# Patient Record
Sex: Female | Born: 1955 | Race: White | Hispanic: No | Smoking: Former smoker
Health system: Southern US, Community
[De-identification: ages and names within clinical notes are randomized; demographics above are authoritative.]

## PROBLEM LIST (undated history)

## (undated) DIAGNOSIS — H269 Unspecified cataract: Secondary | ICD-10-CM

## (undated) DIAGNOSIS — F419 Anxiety disorder, unspecified: Secondary | ICD-10-CM

## (undated) DIAGNOSIS — G5603 Carpal tunnel syndrome, bilateral upper limbs: Secondary | ICD-10-CM

## (undated) DIAGNOSIS — K219 Gastro-esophageal reflux disease without esophagitis: Secondary | ICD-10-CM

## (undated) DIAGNOSIS — Z8711 Personal history of peptic ulcer disease: Secondary | ICD-10-CM

## (undated) DIAGNOSIS — C50919 Malignant neoplasm of unspecified site of unspecified female breast: Secondary | ICD-10-CM

## (undated) DIAGNOSIS — M545 Low back pain, unspecified: Secondary | ICD-10-CM

## (undated) DIAGNOSIS — M1712 Unilateral primary osteoarthritis, left knee: Secondary | ICD-10-CM

## (undated) DIAGNOSIS — L723 Sebaceous cyst: Secondary | ICD-10-CM

## (undated) DIAGNOSIS — Z803 Family history of malignant neoplasm of breast: Secondary | ICD-10-CM

## (undated) DIAGNOSIS — N649 Disorder of breast, unspecified: Secondary | ICD-10-CM

## (undated) DIAGNOSIS — Z9889 Other specified postprocedural states: Secondary | ICD-10-CM

## (undated) DIAGNOSIS — J189 Pneumonia, unspecified organism: Secondary | ICD-10-CM

## (undated) DIAGNOSIS — G629 Polyneuropathy, unspecified: Secondary | ICD-10-CM

## (undated) DIAGNOSIS — I10 Essential (primary) hypertension: Secondary | ICD-10-CM

## (undated) DIAGNOSIS — H2589 Other age-related cataract: Secondary | ICD-10-CM

## (undated) DIAGNOSIS — G8929 Other chronic pain: Secondary | ICD-10-CM

## (undated) DIAGNOSIS — R112 Nausea with vomiting, unspecified: Secondary | ICD-10-CM

## (undated) DIAGNOSIS — M25511 Pain in right shoulder: Secondary | ICD-10-CM

## (undated) DIAGNOSIS — M1990 Unspecified osteoarthritis, unspecified site: Secondary | ICD-10-CM

## (undated) DIAGNOSIS — Z8719 Personal history of other diseases of the digestive system: Secondary | ICD-10-CM

## (undated) DIAGNOSIS — M8580 Other specified disorders of bone density and structure, unspecified site: Secondary | ICD-10-CM

## (undated) DIAGNOSIS — Z800 Family history of malignant neoplasm of digestive organs: Secondary | ICD-10-CM

## (undated) DIAGNOSIS — M810 Age-related osteoporosis without current pathological fracture: Secondary | ICD-10-CM

## (undated) HISTORY — PX: FOOT SURGERY: SHX648

## (undated) HISTORY — DX: Disorder of breast, unspecified: N64.9

## (undated) HISTORY — DX: Age-related osteoporosis without current pathological fracture: M81.0

## (undated) HISTORY — DX: Family history of malignant neoplasm of digestive organs: Z80.0

## (undated) HISTORY — DX: Malignant neoplasm of unspecified site of unspecified female breast: C50.919

## (undated) HISTORY — PX: KNEE ARTHROSCOPY: SHX127

## (undated) HISTORY — DX: Essential (primary) hypertension: I10

## (undated) HISTORY — PX: TUBAL LIGATION: SHX77

## (undated) HISTORY — DX: Other age-related cataract: H25.89

## (undated) HISTORY — DX: Pain in right shoulder: M25.511

## (undated) HISTORY — DX: Unspecified cataract: H26.9

## (undated) HISTORY — DX: Unilateral primary osteoarthritis, left knee: M17.12

## (undated) HISTORY — DX: Carpal tunnel syndrome, bilateral upper limbs: G56.03

## (undated) HISTORY — DX: Family history of malignant neoplasm of breast: Z80.3

## (undated) HISTORY — DX: Sebaceous cyst: L72.3

---

## 1976-02-17 DIAGNOSIS — J189 Pneumonia, unspecified organism: Secondary | ICD-10-CM

## 1976-02-17 HISTORY — DX: Pneumonia, unspecified organism: J18.9

## 2000-10-07 ENCOUNTER — Ambulatory Visit (HOSPITAL_COMMUNITY): Admission: RE | Admit: 2000-10-07 | Discharge: 2000-10-07 | Payer: Self-pay | Admitting: Family Medicine

## 2000-10-07 ENCOUNTER — Encounter: Payer: Self-pay | Admitting: Family Medicine

## 2000-10-11 ENCOUNTER — Ambulatory Visit (HOSPITAL_COMMUNITY): Admission: RE | Admit: 2000-10-11 | Discharge: 2000-10-11 | Payer: Self-pay | Admitting: Family Medicine

## 2000-10-11 ENCOUNTER — Encounter: Payer: Self-pay | Admitting: Family Medicine

## 2001-03-30 ENCOUNTER — Other Ambulatory Visit: Admission: RE | Admit: 2001-03-30 | Discharge: 2001-03-30 | Payer: Self-pay | Admitting: Obstetrics and Gynecology

## 2001-03-31 ENCOUNTER — Encounter: Payer: Self-pay | Admitting: Obstetrics and Gynecology

## 2001-03-31 ENCOUNTER — Ambulatory Visit (HOSPITAL_COMMUNITY): Admission: RE | Admit: 2001-03-31 | Discharge: 2001-03-31 | Payer: Self-pay | Admitting: Obstetrics and Gynecology

## 2001-07-14 ENCOUNTER — Encounter: Payer: Self-pay | Admitting: Internal Medicine

## 2001-07-14 ENCOUNTER — Ambulatory Visit (HOSPITAL_COMMUNITY): Admission: RE | Admit: 2001-07-14 | Discharge: 2001-07-14 | Payer: Self-pay | Admitting: Internal Medicine

## 2001-10-28 ENCOUNTER — Ambulatory Visit (HOSPITAL_COMMUNITY): Admission: RE | Admit: 2001-10-28 | Discharge: 2001-10-28 | Payer: Self-pay | Admitting: Family Medicine

## 2001-10-28 ENCOUNTER — Encounter: Payer: Self-pay | Admitting: Family Medicine

## 2003-03-09 ENCOUNTER — Ambulatory Visit (HOSPITAL_COMMUNITY): Admission: RE | Admit: 2003-03-09 | Discharge: 2003-03-09 | Payer: Self-pay | Admitting: Obstetrics and Gynecology

## 2004-02-17 HISTORY — PX: BREAST BIOPSY: SHX20

## 2004-04-01 ENCOUNTER — Ambulatory Visit (HOSPITAL_COMMUNITY): Admission: RE | Admit: 2004-04-01 | Discharge: 2004-04-01 | Payer: Self-pay | Admitting: Obstetrics and Gynecology

## 2005-02-16 HISTORY — PX: PERIPHERALLY INSERTED CENTRAL CATHETER INSERTION: SHX2221

## 2005-02-16 HISTORY — PX: THROMBECTOMY / EMBOLECTOMY SUBCLAVIAN ARTERY: SUR1355

## 2005-02-16 HISTORY — PX: MASTECTOMY: SHX3

## 2005-02-25 ENCOUNTER — Encounter: Payer: Self-pay | Admitting: Obstetrics and Gynecology

## 2005-02-25 ENCOUNTER — Ambulatory Visit (HOSPITAL_COMMUNITY): Admission: RE | Admit: 2005-02-25 | Discharge: 2005-02-25 | Payer: Self-pay | Admitting: Obstetrics and Gynecology

## 2005-02-25 ENCOUNTER — Encounter (INDEPENDENT_AMBULATORY_CARE_PROVIDER_SITE_OTHER): Payer: Self-pay | Admitting: Diagnostic Radiology

## 2005-03-04 ENCOUNTER — Inpatient Hospital Stay (HOSPITAL_COMMUNITY): Admission: RE | Admit: 2005-03-04 | Discharge: 2005-03-06 | Payer: Self-pay | Admitting: General Surgery

## 2005-03-04 ENCOUNTER — Encounter (INDEPENDENT_AMBULATORY_CARE_PROVIDER_SITE_OTHER): Payer: Self-pay | Admitting: General Surgery

## 2005-03-13 ENCOUNTER — Encounter (HOSPITAL_COMMUNITY): Admission: RE | Admit: 2005-03-13 | Discharge: 2005-04-12 | Payer: Self-pay | Admitting: General Surgery

## 2005-03-13 ENCOUNTER — Ambulatory Visit (HOSPITAL_COMMUNITY): Payer: Self-pay | Admitting: Oncology

## 2005-03-16 ENCOUNTER — Encounter: Admission: RE | Admit: 2005-03-16 | Discharge: 2005-03-16 | Payer: Self-pay | Admitting: Oncology

## 2005-03-16 ENCOUNTER — Encounter (HOSPITAL_COMMUNITY): Admission: RE | Admit: 2005-03-16 | Discharge: 2005-04-15 | Payer: Self-pay | Admitting: Oncology

## 2005-03-25 ENCOUNTER — Ambulatory Visit (HOSPITAL_COMMUNITY): Admission: RE | Admit: 2005-03-25 | Discharge: 2005-03-25 | Payer: Self-pay | Admitting: Oncology

## 2005-04-17 ENCOUNTER — Encounter (HOSPITAL_COMMUNITY): Admission: RE | Admit: 2005-04-17 | Discharge: 2005-05-17 | Payer: Self-pay | Admitting: Oncology

## 2005-04-17 ENCOUNTER — Encounter: Admission: RE | Admit: 2005-04-17 | Discharge: 2005-04-17 | Payer: Self-pay | Admitting: Oncology

## 2005-05-01 ENCOUNTER — Ambulatory Visit (HOSPITAL_COMMUNITY): Payer: Self-pay | Admitting: Oncology

## 2005-05-21 ENCOUNTER — Encounter: Admission: RE | Admit: 2005-05-21 | Discharge: 2005-05-21 | Payer: Self-pay | Admitting: Oncology

## 2005-05-21 ENCOUNTER — Encounter (HOSPITAL_COMMUNITY): Admission: RE | Admit: 2005-05-21 | Discharge: 2005-06-20 | Payer: Self-pay | Admitting: Oncology

## 2005-06-16 ENCOUNTER — Ambulatory Visit (HOSPITAL_COMMUNITY): Payer: Self-pay | Admitting: Oncology

## 2005-06-17 ENCOUNTER — Inpatient Hospital Stay (HOSPITAL_COMMUNITY): Admission: AD | Admit: 2005-06-17 | Discharge: 2005-06-21 | Payer: Self-pay | Admitting: Oncology

## 2005-06-20 ENCOUNTER — Ambulatory Visit: Payer: Self-pay | Admitting: Hematology and Oncology

## 2005-06-22 ENCOUNTER — Encounter: Admission: RE | Admit: 2005-06-22 | Discharge: 2005-06-22 | Payer: Self-pay | Admitting: Oncology

## 2005-06-22 ENCOUNTER — Encounter (HOSPITAL_COMMUNITY): Admission: RE | Admit: 2005-06-22 | Discharge: 2005-07-22 | Payer: Self-pay | Admitting: Oncology

## 2005-07-20 ENCOUNTER — Ambulatory Visit: Payer: Self-pay | Admitting: Cardiology

## 2005-07-27 ENCOUNTER — Ambulatory Visit: Payer: Self-pay | Admitting: *Deleted

## 2005-08-03 ENCOUNTER — Ambulatory Visit: Payer: Self-pay | Admitting: *Deleted

## 2005-08-05 ENCOUNTER — Ambulatory Visit: Payer: Self-pay | Admitting: *Deleted

## 2005-08-12 ENCOUNTER — Encounter (HOSPITAL_COMMUNITY): Admission: RE | Admit: 2005-08-12 | Discharge: 2005-09-11 | Payer: Self-pay | Admitting: Oncology

## 2005-08-12 ENCOUNTER — Encounter: Admission: RE | Admit: 2005-08-12 | Discharge: 2005-08-12 | Payer: Self-pay | Admitting: Oncology

## 2005-08-12 ENCOUNTER — Ambulatory Visit: Payer: Self-pay | Admitting: *Deleted

## 2005-08-12 ENCOUNTER — Ambulatory Visit (HOSPITAL_COMMUNITY): Payer: Self-pay | Admitting: Oncology

## 2005-08-13 ENCOUNTER — Encounter (HOSPITAL_COMMUNITY): Admission: RE | Admit: 2005-08-13 | Discharge: 2005-09-12 | Payer: Self-pay | Admitting: Oncology

## 2005-09-16 ENCOUNTER — Encounter (HOSPITAL_COMMUNITY): Admission: RE | Admit: 2005-09-16 | Discharge: 2005-10-16 | Payer: Self-pay | Admitting: Oncology

## 2005-10-06 ENCOUNTER — Ambulatory Visit (HOSPITAL_COMMUNITY): Payer: Self-pay | Admitting: Oncology

## 2005-10-06 ENCOUNTER — Encounter: Admission: RE | Admit: 2005-10-06 | Discharge: 2005-10-06 | Payer: Self-pay | Admitting: Oncology

## 2005-10-28 ENCOUNTER — Ambulatory Visit (HOSPITAL_COMMUNITY): Admission: RE | Admit: 2005-10-28 | Discharge: 2005-10-28 | Payer: Self-pay | Admitting: Obstetrics and Gynecology

## 2005-11-16 ENCOUNTER — Ambulatory Visit (HOSPITAL_COMMUNITY): Admission: RE | Admit: 2005-11-16 | Discharge: 2005-11-16 | Payer: Self-pay | Admitting: Internal Medicine

## 2005-11-30 ENCOUNTER — Encounter (HOSPITAL_COMMUNITY): Admission: RE | Admit: 2005-11-30 | Discharge: 2005-12-30 | Payer: Self-pay | Admitting: Oncology

## 2005-11-30 ENCOUNTER — Ambulatory Visit (HOSPITAL_COMMUNITY): Payer: Self-pay | Admitting: Oncology

## 2005-11-30 ENCOUNTER — Encounter: Admission: RE | Admit: 2005-11-30 | Discharge: 2005-11-30 | Payer: Self-pay | Admitting: Oncology

## 2006-01-04 ENCOUNTER — Encounter (HOSPITAL_COMMUNITY): Admission: RE | Admit: 2006-01-04 | Discharge: 2006-02-03 | Payer: Self-pay | Admitting: Oncology

## 2006-02-16 HISTORY — PX: COLONOSCOPY: SHX174

## 2006-05-31 ENCOUNTER — Ambulatory Visit (HOSPITAL_COMMUNITY): Payer: Self-pay | Admitting: Oncology

## 2006-05-31 ENCOUNTER — Encounter (HOSPITAL_COMMUNITY): Admission: RE | Admit: 2006-05-31 | Discharge: 2006-06-30 | Payer: Self-pay | Admitting: Oncology

## 2006-11-10 ENCOUNTER — Ambulatory Visit (HOSPITAL_COMMUNITY): Admission: RE | Admit: 2006-11-10 | Discharge: 2006-11-10 | Payer: Self-pay | Admitting: Obstetrics and Gynecology

## 2006-11-10 ENCOUNTER — Other Ambulatory Visit: Admission: RE | Admit: 2006-11-10 | Discharge: 2006-11-10 | Payer: Self-pay | Admitting: Obstetrics and Gynecology

## 2006-11-29 ENCOUNTER — Encounter (HOSPITAL_COMMUNITY): Admission: RE | Admit: 2006-11-29 | Discharge: 2006-12-29 | Payer: Self-pay | Admitting: Oncology

## 2006-11-29 ENCOUNTER — Ambulatory Visit (HOSPITAL_COMMUNITY): Payer: Self-pay | Admitting: Oncology

## 2006-11-30 ENCOUNTER — Encounter (HOSPITAL_COMMUNITY): Admission: RE | Admit: 2006-11-30 | Discharge: 2006-12-30 | Payer: Self-pay | Admitting: Oncology

## 2006-12-31 ENCOUNTER — Encounter (HOSPITAL_COMMUNITY): Admission: RE | Admit: 2006-12-31 | Discharge: 2007-01-30 | Payer: Self-pay | Admitting: Oncology

## 2007-01-24 ENCOUNTER — Ambulatory Visit (HOSPITAL_COMMUNITY): Payer: Self-pay | Admitting: Oncology

## 2007-01-24 ENCOUNTER — Encounter (HOSPITAL_COMMUNITY): Admission: RE | Admit: 2007-01-24 | Discharge: 2007-02-16 | Payer: Self-pay | Admitting: Oncology

## 2007-02-14 ENCOUNTER — Ambulatory Visit: Payer: Self-pay | Admitting: Internal Medicine

## 2007-02-14 ENCOUNTER — Ambulatory Visit (HOSPITAL_COMMUNITY): Admission: RE | Admit: 2007-02-14 | Discharge: 2007-02-14 | Payer: Self-pay | Admitting: Internal Medicine

## 2007-02-16 ENCOUNTER — Ambulatory Visit (HOSPITAL_COMMUNITY): Admission: RE | Admit: 2007-02-16 | Discharge: 2007-02-16 | Payer: Self-pay | Admitting: Obstetrics & Gynecology

## 2007-03-21 ENCOUNTER — Ambulatory Visit (HOSPITAL_COMMUNITY): Admission: RE | Admit: 2007-03-21 | Discharge: 2007-03-21 | Payer: Self-pay | Admitting: Obstetrics and Gynecology

## 2007-05-11 ENCOUNTER — Ambulatory Visit: Payer: Self-pay

## 2007-05-30 ENCOUNTER — Encounter (HOSPITAL_COMMUNITY): Admission: RE | Admit: 2007-05-30 | Discharge: 2007-06-29 | Payer: Self-pay | Admitting: Oncology

## 2007-05-30 ENCOUNTER — Ambulatory Visit (HOSPITAL_COMMUNITY): Payer: Self-pay | Admitting: Oncology

## 2007-05-31 ENCOUNTER — Ambulatory Visit (HOSPITAL_COMMUNITY): Admission: RE | Admit: 2007-05-31 | Discharge: 2007-05-31 | Payer: Self-pay | Admitting: Anesthesiology

## 2007-07-20 ENCOUNTER — Ambulatory Visit (HOSPITAL_COMMUNITY): Payer: Self-pay | Admitting: Oncology

## 2007-07-20 ENCOUNTER — Encounter (HOSPITAL_COMMUNITY): Admission: RE | Admit: 2007-07-20 | Discharge: 2007-08-19 | Payer: Self-pay | Admitting: Oncology

## 2008-01-24 ENCOUNTER — Ambulatory Visit (HOSPITAL_COMMUNITY): Payer: Self-pay | Admitting: Oncology

## 2008-01-24 ENCOUNTER — Other Ambulatory Visit: Admission: RE | Admit: 2008-01-24 | Discharge: 2008-01-24 | Payer: Self-pay | Admitting: Obstetrics and Gynecology

## 2008-01-24 ENCOUNTER — Encounter (HOSPITAL_COMMUNITY): Admission: RE | Admit: 2008-01-24 | Discharge: 2008-02-14 | Payer: Self-pay | Admitting: Oncology

## 2008-03-14 ENCOUNTER — Encounter (HOSPITAL_COMMUNITY): Admission: RE | Admit: 2008-03-14 | Discharge: 2008-04-13 | Payer: Self-pay | Admitting: Oncology

## 2008-06-25 ENCOUNTER — Ambulatory Visit (HOSPITAL_COMMUNITY): Admission: RE | Admit: 2008-06-25 | Discharge: 2008-06-25 | Payer: Self-pay | Admitting: Anesthesiology

## 2008-07-24 ENCOUNTER — Ambulatory Visit (HOSPITAL_COMMUNITY): Payer: Self-pay | Admitting: Oncology

## 2008-10-24 ENCOUNTER — Encounter: Admission: RE | Admit: 2008-10-24 | Discharge: 2008-10-24 | Payer: Self-pay | Admitting: Anesthesiology

## 2009-01-21 ENCOUNTER — Encounter (HOSPITAL_COMMUNITY): Admission: RE | Admit: 2009-01-21 | Discharge: 2009-02-13 | Payer: Self-pay | Admitting: Oncology

## 2009-01-21 ENCOUNTER — Ambulatory Visit (HOSPITAL_COMMUNITY): Payer: Self-pay | Admitting: Oncology

## 2009-03-15 ENCOUNTER — Ambulatory Visit (HOSPITAL_COMMUNITY): Admission: RE | Admit: 2009-03-15 | Discharge: 2009-03-15 | Payer: Self-pay | Admitting: Anesthesiology

## 2009-03-18 ENCOUNTER — Encounter (HOSPITAL_COMMUNITY): Admission: RE | Admit: 2009-03-18 | Discharge: 2009-04-17 | Payer: Self-pay | Admitting: Oncology

## 2009-03-21 ENCOUNTER — Ambulatory Visit (HOSPITAL_COMMUNITY): Payer: Self-pay | Admitting: Oncology

## 2009-03-21 ENCOUNTER — Other Ambulatory Visit: Admission: RE | Admit: 2009-03-21 | Discharge: 2009-03-21 | Payer: Self-pay | Admitting: Obstetrics and Gynecology

## 2009-04-01 ENCOUNTER — Ambulatory Visit (HOSPITAL_COMMUNITY): Admission: RE | Admit: 2009-04-01 | Discharge: 2009-04-01 | Payer: Self-pay | Admitting: Obstetrics and Gynecology

## 2009-07-22 ENCOUNTER — Ambulatory Visit (HOSPITAL_COMMUNITY): Payer: Self-pay | Admitting: Oncology

## 2009-10-26 ENCOUNTER — Encounter: Admission: RE | Admit: 2009-10-26 | Discharge: 2009-10-26 | Payer: Self-pay | Admitting: Neurosurgery

## 2009-11-07 ENCOUNTER — Encounter (HOSPITAL_COMMUNITY): Admission: RE | Admit: 2009-11-07 | Discharge: 2010-01-17 | Payer: Self-pay | Admitting: Neurosurgery

## 2009-12-29 ENCOUNTER — Encounter: Admission: RE | Admit: 2009-12-29 | Discharge: 2009-12-29 | Payer: Self-pay | Admitting: Anesthesiology

## 2010-01-31 ENCOUNTER — Ambulatory Visit (HOSPITAL_COMMUNITY): Payer: Self-pay | Admitting: Oncology

## 2010-01-31 ENCOUNTER — Encounter (HOSPITAL_COMMUNITY)
Admission: RE | Admit: 2010-01-31 | Discharge: 2010-03-02 | Payer: Self-pay | Source: Home / Self Care | Attending: Oncology | Admitting: Oncology

## 2010-02-21 ENCOUNTER — Ambulatory Visit (HOSPITAL_COMMUNITY)
Admission: RE | Admit: 2010-02-21 | Discharge: 2010-02-21 | Payer: Self-pay | Source: Home / Self Care | Attending: Family Medicine | Admitting: Family Medicine

## 2010-03-09 ENCOUNTER — Encounter: Payer: Self-pay | Admitting: Internal Medicine

## 2010-03-14 ENCOUNTER — Other Ambulatory Visit: Payer: Self-pay | Admitting: Obstetrics and Gynecology

## 2010-03-14 DIAGNOSIS — Z139 Encounter for screening, unspecified: Secondary | ICD-10-CM

## 2010-04-03 ENCOUNTER — Ambulatory Visit (HOSPITAL_COMMUNITY): Payer: Managed Care, Other (non HMO)

## 2010-04-03 ENCOUNTER — Ambulatory Visit (HOSPITAL_COMMUNITY)
Admission: RE | Admit: 2010-04-03 | Discharge: 2010-04-03 | Disposition: A | Discharge status: Home / Self Care | Payer: Managed Care, Other (non HMO) | Source: Ambulatory Visit | Attending: Obstetrics and Gynecology | Admitting: Obstetrics and Gynecology

## 2010-04-03 DIAGNOSIS — Z1231 Encounter for screening mammogram for malignant neoplasm of breast: Secondary | ICD-10-CM | POA: Insufficient documentation

## 2010-04-03 DIAGNOSIS — Z139 Encounter for screening, unspecified: Secondary | ICD-10-CM

## 2010-04-28 LAB — DIFFERENTIAL
Basophils Absolute: 0 10*3/uL (ref 0.0–0.1)
Basophils Relative: 0 % (ref 0–1)
Eosinophils Absolute: 0.3 10*3/uL (ref 0.0–0.7)
Monocytes Relative: 6 % (ref 3–12)
Neutro Abs: 7.2 10*3/uL (ref 1.7–7.7)
Neutrophils Relative %: 63 % (ref 43–77)

## 2010-04-28 LAB — COMPREHENSIVE METABOLIC PANEL
AST: 27 U/L (ref 0–37)
Calcium: 9.5 mg/dL (ref 8.4–10.5)
Sodium: 140 mEq/L (ref 135–145)

## 2010-04-28 LAB — CBC
HCT: 38 % (ref 36.0–46.0)
MCH: 29.3 pg (ref 26.0–34.0)
MCHC: 33.2 g/dL (ref 30.0–36.0)
MCV: 88.4 fL (ref 78.0–100.0)
RDW: 12.8 % (ref 11.5–15.5)
WBC: 11.4 10*3/uL — ABNORMAL HIGH (ref 4.0–10.5)

## 2010-04-28 LAB — SEDIMENTATION RATE: Sed Rate: 25 mm/hr — ABNORMAL HIGH (ref 0–22)

## 2010-05-07 LAB — LIPID PANEL
LDL Cholesterol: 114 mg/dL — ABNORMAL HIGH (ref 0–99)
Triglycerides: 100 mg/dL (ref <150)
VLDL: 20 mg/dL (ref 0–40)

## 2010-05-07 LAB — COMPREHENSIVE METABOLIC PANEL
AST: 26 U/L (ref 0–37)
Albumin: 4.2 g/dL (ref 3.5–5.2)
Alkaline Phosphatase: 76 U/L (ref 39–117)
Chloride: 100 mEq/L (ref 96–112)
GFR calc Af Amer: >60 mL/min (ref >60)
Potassium: 3.3 mEq/L — ABNORMAL LOW (ref 3.5–5.1)
Sodium: 137 mEq/L (ref 135–145)
Total Bilirubin: 0.4 mg/dL (ref 0.3–1.2)
Total Protein: 7.4 g/dL (ref 6.0–8.3)

## 2010-05-07 LAB — ANA: Anti Nuclear Antibody(ANA): NEGATIVE

## 2010-05-07 LAB — CBC
Platelets: 252 10*3/uL (ref 150–400)
RDW: 13.1 % (ref 11.5–15.5)
WBC: 10.4 10*3/uL (ref 4.0–10.5)

## 2010-05-07 LAB — SEDIMENTATION RATE: Sed Rate: 28 mm/hr — ABNORMAL HIGH (ref 0–22)

## 2010-05-07 LAB — VITAMIN D 1,25 DIHYDROXY
Vitamin D 1, 25 (OH)2 Total: 61 pg/mL (ref 18–72)
Vitamin D2 1, 25 (OH)2: <8 pg/mL
Vitamin D3 1, 25 (OH)2: 61 pg/mL

## 2010-05-20 LAB — CBC
HCT: 39.6 % (ref 36.0–46.0)
Hemoglobin: 13.3 g/dL (ref 12.0–15.0)
MCV: 89.5 fL (ref 78.0–100.0)
Platelets: 288 10*3/uL (ref 150–400)
WBC: 9.7 10*3/uL (ref 4.0–10.5)

## 2010-05-20 LAB — COMPREHENSIVE METABOLIC PANEL
Alkaline Phosphatase: 83 U/L (ref 39–117)
BUN: 14 mg/dL (ref 6–23)
Chloride: 101 mEq/L (ref 96–112)
Creatinine, Ser: 0.76 mg/dL (ref 0.4–1.2)
GFR calc non Af Amer: >60 mL/min (ref >60)
Glucose, Bld: 120 mg/dL — ABNORMAL HIGH (ref 70–99)
Potassium: 3.2 mEq/L — ABNORMAL LOW (ref 3.5–5.1)
Total Bilirubin: 0.4 mg/dL (ref 0.3–1.2)

## 2010-05-20 LAB — DIFFERENTIAL
Basophils Absolute: 0.1 10*3/uL (ref 0.0–0.1)
Basophils Relative: 1 % (ref 0–1)
Lymphocytes Relative: 25 % (ref 12–46)
Neutro Abs: 6.5 10*3/uL (ref 1.7–7.7)
Neutrophils Relative %: 67 % (ref 43–77)

## 2010-05-26 ENCOUNTER — Other Ambulatory Visit: Payer: Self-pay | Admitting: Adult Health

## 2010-05-26 ENCOUNTER — Other Ambulatory Visit (HOSPITAL_COMMUNITY)
Admission: RE | Admit: 2010-05-26 | Discharge: 2010-05-26 | Disposition: A | Discharge status: Home / Self Care | Payer: Managed Care, Other (non HMO) | Source: Ambulatory Visit | Attending: Obstetrics and Gynecology | Admitting: Obstetrics and Gynecology

## 2010-05-26 DIAGNOSIS — Z01419 Encounter for gynecological examination (general) (routine) without abnormal findings: Secondary | ICD-10-CM | POA: Insufficient documentation

## 2010-07-01 NOTE — Op Note (Signed)
NAME:  Claudia Murphy, Claudia Murphy                 ACCOUNT NO.:  [id number]   MEDICAL RECORD NO.:  [id number]          PATIENT TYPE:  AMB   LOCATION:  DAY                           FACILITY:  [facility]   PHYSICIAN:  R. Roetta Sessions, M.D. DATE OF BIRTH:  1955-04-16   DATE OF PROCEDURE:  02/14/2007  DATE OF DISCHARGE:                               OPERATIVE REPORT   INDICATIONS FOR PROCEDURE:  A 55 year old lady referred out of the  courtesy of Cyril Mourning, FNP, for colorectal cancer screening.  Her  last colonoscopy was about nine years ago.  I have performed it.  She  had diverticulosis and hemorrhoids at that time and was done for  Hemoccult positive stool.  Although she recalls she had polyps, there is  no documentation she has ever had polyps previously.  No family history  colon cancer, although there is a personal history of breast cancer.  She currently does not have any lower GI tract symptoms.  Colonoscopy is  done primarily for screening reasons.  This approach has been discussed  with the patient at length.  Potential risks, benefits and alternative  have been reviewed, questions answered, she is agreeable.  Please see  documentation on the medical record.   PROCEDURE NOTE:  O2 saturation, blood pressure, pulse and respirations  were monitor throughout the entire procedure.   CONSCIOUS SEDATION:  Versed 4 mg IV, Demerol 100 mg IV in divided doses.   INSTRUMENT:  Pentax video chip system.   FINDINGS:  Digital rectal exam revealed no abnormalities.   ENDOSCOPIC FINDINGS:  The prep was adequate.   Colon:  Colonic mucosa was surveyed from the rectosigmoid junction  through the left transverse and right colon, the area of the appendiceal  orifice, ileocecal valve and cecum.  These structures were well seen and  photographed for the record.  Terminal ileum was intubated to 10 cm.  From this level, the scope was slowly and cautiously withdrawn.  All  previously mentioned mucosal surfaces  were again seen.  Using a  combination of tip flexion for fold flattening and careful observation,  revealed a sigmoid diverticulum.  The remainder of colonic mucosa and  terminal ileal mucosa appeared normal.  Scope was pulled down in the  rectum where thorough examination of the rectal mucosa including  retroflexed view of the anal verge and __________ view of the anal canal  demonstrated __________  hemorrhoids and single anal papilla only.  The  patient tolerated the procedure well as reactive to endoscopy.   IMPRESSION:  Anal papilla and hemorrhoids, otherwise normal rectum,  sigmoid diverticula.  Remainder of colonic mucosa and terminal ileal  mucosa appeared normal.   RECOMMENDATIONS:  1. Diverticulosis literature provided to [name].  2. Consider repeat screening colonoscopy in 5-10 years.      Jonathon Bellows, M.D.  Electronically Signed     RMR/MEDQ  D:  02/14/2007  T:  02/14/2007  Job:  [id number]   cc:   Cyril Mourning, FNP  [facility]

## 2010-07-04 NOTE — Procedures (Signed)
NAME:  Claudia Murphy, Claudia Murphy                 ACCOUNT NO.:  [id number]   MEDICAL RECORD NO.:  [id number]           PATIENT TYPE:   LOCATION:                                 FACILITY:   PHYSICIAN:  Dani Gobble, MD       DATE OF BIRTH:  [birth year]   DATE OF PROCEDURE:  03/16/2005  DATE OF DISCHARGE:                                  ECHOCARDIOGRAM   INDICATIONS:  A 55 year old female with past medical history of breast  cancer who is referred for echocardiogram to evaluate LV function prior to  chemotherapy.   The technical quality of this study is quite limited secondary to patient  body habitus and poor acoustic windows.   The aorta is not well seen but appears grossly normal in size.   The left atrium also appears grossly normal in size. The patient appeared to  be in sinus rhythm/sinus tachycardia during the procedure.   The interventricular septum and posterior wall are not well visualized but  subjectively appeared to be normal in thickness.   The aortic valve is not well visualized. Overall leaflet excursion is  probably reasonable. Peak velocity across the aortic valve is 1.6 meters per  second corresponding to a mean gradient of 8 mmHg.   The mitral valve appeared grossly structurally normal.   Pulmonic valve was not visualized.   Tricuspid valve was poorly visualized as well.   This study is not adequate for assessment of regurgitation valvular lesions.   The left ventricle appears grossly normal in size. Overall left ventricular  systolic function is probably preserved. However, the endocardium is not  well visualized. No comment can be made regarding regional wall motion.   IMPRESSION:  1.  Technically limited study secondary to patient body habitus and poor      acoustic windows.  2.  Left ventricle appears grossly normal in size, and systolic function is      probably preserved, but this is not well appreciated. No comment can be      made regarding regional wall  motion.  3.  The aortic valve is not well visualized, but there appears to be a very      minimal increased velocity and mean gradient across this valve. I do not      appreciate a significant stenosis, either by Doppler or visually.  4.  No comment can be made regarding the possibility of regurgitant valvular      lesions on this study.  5.  Consider transesophageal echocardiogram or MUGA for LV function if      clinically indicated prior to chemotherapy.           ______________________________  Dani Gobble, MD     AB/MEDQ  D:  03/16/2005  T:  03/17/2005  Job:  [id number]   cc:   [name]. Mariel Sleet, MD  Fax: [phone]

## 2010-07-04 NOTE — H&P (Signed)
NAME:  Claudia Murphy, Claudia Murphy                 ACCOUNT NO.:  [id number]   MEDICAL RECORD NO.:  [id number]          PATIENT TYPE:  AMB   LOCATION:  DAY                           FACILITY:  [facility]   PHYSICIAN:  Dalia Heading, M.D.  DATE OF BIRTH:  20-Apr-1955   DATE OF ADMISSION:  02/02/2005  DATE OF DISCHARGE:  12/18/2006LH                                HISTORY & PHYSICAL   CHIEF COMPLAINT:  Left breast carcinoma.   HISTORY OF PRESENT ILLNESS:  The patient is a 55 year old white female who  is referred for evaluation and treatment of a left breast mass.  She  underwent ultrasound guided biopsy of the left breast.  Mass was found to  have a malignancy.  She did have an MRI of both breasts which revealed only  the known mass to be present in the left breast.  No other lesions were  noted.  No nipple discharge or family history of breast carcinoma is noted.   PAST MEDICAL HISTORY:  Unremarkable.   PAST SURGICAL HISTORY:  Unremarkable.   CURRENT MEDICATIONS:  Hormone supplements.   ALLERGIES:  PENICILLIN.   REVIEW OF SYSTEMS:  Noncontributory.   PHYSICAL EXAMINATION:  GENERAL APPEARANCE:  The patient is a well-developed,  well-nourished white female in no acute distress.  LUNGS:  Clear to auscultation with good breath sounds bilaterally.  HEART:  Regular rate and rhythm without S3, S4 or murmurs.  BREASTS:  Left breast examination reveals a solid, hard, dominant mass noted  at the 6 o'clock position.  No nipple discharge or dimpling is noted.  The  axilla is negative for palpable nodes.  Right breast examination reveals no  dominant mass, nipple discharge or dimpling.  The axilla is negative for  palpable nodes.   IMPRESSION:  Left breast neoplasm.  Malignant.   PLAN:  The patient is scheduled for left modified radical mastectomy on  March 04, 2005.  The risks and benefits of the procedure including  bleeding, infection, pain, possibility of left arm swelling were fully  explained to the  patient.  Gave informed consent.      Dalia Heading, M.D.  Electronically Signed     MAJ/MEDQ  D:  02/27/2005  T:  02/27/2005  Job:  [id number]   cc:   Tilda Burrow, M.D.  Fax: [phone]   Madelin Rear. Sherwood Gambler, MD  Fax: [phone]

## 2010-07-04 NOTE — Discharge Summary (Signed)
NAME:  Claudia, Murphy                 ACCOUNT NO.:  [id number]   MEDICAL RECORD NO.:  [id number]          PATIENT TYPE:  INP   LOCATION:  2918                         FACILITY:  [facility]   PHYSICIAN:  D. Oley Balm, M.D.DATE OF BIRTH:  Dec 02, 1955   DATE OF ADMISSION:  06/17/2005  DATE OF DISCHARGE:  06/21/2005                                 DISCHARGE SUMMARY   Claudia Murphy is a 55 year old white female referred to the interventional  radiology service for further evaluation of an acute right  subclavian/axillary DVT.  The patient was seen initially on Jun 16, 2005, by  Dr. Mariel Sleet secondary to right upper extremity swelling and edema.  She  was subsequently found to have a central occlusive right upper extremity DVT  with an acute appearance and extension into the cephalic-subclavian venous  junction on venous Doppler.   PAST MEDICAL HISTORY:  Significant for a left breast carcinoma diagnosed in  January 2007 with subsequent left radical mastectomy.  A right upper  extremity PICC line was also placed on March 25, 2005, for chemotherapy,  which the patient completed about two weeks ago.  The PICC was subsequently  removed after chemotherapy was completed.   On Jun 17, 2005, the patient underwent right upper extremity/superior vena  cava venography demonstrating an acute thrombotic subclavian venous  occlusion (with symptomatic right upper extremity swelling and edema).  Catheter directed  thrombolysis was then initiated with tenecteplase (TNK)  at a rate of 40 mL/hr.  In addition to and secondary to poor IV access, a  left double-lumen IJ Hohn catheter was inserted for IV heparin and lab draws  while receiving thrombolytic therapy.  Pharmacy was consulted for further  management of anticoagulation therapy.  The patient tolerated the  thrombolytic initiation phase without difficulty.  Follow-up right upper  extremity venography on Jun 18, 2005, revealed minimal improvement in the  right  subclavian axillary vein thrombus.  The patient was also noted to have  a minimal right upper extremity hematoma and treated with compression  dressings to the site.  Morphine PCA was utilized for pain control and  TNK/IV heparin were ordered to resume.  Unfortunately, nursing failed to  resume the TNK infusion secondary to miscommunication.  Following the  clarification of orders, TNK was resumed at a rate of 0.4 mg/hr. along with  IV heparin.  The patient did experience episodes of oozing blood from both  the left IJ and right upper extremity venous catheter insertion sites and  was treated with Gelfoam and pressure dressings as well as temporary  discontinuance of TNK.  Follow-up venography on Jun 20, 2005, demonstrated  clearance of clot from the central aspect of the right subclavian vein with  persistent occlusion of the right axillary vein extending to the lateral  aspect of the right subclavian vein.  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  TNK was subsequently  discontinued and IV heparin resumed and the patient was returned to the  floor  in stable condition with instructions to keep her right arm elevated  above the right atrium to expedite good venous return.   The admitting lab values on Jun 17, 2005, revealed a PT of 13.7, INR of 1.0,  PTT 25.  Sodium 144, potassium 3.7, creatinine 0.7.  Hemoglobin 8.9.  Platelets 281,000.  Fibrinogen 341.  White blood cells 7.3.  Post  thrombolytic therapy hemoglobin was 7.5 and platelets 243,000.  Lauretta I.  Odogwu, M.D., was consulted for assistance with anticoagulation management  and medical follow-up for anemia.  The patient was subsequently transitioned  to Lovenox-Coumadin therapy per pharmacy protocol and per Dr. Lonell Face  recommendations as well as transfused two units of packed red blood cells on  Jun 20, 2005.   Lovenox was initiated at a dose of 80 mg subcutaneous q.12h.  and Coumadin 5 mg p.o. daily was initiated.  Lovenox teaching was performed.  Follow-up hemoglobin on Jun 21, 2005, was 10.1, platelet count 225,000.  PT  17.1, INR 1.4.   On the day of discharge the patient is doing well with slightly decreased  right upper extremity swelling and no significant right upper chest  discomfort.  Vital signs were stable and the patient was afebrile.  The  patient was given prescriptions for Lovenox 80 mg subcu b.i.d. (seven doses)  and Coumadin 4 mg p.o. daily (#5).  She will continue her current home  medications of:   1.  Prevacid 30 mg p.o. daily.  2.  Ativan 1 mg p.o. as needed q.4-6h.   The patient was instructed to follow up with Dr. Mariel Sleet on May 7 or 8,  2007, for further management of anticoagulation and medical management.  She  was also instructed to avoid heavy lifting or driving and to keep the right  arm elevated at home as much as possible to expedite venous drainage and  minimize swelling.  She was given contact numbers for interventional  radiology to inform of [place] any questions or concerns.      D. Peri Maris, P.A.    ______________________________  D. Oley Balm, M.D.    DKA/MEDQ  D:  06/21/2005  T:  06/22/2005  Job:  [id number]   cc:   [name]. Mariel Sleet, MD  Fax: [phone]   Lauretta I. Odogwu, M.D.  Fax: [phone]

## 2010-07-04 NOTE — Consult Note (Signed)
NAME:  Claudia Murphy, Claudia Murphy                 ACCOUNT NO.:  [id number]   MEDICAL RECORD NO.:  [id number]            PATIENT TYPE:   LOCATION:                                 FACILITY:   PHYSICIAN:  Lauretta I. Odogwu, M.D.DATE OF BIRTH:  Jun 08, 1955   DATE OF CONSULTATION:  06/20/2005  DATE OF DISCHARGE:                                   CONSULTATION   CONSULTING PHYSICIAN:  Dr. Judie Petit. Ruel Favors.   REASON FOR CONSULTATION:  Management of anticoagulation.   FINDINGS:  The patient is a 55 year old woman with history of left breast  cancer who had undergone a left mastectomy and recently completed adjuvant  chemotherapy.  She presented to [facility] on [facility], 2005 with swelling of  the right arm and chest wall engorgement.  An ultrasound demonstrated right  axillary and subclavian DVT.  She was admitted to interventional radiology  service and began thrombolytic therapy with TNKase with complete resolution  of the subclavian and axillary vein blood clots.  The patient had oozing  from IJ site early this morning which has decreased in intensity.  Besides  baseline fatigue, she has no other complaints.  She denies shortness of  breath, cough, and hemoptysis.   PAST MEDICAL HISTORY:  1.  Status post left mastectomy followed by adjuvant chemotherapy.  2.  Recently diagnosed with right axillary and subclavian vein thrombosis.   MEDICATIONS IN-HOUSE:  1.  Morphine PCA.  2.  gtt Heparin.  3.  Vicodin 1-2 q.4h. p.r.n. for pain.  4.  Ambien 10 mg q.h.s.  5.  Zofran p.r.n. for nausea.  6.  Benadryl p.r.n.   ALLERGIES:  The patient is allergic to PENICILLIN.   SOCIAL HISTORY:  The patient is married.  She works with [employer].  She was  a previous smoker, gave up in January of 2006 and had smoked one pack for 30  years.  She denies a history of alcohol use.   FAMILY HISTORY:  A maternal aunt had breast cancer with bone metastasis.   REVIEW OF SYSTEMS:  Twelve-point systems review essentially  negative.   PHYSICAL EXAMINATION:  GENERAL:  Patient is a well-appearing and well-  nourished woman, in no distress.  VITAL SIGNS:  Pulse 88, blood pressure 135/60, temperature 98.6,  respirations 15, sats 96%.  HEENT:  Head is atraumatic, normocephalic.  Sclerae is anicteric.  Pupils  equal, round and reactive to light.  Mouth moist without ulceration, thrush,  lesions.  NECK:  Supple without adenopathy.  CHEST:  Reveals good air entry bilaterally and is clear to both percussion  and auscultation.  CARDIOVASCULAR:  Exam reveals first and second heart sounds to be present,  no other sounds or murmurs.  BREASTS:  Left mastectomy scar appears well healed.  ABDOMEN:  Soft, nontender with no hepatosplenomegaly and bowel sounds  present.  EXTREMITIES:  Reveal no edema, pulses present and symmetrical.  NEUROLOGIC:  No focal deficits.   LABORATORIES:  CBC: White cell count 8.6, hemoglobin 7.5, hematocrit 22.2,  platelets 243.   IMPRESSION AND PLAN:  1.  A 55 year old woman  with an acute right axillary and subclavian deep      vein thrombosis, status post completion of clot thrombolysis with      TNKase.  No bleeding from right IJ site.  Patient has had a past history      of breast cancer and will require long-term anticoagulation.  Would      recommend transitioning from heparin to Lovenox in readiness for      discharge.  Can also begin oral Coumadin this evening.  We will ask      pharmacy to help with dose adjustments.  2.  Anemia.  The patient will benefit from 2 units of packed red blood      cells, so we will type and cross and transfuse to 2 units.  3.  History of left breast cancer, status post left mastectomy.  Completed 6      months of adjuvant chemotherapy.   Thank you for consult, we will follow with you.      Lauretta I. Odogwu, M.D.  Electronically Signed     LIO/MEDQ  D:  06/20/2005  T:  06/20/2005  Job:  [id number]   cc:   [name]. Mariel Sleet, MD  Fax: [phone]

## 2010-07-30 ENCOUNTER — Encounter (HOSPITAL_COMMUNITY): Payer: Self-pay | Admitting: Oncology

## 2010-07-30 ENCOUNTER — Encounter (HOSPITAL_COMMUNITY): Payer: Self-pay | Admitting: *Deleted

## 2010-08-01 ENCOUNTER — Ambulatory Visit (HOSPITAL_COMMUNITY): Payer: Self-pay | Admitting: Oncology

## 2010-08-13 ENCOUNTER — Other Ambulatory Visit (HOSPITAL_COMMUNITY): Payer: Self-pay | Admitting: Oncology

## 2010-08-13 ENCOUNTER — Encounter (HOSPITAL_COMMUNITY): Payer: Self-pay | Admitting: Oncology

## 2010-08-13 DIAGNOSIS — C50919 Malignant neoplasm of unspecified site of unspecified female breast: Secondary | ICD-10-CM

## 2010-08-13 HISTORY — DX: Malignant neoplasm of unspecified site of unspecified female breast: C50.919

## 2010-08-27 ENCOUNTER — Encounter (HOSPITAL_COMMUNITY): Payer: Managed Care, Other (non HMO) | Attending: Oncology | Admitting: Oncology

## 2010-08-27 VITALS — BP 142/85 | HR 90 | Temp 98.2°F | Wt 180.2 lb

## 2010-08-27 DIAGNOSIS — C50919 Malignant neoplasm of unspecified site of unspecified female breast: Secondary | ICD-10-CM

## 2010-08-27 DIAGNOSIS — G8928 Other chronic postprocedural pain: Secondary | ICD-10-CM

## 2010-08-27 NOTE — Patient Instructions (Signed)
[facility]  Discharge Instructions  RECOMMENDATIONS MADE BY THE CONSULTANT AND ANY TEST RESULTS WILL BE SENT TO YOUR REFERRING DOCTOR.   EXAM FINDINGS BY MD TODAY AND SIGNS AND SYMPTOMS TO REPORT TO CLINIC OR PRIMARY MD: Exam good   MEDICATIONS PRESCRIBED: none   INSTRUCTIONS GIVEN AND DISCUSSED:  SPECIAL INSTRUCTIONS/FOLLOW-UP: Other (Referral/Appointments) one year return   I acknowledge that I have been informed and understand all the instructions given to me and received a copy. I do not have any more questions at this time, but understand that I may call the Specialty Clinic at [facility] at [phone] during business hours should I have any further questions or need assistance in obtaining follow-up care.    __________________________________________  _____________  __________ Signature of Patient or Authorized Representative            Date                   Time    __________________________________________ Nurse's Signature

## 2010-08-27 NOTE — Progress Notes (Signed)
CC:   Claudia Murphy. Claudia Gambler, MD Claudia Murphy. Claudia Murphy, M.D. Claudia Murphy, M.D.  DIAGNOSES: 1. 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  But with the 6th cycle, methotrexate was     substituted for the epirubicin due to skin toxicity.  Her     mastectomy was on 03/04/2005 and she finished all chemotherapy as     of 06/03/2005.  Thus far, she has no evidence of recurrence. 2. 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 Hadassah Murphy that she needs a left knee replacement at     some point in the near future. 3. Left plantar fasciitis.  She is seeing Dr. Charlsie Murphy  and is now in an     Radio broadcast assistant. 4. 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  She was seen in consultation by Dr. Jerilee Murphy in the past, but no further therapy was recommended. 5. 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 6. Osteoporosis. 7. History of colonoscopy more than 5 years ago and we have     recommended her to have repeat if indeed the gastroenterologist     agrees; so we will contact the gastroenterologist's office to find     out when her last colonoscopy was since we do not have access to     that at this time.  Claudia Murphy had recent mammography this year which was negative for recurrence in the right breast.  She had fibroglandular changes, but nothing else seen.  She still has chronic pain in the right breast, especially the upper outer quadrant, that is not  new or different.  She occasionally has pain in the left axilla which comes in goes and is infrequent.  It does not keep her from working, and she is working full- time.  She has no nausea, no vomiting.  No GI or GU symptomatology.  She is not short of breath.  She is not a smoker.  PHYSICAL EXAMINATION:  Vital signs:  She remains, of course, overweight for her height, she is 180 pounds and her blood pressure is 142/85.  She is afebrile today.  Respiratory rate 16-18 and unlabored, pulse right around 80 and regular.  General:  She is in no acute distress.  The left axilla is perfectly clear.  The left chest wall is perfectly clear. There is no adenopathy in cervical, supraclavicular, infraclavicular, axillary or inguinal areas.  The right breast is tender in the upper outer quadrant, but without masses, but there is thickened tissue there. Lungs:  Clear to auscultation and percussion.  Heart:  Shows a regular rhythm and rate without murmur or gallop.  Abdomen:  Soft and nontender without organomegaly or masses.  She has no peripheral edema  of the arms or legs.  LABS:  Pending from today.  Will do a CBC diff and CMET and since she is out more than 5 years, we will see her once a year going forward.   ______________________________ Claudia Murphy. Mariel Sleet, MD ESN/MEDQ  D:  08/27/2010  T:  08/27/2010  Job:  [id number]

## 2010-08-27 NOTE — Progress Notes (Signed)
This office note has been dictated.

## 2010-11-11 LAB — COMPREHENSIVE METABOLIC PANEL
ALT: 23
AST: 28
Albumin: 4
Alkaline Phosphatase: 77
Chloride: 99
GFR calc Af Amer: >60
Potassium: 3.4 — ABNORMAL LOW
Total Bilirubin: 0.9

## 2010-11-11 LAB — CANCER ANTIGEN 27.29: CA 27.29: 20

## 2010-11-11 LAB — DIFFERENTIAL
Basophils Relative: 1
Eosinophils Absolute: 0.2
Monocytes Absolute: 0.5
Monocytes Relative: 7

## 2010-11-11 LAB — CBC
HCT: 38.2
Platelets: 276
WBC: 7.2

## 2010-11-13 LAB — COMPREHENSIVE METABOLIC PANEL
AST: 31
Albumin: 3.9
BUN: 12
CO2: 31
Calcium: 9.3
Creatinine, Ser: 0.61
GFR calc Af Amer: >60
GFR calc non Af Amer: >60

## 2010-11-13 LAB — CBC
MCHC: 35.8
MCV: 88
Platelets: 287

## 2010-11-13 LAB — DIFFERENTIAL
Eosinophils Relative: 3
Lymphocytes Relative: 25
Lymphs Abs: 1.6
Neutro Abs: 4

## 2010-11-21 LAB — DIFFERENTIAL
Basophils Absolute: 0 10*3/uL (ref 0.0–0.1)
Basophils Relative: 1 % (ref 0–1)
Lymphocytes Relative: 27 % (ref 12–46)
Neutro Abs: 5.1 10*3/uL (ref 1.7–7.7)
Neutrophils Relative %: 62 % (ref 43–77)

## 2010-11-21 LAB — CBC
HCT: 38.1 % (ref 36.0–46.0)
Hemoglobin: 12.7 g/dL (ref 12.0–15.0)
MCV: 91 fL (ref 78.0–100.0)
Platelets: 275 10*3/uL (ref 150–400)
WBC: 8.3 10*3/uL (ref 4.0–10.5)

## 2010-11-21 LAB — COMPREHENSIVE METABOLIC PANEL
Albumin: 3.7 g/dL (ref 3.5–5.2)
Alkaline Phosphatase: 70 U/L (ref 39–117)
BUN: 16 mg/dL (ref 6–23)
CO2: 29 mEq/L (ref 19–32)
Chloride: 105 mEq/L (ref 96–112)
Creatinine, Ser: 0.78 mg/dL (ref 0.4–1.2)
GFR calc non Af Amer: >60 mL/min (ref >60)
Glucose, Bld: 104 mg/dL — ABNORMAL HIGH (ref 70–99)
Potassium: 4.3 mEq/L (ref 3.5–5.1)
Total Bilirubin: 0.4 mg/dL (ref 0.3–1.2)

## 2010-11-24 LAB — POTASSIUM: Potassium: 3.7

## 2010-11-25 HISTORY — PX: PLANTAR FASCIA SURGERY: SHX746

## 2010-11-27 LAB — CBC
HCT: 38.8
Hemoglobin: 13.3
MCHC: 34.1
RBC: 4.3
RDW: 13.4

## 2010-11-27 LAB — DIFFERENTIAL
Basophils Relative: 1
Eosinophils Absolute: 0.1
Monocytes Relative: 6
Neutro Abs: 5.1
Neutrophils Relative %: 65

## 2010-11-27 LAB — FACTOR 5 LEIDEN

## 2010-11-27 LAB — COMPREHENSIVE METABOLIC PANEL
ALT: 19
Alkaline Phosphatase: 83
BUN: 14
CO2: 28
Calcium: 8.9
GFR calc non Af Amer: >60
Glucose, Bld: 96
Potassium: 3.2 — ABNORMAL LOW
Sodium: 138
Total Protein: 7.2

## 2010-11-27 LAB — CARDIOLIPIN ANTIBODIES, IGG, IGM, IGA
Anticardiolipin IgA: <7 — ABNORMAL LOW (ref <13)
Anticardiolipin IgM: <7 — ABNORMAL LOW (ref <10)

## 2010-11-27 LAB — PROTEIN C, TOTAL: Protein C, Total: 104 % (ref 70–140)

## 2010-11-27 LAB — PROTEIN S, TOTAL: Protein S Ag, Total: 167 % — ABNORMAL HIGH (ref 70–140)

## 2010-11-27 LAB — LUPUS ANTICOAGULANT PANEL: Lupus Anticoagulant: NOT DETECTED

## 2010-11-27 LAB — HOMOCYSTEINE: Homocysteine: 7.8

## 2010-11-27 LAB — SEDIMENTATION RATE: Sed Rate: 24 — ABNORMAL HIGH

## 2010-11-27 LAB — PROTEIN S ACTIVITY: Protein S Activity: 128 % (ref 69–129)

## 2011-02-17 HISTORY — PX: INJECTION KNEE: SHX2446

## 2011-04-15 ENCOUNTER — Other Ambulatory Visit: Payer: Self-pay | Admitting: Obstetrics and Gynecology

## 2011-04-15 DIAGNOSIS — Z139 Encounter for screening, unspecified: Secondary | ICD-10-CM

## 2011-04-24 ENCOUNTER — Ambulatory Visit (HOSPITAL_COMMUNITY)
Admission: RE | Admit: 2011-04-24 | Discharge: 2011-04-24 | Disposition: A | Discharge status: Home / Self Care | Payer: Managed Care, Other (non HMO) | Source: Ambulatory Visit | Attending: Obstetrics and Gynecology | Admitting: Obstetrics and Gynecology

## 2011-04-24 DIAGNOSIS — Z1231 Encounter for screening mammogram for malignant neoplasm of breast: Secondary | ICD-10-CM | POA: Insufficient documentation

## 2011-04-24 DIAGNOSIS — Z139 Encounter for screening, unspecified: Secondary | ICD-10-CM

## 2011-04-24 DIAGNOSIS — Z853 Personal history of malignant neoplasm of breast: Secondary | ICD-10-CM | POA: Insufficient documentation

## 2011-08-13 ENCOUNTER — Other Ambulatory Visit: Payer: Self-pay | Admitting: Adult Health

## 2011-08-13 ENCOUNTER — Other Ambulatory Visit (HOSPITAL_COMMUNITY)
Admission: RE | Admit: 2011-08-13 | Discharge: 2011-08-13 | Disposition: A | Discharge status: Home / Self Care | Payer: Managed Care, Other (non HMO) | Source: Ambulatory Visit | Attending: Obstetrics and Gynecology | Admitting: Obstetrics and Gynecology

## 2011-08-13 DIAGNOSIS — Z01419 Encounter for gynecological examination (general) (routine) without abnormal findings: Secondary | ICD-10-CM | POA: Insufficient documentation

## 2011-08-13 DIAGNOSIS — Z1159 Encounter for screening for other viral diseases: Secondary | ICD-10-CM | POA: Insufficient documentation

## 2011-08-13 DIAGNOSIS — M810 Age-related osteoporosis without current pathological fracture: Secondary | ICD-10-CM

## 2011-08-13 DIAGNOSIS — M8580 Other specified disorders of bone density and structure, unspecified site: Secondary | ICD-10-CM

## 2011-08-25 ENCOUNTER — Other Ambulatory Visit (HOSPITAL_COMMUNITY): Payer: Managed Care, Other (non HMO)

## 2011-08-26 ENCOUNTER — Other Ambulatory Visit (HOSPITAL_COMMUNITY): Payer: Managed Care, Other (non HMO)

## 2011-08-26 ENCOUNTER — Encounter (HOSPITAL_COMMUNITY): Payer: Managed Care, Other (non HMO) | Attending: Oncology | Admitting: Oncology

## 2011-08-26 ENCOUNTER — Encounter (HOSPITAL_COMMUNITY): Payer: Self-pay | Admitting: Oncology

## 2011-08-26 VITALS — BP 134/85 | HR 80 | Temp 98.1°F | Ht 63.0 in | Wt 170.0 lb

## 2011-08-26 DIAGNOSIS — M1710 Unilateral primary osteoarthritis, unspecified knee: Secondary | ICD-10-CM | POA: Insufficient documentation

## 2011-08-26 DIAGNOSIS — Z853 Personal history of malignant neoplasm of breast: Secondary | ICD-10-CM | POA: Insufficient documentation

## 2011-08-26 DIAGNOSIS — Z09 Encounter for follow-up examination after completed treatment for conditions other than malignant neoplasm: Secondary | ICD-10-CM | POA: Insufficient documentation

## 2011-08-26 DIAGNOSIS — I82A19 Acute embolism and thrombosis of unspecified axillary vein: Secondary | ICD-10-CM

## 2011-08-26 DIAGNOSIS — C50919 Malignant neoplasm of unspecified site of unspecified female breast: Secondary | ICD-10-CM

## 2011-08-26 DIAGNOSIS — M51379 Other intervertebral disc degeneration, lumbosacral region without mention of lumbar back pain or lower extremity pain: Secondary | ICD-10-CM | POA: Insufficient documentation

## 2011-08-26 DIAGNOSIS — C50519 Malignant neoplasm of lower-outer quadrant of unspecified female breast: Secondary | ICD-10-CM

## 2011-08-26 DIAGNOSIS — M5137 Other intervertebral disc degeneration, lumbosacral region: Secondary | ICD-10-CM | POA: Insufficient documentation

## 2011-08-26 LAB — COMPREHENSIVE METABOLIC PANEL
ALT: 27 U/L (ref 0–35)
BUN: 19 mg/dL (ref 6–23)
CO2: 30 mEq/L (ref 19–32)
Calcium: 9.9 mg/dL (ref 8.4–10.5)
GFR calc Af Amer: >90 mL/min (ref >90)
GFR calc non Af Amer: >90 mL/min (ref >90)
Glucose, Bld: 96 mg/dL (ref 70–99)
Sodium: 139 mEq/L (ref 135–145)

## 2011-08-26 LAB — CBC
HCT: 38.6 % (ref 36.0–46.0)
Hemoglobin: 12.6 g/dL (ref 12.0–15.0)
MCH: 29.2 pg (ref 26.0–34.0)
MCV: 89.6 fL (ref 78.0–100.0)
Platelets: 297 10*3/uL (ref 150–400)
RBC: 4.31 MIL/uL (ref 3.87–5.11)
WBC: 7.9 10*3/uL (ref 4.0–10.5)

## 2011-08-26 LAB — DIFFERENTIAL
Eosinophils Relative: 4 % (ref 0–5)
Lymphocytes Relative: 34 % (ref 12–46)
Lymphs Abs: 2.7 10*3/uL (ref 0.7–4.0)
Monocytes Absolute: 0.4 10*3/uL (ref 0.1–1.0)
Monocytes Relative: 5 % (ref 3–12)

## 2011-08-26 NOTE — Patient Instructions (Addendum)
Claudia Murphy  [id number] [birth year] Dr. Glenford Peers   [facility]  Discharge Instructions  RECOMMENDATIONS MADE BY THE CONSULTANT AND ANY TEST RESULTS WILL BE SENT TO YOUR REFERRING DOCTOR.   EXAM FINDINGS BY MD TODAY AND SIGNS AND SYMPTOMS TO REPORT TO CLINIC OR PRIMARY MD: You are doing well.  Report any new lumps, bone pain or shortness of breath.  MEDICATIONS PRESCRIBED: none      SPECIAL INSTRUCTIONS/FOLLOW-UP: Lab work Needed today and Return to Clinic in 1 year.   I acknowledge that I have been informed and understand all the instructions given to me and received a copy. I do not have any more questions at this time, but understand that I may call the Specialty Clinic at [facility] at [phone] during business hours should I have any further questions or need assistance in obtaining follow-up care.    __________________________________________  _____________  __________ Signature of Patient or Authorized Representative            Date                   Time    __________________________________________ Nurse's Signature

## 2011-08-26 NOTE — Progress Notes (Signed)
Problem #1 stage I (T1 C. N0 M0) grade 2 infiltrating ductal carcinoma the left breast triple -14 nodes -6 a marker at 12% status post FEC x6 cycles in a dose dense fashion but with methotrexate substitute for epirubicin in the sixth cycle due to toxicity of the skin. She had a mastectomy and 03/04/2005 and finished all chemotherapy as of 06/03/2005 thus far without recurrent disease.  Problem #2 degenerative disc and joint disease of the lumbar spine and knees. She may need a left knee replacement she states.  Problem #3 DVT of the right axillary vein do a PICC line with chronic changes of the right axillary vein with rare swelling of the right arm.  She also findings colonoscopy at some point but is reluctant to have this procedure done. She is working full-time. What bothers her now is her left knee. Her oncologic review of systems otherwise is unremarkable. Her vital signs are stable and in the chart. She has no lymphadenopathy. The left chest wall is clear. The right breast is tender but negative for any masses. She has no adenopathy. Lungs are clear. Heart shows a regular rhythm and rate without murmur rub or gallop. Her abdomen is mildly obese without masses without organomegaly. She has no peripheral edema of the arms or legs presently other than puffiness of the right lower arm and hand which is slight.  She has lost some weight which is great we'll see her back in 12 months

## 2011-08-28 ENCOUNTER — Other Ambulatory Visit (HOSPITAL_COMMUNITY): Payer: Managed Care, Other (non HMO)

## 2011-09-04 ENCOUNTER — Other Ambulatory Visit (HOSPITAL_COMMUNITY): Payer: Managed Care, Other (non HMO)

## 2011-09-10 ENCOUNTER — Other Ambulatory Visit (HOSPITAL_COMMUNITY): Payer: Managed Care, Other (non HMO)

## 2011-09-15 ENCOUNTER — Ambulatory Visit (HOSPITAL_COMMUNITY)
Admission: RE | Admit: 2011-09-15 | Discharge: 2011-09-15 | Disposition: A | Discharge status: Home / Self Care | Payer: Managed Care, Other (non HMO) | Source: Ambulatory Visit | Attending: Adult Health | Admitting: Adult Health

## 2011-09-15 DIAGNOSIS — M810 Age-related osteoporosis without current pathological fracture: Secondary | ICD-10-CM

## 2011-09-15 DIAGNOSIS — M949 Disorder of cartilage, unspecified: Secondary | ICD-10-CM | POA: Insufficient documentation

## 2011-09-15 DIAGNOSIS — M8580 Other specified disorders of bone density and structure, unspecified site: Secondary | ICD-10-CM

## 2011-09-15 DIAGNOSIS — M899 Disorder of bone, unspecified: Secondary | ICD-10-CM | POA: Insufficient documentation

## 2011-09-15 DIAGNOSIS — M818 Other osteoporosis without current pathological fracture: Secondary | ICD-10-CM | POA: Insufficient documentation

## 2011-12-15 ENCOUNTER — Encounter (HOSPITAL_COMMUNITY)
Admission: RE | Admit: 2011-12-15 | Discharge: 2011-12-15 | Payer: Managed Care, Other (non HMO) | Source: Ambulatory Visit | Attending: Orthopedic Surgery | Admitting: Orthopedic Surgery

## 2011-12-15 NOTE — Pre-Procedure Instructions (Signed)
20 NINNIE FEIN  12/15/2011   Your procedure is scheduled on:  01/04/12  Report to [facility] at 530 AM.  Call this number if you have problems the morning of surgery: [phone]   Remember:   Do not eat food:After Midnight.    Take these medicines the morning of surgery with A SIP OF WATER: xanax,celebrex,gabapentin,hydrocodone   Do not wear jewelry, make-up or nail polish.  Do not wear lotions, powders, or perfumes. You may wear deodorant.  Do not shave 48 hours prior to surgery. Men may shave face and neck.  Do not bring valuables to the hospital.  Contacts, dentures or bridgework may not be worn into surgery.  Leave suitcase in the car. After surgery it may be brought to your room.  For patients admitted to the hospital, checkout time is 11:00 AM the day of discharge.   Patients discharged the day of surgery will not be allowed to drive home.  Name and phone number of your driver: family  Special Instructions: Shower using CHG 2 nights before surgery and the night before surgery.  If you shower the day of surgery use CHG.  Use special wash - you have one bottle of CHG for all showers.  You should use approximately 1/3 of the bottle for each shower.   Please read over the following fact sheets that you were given: Pain Booklet, Coughing and Deep Breathing, MRSA Information and Surgical Site Infection Prevention

## 2011-12-23 ENCOUNTER — Encounter (HOSPITAL_COMMUNITY): Payer: Self-pay | Admitting: Pharmacy Technician

## 2011-12-23 ENCOUNTER — Other Ambulatory Visit: Payer: Self-pay | Admitting: Physician Assistant

## 2011-12-23 ENCOUNTER — Encounter: Payer: Self-pay | Admitting: Physician Assistant

## 2011-12-23 DIAGNOSIS — H2589 Other age-related cataract: Secondary | ICD-10-CM

## 2011-12-23 DIAGNOSIS — G5603 Carpal tunnel syndrome, bilateral upper limbs: Secondary | ICD-10-CM

## 2011-12-23 DIAGNOSIS — C50919 Malignant neoplasm of unspecified site of unspecified female breast: Secondary | ICD-10-CM

## 2011-12-23 NOTE — H&P (Signed)
TOTAL KNEE ADMISSION H&P  Patient is being admitted for left total knee arthroplasty.  Subjective:  Chief Complaint:left knee pain.  HPI: Claudia Murphy, 56 y.o. female, has a history of pain and functional disability in the left knee due to arthritis and has failed non-surgical conservative treatments for greater than 12 weeks to includeNSAID's and/or analgesics, corticosteriod injections, viscosupplementation injections, flexibility and strengthening excercises, supervised PT with diminished ADL's post treatment, weight reduction as appropriate and activity modification.  Onset of symptoms was gradual, starting 6 years ago with gradually worsening course since that time. The patient noted prior procedures on the knee to include  arthroscopy on the left knee(s).  Patient currently rates pain in the left knee(s) at 10 out of 10 with activity. Patient has night pain, worsening of pain with activity and weight bearing, pain that interferes with activities of daily living, pain with passive range of motion, crepitus and joint swelling.  Patient has evidence of subchondral sclerosis, periarticular osteophytes and joint space narrowing by imaging studies.  There is no active infection.  Patient Active Problem List   Diagnosis Date Noted  . Breast cancer   . Carpal tunnel syndrome on both sides   . Cataract mature, total senile   . Infiltrating ductal carcinoma of left breast, stage 1 08/13/2010   Past Medical History  Diagnosis Date  . Breast cancer   . Osteoporosis   . Carpal tunnel syndrome on both sides   . 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 Cataract mature, total senile     Past Surgical History  Procedure Date  . Tubal ligation   . Knee arthroscopy   . Mastectomy 01/07    left  . Foot surgery Nov 25 2010    plantar fascitis  . Injection knee 2013    lt     (Not in a hospital admission) Allergies  Allergen Reactions  . Methocarbamol Shortness Of  Breath  . Epirubicin     Skin toxicity  . Penicillins Rash    Current Outpatient Prescriptions on File Prior to Visit  Medication Sig Dispense Refill  . ALPRAZolam (XANAX) 0.5 MG tablet Take 0.5 mg by mouth 3 (three) times daily as needed. For anxiety      . aspirin 81 MG tablet Take 81 mg by mouth daily.        . calcium-vitamin D (OSCAL WITH D) 500-200 MG-UNIT per tablet Take 1 tablet by mouth 2 (two) times daily.       Marland Kitchen dexlansoprazole (DEXILANT) 60 MG capsule Take 60 mg by mouth daily as needed. For acid reflux      . gabapentin (NEURONTIN) 300 MG capsule Take 300 mg by mouth 3 (three) times daily.      Marland Kitchen HYDROcodone-acetaminophen (NORCO) 7.5-325 MG per tablet Take 1 tablet by mouth every 6 (six) hours as needed. For pain      . ibandronate (BONIVA) 150 MG tablet Take 150 mg by mouth every 30 (thirty) days. On the 29th of each month; Take in the morning with a full glass of water, on an empty stomach, and do not take anything else by mouth or lie down for the next 30 min.      . meloxicam (MOBIC) 15 MG tablet Take 15 mg by mouth daily.          History  Substance Use Topics  . Smoking status: Former Smoker    Quit date: 11/16/2004  . Smokeless tobacco: Not on  file  . Alcohol Use: No    Family History  Problem Relation Age of Onset  . Diabetes Mother   . Heart disease Father   . Diabetes Son   . Hypertension       Review of Systems  Constitutional: Negative.   HENT: Negative.   Eyes: Negative.   Respiratory: Negative.   Cardiovascular: Negative.   Gastrointestinal: Negative.   Genitourinary: Negative.   Musculoskeletal: Positive for back pain and joint pain.       Left knee  Skin: Negative.   Neurological: Negative.   Endo/Heme/Allergies: Negative.   Psychiatric/Behavioral: Negative.     Objective:  Physical Exam  Constitutional: She is oriented to person, place, and time. She appears well-developed and well-nourished.  HENT:  Head: Normocephalic and  atraumatic.  Mouth/Throat: Oropharynx is clear and moist.  Eyes: Conjunctivae normal and EOM are normal. Pupils are equal, round, and reactive to light.  Neck: Normal range of motion. Neck supple.  Cardiovascular: Normal rate.   Genitourinary:       Not pertinent to current symptomatology therefore not examined.  Musculoskeletal:        Examination of her left knee reveals significant pain medially.  1+ synovitis.  1+ crepitation.  Range of motion from -5 to 125 degrees.  Knee is stable with normal patella tracking.  Examination of the right knee reveals full range of motion without pain, swelling, weakness or instability.  Vascular exam: Pulses are 2+ and symmetric.    Neurological: She is alert and oriented to person, place, and time.  Skin: Skin is warm and dry.  Psychiatric: She has a normal mood and affect. Her behavior is normal. Judgment and thought content normal.    Vital signs in last 24 hours:  Last recorded: 11/06 1500   BP: 152/90 Pulse: 110  Temp: 98.3 F (36.8 C)    Height: 5\' 2"  (1.575 m) SpO2: 100  Weight: 77.111 kg (170 lb)    Labs:   Estimated Body mass index is 31.09 kg/(m^2) as calculated from the following:   Height as of this encounter: 5\' 2" (1.575 m).   Weight as of this encounter: 170 lb(77.111 kg).   Imaging Review Plain radiographs demonstrate severe degenerative joint disease of the left knee(s). The overall alignment ismild varus. The bone quality appears to be good for age and reported activity level.  Assessment/Plan:  End stage arthritis, left knee   The patient history, physical examination, clinical judgment of the provider and imaging studies are consistent with end stage degenerative joint disease of the left knee(s) and total knee arthroplasty is deemed medically necessary. The treatment options including medical management, injection therapy arthroscopy and arthroplasty were discussed at length. The risks and benefits of total knee  arthroplasty were presented and reviewed. The risks due to aseptic loosening, infection, stiffness, patella tracking problems, thromboembolic complications and other imponderables were discussed. The patient acknowledged the explanation, agreed to proceed with the plan and consent was signed. Patient is being admitted for inpatient treatment for surgery, pain control, PT, OT, prophylactic antibiotics, VTE prophylaxis, progressive ambulation and ADL's and discharge planning. The patient is planning to be discharged home with home health services

## 2011-12-28 ENCOUNTER — Encounter (HOSPITAL_COMMUNITY)
Admission: RE | Admit: 2011-12-28 | Discharge: 2011-12-28 | Disposition: A | Discharge status: Home / Self Care | Payer: Managed Care, Other (non HMO) | Source: Ambulatory Visit | Attending: Physician Assistant | Admitting: Physician Assistant

## 2011-12-28 ENCOUNTER — Encounter (HOSPITAL_COMMUNITY)
Admission: RE | Admit: 2011-12-28 | Discharge: 2011-12-28 | Disposition: A | Discharge status: Home / Self Care | Payer: Managed Care, Other (non HMO) | Source: Ambulatory Visit | Attending: Orthopedic Surgery | Admitting: Orthopedic Surgery

## 2011-12-28 ENCOUNTER — Encounter (HOSPITAL_COMMUNITY): Payer: Self-pay

## 2011-12-28 ENCOUNTER — Inpatient Hospital Stay (HOSPITAL_COMMUNITY): Admission: RE | Admit: 2011-12-28 | Payer: Managed Care, Other (non HMO) | Source: Ambulatory Visit

## 2011-12-28 DIAGNOSIS — Z01818 Encounter for other preprocedural examination: Secondary | ICD-10-CM | POA: Insufficient documentation

## 2011-12-28 DIAGNOSIS — Z01812 Encounter for preprocedural laboratory examination: Secondary | ICD-10-CM | POA: Insufficient documentation

## 2011-12-28 DIAGNOSIS — Z01810 Encounter for preprocedural cardiovascular examination: Secondary | ICD-10-CM | POA: Insufficient documentation

## 2011-12-28 HISTORY — DX: Gastro-esophageal reflux disease without esophagitis: K21.9

## 2011-12-28 HISTORY — DX: Other specified postprocedural states: Z98.890

## 2011-12-28 HISTORY — DX: Nausea with vomiting, unspecified: R11.2

## 2011-12-28 HISTORY — DX: Anxiety disorder, unspecified: F41.9

## 2011-12-28 LAB — DIFFERENTIAL
Basophils Absolute: 0 10*3/uL (ref 0.0–0.1)
Basophils Relative: 0 % (ref 0–1)
Lymphocytes Relative: 29 % (ref 12–46)
Monocytes Absolute: 0.9 10*3/uL (ref 0.1–1.0)
Neutro Abs: 8.5 10*3/uL — ABNORMAL HIGH (ref 1.7–7.7)
Neutrophils Relative %: 64 % (ref 43–77)

## 2011-12-28 NOTE — Pre-Procedure Instructions (Signed)
20 Claudia Murphy  12/28/2011   Your procedure is scheduled on:  Friday November 18  Report to [facility] at 5:30 AM.  Call this number if you have problems the morning of surgery: [phone]   Remember:   Do not eat or drink:After Midnight.    Take these medicines the morning of surgery with A SIP OF WATER: Gabapentin (Neurontin). May take Hydrocodone, Dexilant, Xanax if needed.    Do not wear jewelry, make-up or nail polish.  Do not wear lotions, powders, or perfumes. You may wear deodorant.  Do not shave 48 hours prior to surgery. Men may shave face and neck.  Do not bring valuables to the hospital.  Contacts, dentures or bridgework may not be worn into surgery.  Leave suitcase in the car. After surgery it may be brought to your room.  For patients admitted to the hospital, checkout time is 11:00 AM the day of discharge.   Patients discharged the day of surgery will not be allowed to drive home.  Name and phone number of your driver: NA  Special Instructions: Shower using CHG 2 nights before surgery and the night before surgery.  If you shower the day of surgery use CHG.  Use special wash - you have one bottle of CHG for all showers.  You should use approximately 1/3 of the bottle for each shower.   Please read over the following fact sheets that you were given:  Incentive Spirometry, Pain Booklet, Coughing and Deep Breathing, Blood Transfusion Information, Total Joint Packet and Surgical Site Infection Prevention

## 2012-01-04 ENCOUNTER — Encounter (HOSPITAL_COMMUNITY): Admission: RE | Payer: Self-pay | Source: Ambulatory Visit

## 2012-01-04 ENCOUNTER — Ambulatory Visit (HOSPITAL_COMMUNITY)
Admission: RE | Admit: 2012-01-04 | Payer: Managed Care, Other (non HMO) | Source: Ambulatory Visit | Admitting: Orthopedic Surgery

## 2012-01-04 SURGERY — ARTHROPLASTY, KNEE, UNICOMPARTMENTAL
Anesthesia: General | Laterality: Left

## 2012-01-08 ENCOUNTER — Encounter: Payer: Self-pay | Admitting: *Deleted

## 2012-01-12 MED ORDER — BUPIVACAINE HCL (PF) 0.25 % IJ SOLN
INTRAMUSCULAR | Status: AC
  Filled 2012-01-12: qty 30

## 2012-01-12 MED ORDER — THROMBIN 20000 UNITS EX SOLR
CUTANEOUS | Status: AC
  Filled 2012-01-12: qty 20000

## 2012-01-12 MED ORDER — GENTAMICIN SULFATE 40 MG/ML IJ SOLN
INTRAMUSCULAR | Status: AC
  Filled 2012-01-12: qty 6

## 2012-01-12 MED ORDER — LIDOCAINE-EPINEPHRINE (PF) 1 %-1:200000 IJ SOLN
INTRAMUSCULAR | Status: AC
  Filled 2012-01-12: qty 10

## 2012-01-12 MED ORDER — VANCOMYCIN HCL IN DEXTROSE 1-5 GM/200ML-% IV SOLN
INTRAVENOUS | Status: AC
  Filled 2012-01-12: qty 200

## 2012-01-12 MED ORDER — VANCOMYCIN HCL 1000 MG IV SOLR
INTRAVENOUS | Status: AC
  Filled 2012-01-12: qty 1000

## 2012-01-12 MED ORDER — BACITRACIN ZINC 500 UNIT/GM EX OINT
TOPICAL_OINTMENT | CUTANEOUS | Status: AC
  Filled 2012-01-12: qty 15

## 2012-01-13 ENCOUNTER — Other Ambulatory Visit: Payer: Self-pay | Admitting: Physician Assistant

## 2012-01-13 ENCOUNTER — Encounter: Payer: Self-pay | Admitting: Physician Assistant

## 2012-01-13 DIAGNOSIS — M1712 Unilateral primary osteoarthritis, left knee: Secondary | ICD-10-CM | POA: Insufficient documentation

## 2012-01-13 NOTE — H&P (Signed)
Claudia Murphy is an 56 y.o. female.   Chief Complaint: left knee pain HPI: Claudia Murphy comes in today to discuss left knee pain.  Claudia Murphy is a well-developed, well-nourished 56 year-old female who has failed anti-inflammatories and intraarticular Cortisone injections.  Claudia Murphy currently has medial compartment end stage DJD.  He ACL is intact.  Claudia Murphy is here to discuss a unicompartmental total knee replacement. Past medical history: Significant for breast cancer, osteoporosis, carpal tunnel syndrome, infiltrating ductal carcinoma of the breast and cataract.    Past Medical History  Diagnosis Date  . Breast cancer   . Osteoporosis   . Carpal tunnel syndrome on both sides   . 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 Cataract mature, total senile   . PONV (postoperative nausea and vomiting)   . Anxiety   . GERD (gastroesophageal reflux disease)   . Left knee DJD     Past Surgical History  Procedure Date  . Tubal ligation   . Knee arthroscopy   . Mastectomy 01/07    left  . Foot surgery Nov 25 2010    plantar fascitis  . Injection knee 2013    lt    Family History  Problem Relation Age of Onset  . Diabetes Mother   . Heart disease Father   . Diabetes Son   . Hypertension Father   . Hypertension Mother    Social History:  reports that Claudia Murphy quit smoking about 7 years ago. Claudia Murphy does not have any smokeless tobacco history on file. Claudia Murphy reports that Claudia Murphy does not drink alcohol or use illicit drugs.  Allergies:  Allergies  Allergen Reactions  . Methocarbamol Shortness Of Breath  . Epirubicin     Skin toxicity  . Penicillins Rash   Current Outpatient Prescriptions on File Prior to Visit  Medication Sig Dispense Refill  . ALPRAZolam (XANAX) 0.5 MG tablet Take 0.5 mg by mouth 3 (three) times daily as needed. For anxiety      . aspirin 81 MG tablet Take 81 mg by mouth daily.        . calcium-vitamin D (OSCAL WITH D) 500-200 MG-UNIT per tablet Take 1 tablet by mouth 2 (two)  times daily.       . gabapentin (NEURONTIN) 300 MG capsule Take 300 mg by mouth 3 (three) times daily.      . HYDROcodone-acetaminophen (NORCO) 7.5-325 MG per tablet Take 1 tablet by mouth every 6 (six) hours as needed. For pain      . ibandronate (BONIVA) 150 MG tablet Take 150 mg by mouth every 30 (thirty) days. On the 29th of each month; Take in the morning with a full glass of water, on an empty stomach, and do not take anything else by mouth or lie down for the next 30 min.      . meloxicam (MOBIC) 15 MG tablet Take 15 mg by mouth daily.        . dexlansoprazole (DEXILANT) 60 MG capsule Take 60 mg by mouth daily as needed. For acid reflux         (Not in a hospital admission)  No results found for this or any previous visit (from the past 48 hour(s)). No results found.  Review of Systems  Constitutional: Negative.   HENT: Negative.   Eyes: Negative.   Respiratory: Negative.   Cardiovascular: Negative.   Gastrointestinal: Negative.   Genitourinary: Negative.   Musculoskeletal: Positive for joint pain.       Left   knee djd  Skin: Negative.   Neurological: Negative.   Endo/Heme/Allergies: Negative.     Blood pressure 156/93, pulse 79, temperature 98.1 F (36.7 C), height 5' 2" (1.575 m), weight 78.472 kg (173 lb), SpO2 100.00%. Physical Exam  Constitutional: Claudia Murphy is oriented to person, place, and time. Claudia Murphy appears well-developed and well-nourished.  HENT:  Head: Normocephalic and atraumatic.  Eyes: Conjunctivae normal and EOM are normal. Pupils are equal, round, and reactive to light.  Neck: Normal range of motion.  Cardiovascular: Normal rate and regular rhythm.   Respiratory: Effort normal and breath sounds normal.  GI: Soft. Bowel sounds are normal.  Genitourinary:       Not pertinent to current symptomatology therefore not examined.  Musculoskeletal:       Left knee has medial joint line tenderness.  1+ synovitis.  1+ crepitation.  Range of motion -5 to 120 degrees with  a varus deformity that corrects easy with stressing.  Knee is stable with normal patella tracking.  Right knee has full range of motion without pain, swelling, weakness or deformity.  Claudia Murphy has 2+ dorsalis pedis pulses.  Skin warm and dry without rashes or abrasions.  Claudia Murphy has normal mood and affect.   Neurological: Claudia Murphy is alert and oriented to person, place, and time.  Skin: Skin is warm and dry.  Psychiatric: Claudia Murphy has a normal mood and affect. Claudia Murphy behavior is normal. Judgment and thought content normal.    X-RAYS: X-rays show overall mild varus deformity that corrects with stressing.  Bone on bone osteoarthritis with periarticular spurring and subchondral sclerosis.    Assessment Patient Active Problem List  Diagnosis  . Infiltrating ductal carcinoma of left breast, stage 1  . Breast cancer  . Carpal tunnel syndrome on both sides  . Cataract mature, total senile  . Left knee DJD   Plan At this point in time risks, benefits and possible complications have been discussed in detail with the patient.  Claudia Murphy is without question.  Claudia Murphy is planning on staying overnight in the hospital for one day and being discharged to home, but Claudia Murphy will be admitted as an inpatient, as this is an inpatient only procedure.    Claudia Murphy J 01/13/2012, 5:13 PM    

## 2012-01-15 ENCOUNTER — Encounter (HOSPITAL_COMMUNITY): Payer: Self-pay | Admitting: Respiratory Therapy

## 2012-01-19 MED ORDER — CHLORHEXIDINE GLUCONATE 4 % EX LIQD: 60.0000 mL | Freq: Once | CUTANEOUS | Status: DC

## 2012-01-19 MED ORDER — POVIDONE-IODINE 7.5 % EX SOLN: Freq: Once | CUTANEOUS | Status: DC

## 2012-01-19 MED ORDER — VANCOMYCIN HCL IN DEXTROSE 1-5 GM/200ML-% IV SOLN: 1000.0000 mg | INTRAVENOUS | Status: DC

## 2012-01-19 MED ORDER — LACTATED RINGERS IV SOLN: INTRAVENOUS | Status: DC

## 2012-01-19 NOTE — Progress Notes (Signed)
1510....MAY ADD  ' KNEE REPLACEMENT'   TO PERMIT.Marland KitchenMarland KitchenPER  Sherrie  At office....da

## 2012-01-20 ENCOUNTER — Encounter (HOSPITAL_COMMUNITY): Payer: Self-pay

## 2012-01-20 ENCOUNTER — Encounter (HOSPITAL_COMMUNITY)
Admission: RE | Admit: 2012-01-20 | Discharge: 2012-01-20 | Disposition: A | Discharge status: Home / Self Care | Payer: Managed Care, Other (non HMO) | Source: Ambulatory Visit | Attending: Orthopedic Surgery | Admitting: Orthopedic Surgery

## 2012-01-20 LAB — CBC WITH DIFFERENTIAL/PLATELET
Basophils Absolute: 0.1 10*3/uL (ref 0.0–0.1)
Basophils Relative: 1 % (ref 0–1)
Eosinophils Absolute: 0.1 10*3/uL (ref 0.0–0.7)
Eosinophils Relative: 1 % (ref 0–5)
HCT: 41.4 % (ref 36.0–46.0)
MCH: 29.6 pg (ref 26.0–34.0)
MCHC: 32.9 g/dL (ref 30.0–36.0)
MCV: 90 fL (ref 78.0–100.0)
Monocytes Absolute: 0.7 10*3/uL (ref 0.1–1.0)
Neutro Abs: 8.8 10*3/uL — ABNORMAL HIGH (ref 1.7–7.7)
RDW: 12.8 % (ref 11.5–15.5)

## 2012-01-20 LAB — COMPREHENSIVE METABOLIC PANEL
AST: 22 U/L (ref 0–37)
Albumin: 4.3 g/dL (ref 3.5–5.2)
BUN: 8 mg/dL (ref 6–23)
Calcium: 10.1 mg/dL (ref 8.4–10.5)
Creatinine, Ser: 0.56 mg/dL (ref 0.50–1.10)
Total Protein: 8.3 g/dL (ref 6.0–8.3)

## 2012-01-20 LAB — URINALYSIS, ROUTINE W REFLEX MICROSCOPIC
Bilirubin Urine: NEGATIVE
Glucose, UA: NEGATIVE mg/dL
Ketones, ur: NEGATIVE mg/dL
Leukocytes, UA: NEGATIVE
Nitrite: NEGATIVE
Protein, ur: NEGATIVE mg/dL

## 2012-01-20 LAB — APTT: aPTT: 28 seconds (ref 24–37)

## 2012-01-20 LAB — PROTIME-INR
INR: 0.94 (ref 0.00–1.49)
Prothrombin Time: 12.5 seconds (ref 11.6–15.2)

## 2012-01-20 LAB — TYPE AND SCREEN: ABO/RH(D): O POS

## 2012-01-20 NOTE — Pre-Procedure Instructions (Signed)
20 GIAVONNA PFLUM  01/20/2012   Your procedure is scheduled on:  Monday, December 9th  Report to [facility] at 0715 AM.  Call this number if you have problems the morning of surgery: [phone]   Remember:   Do not eat food or drink: After Midnight.   Take these medicines the morning of surgery with A SIP OF WATER: xanax if needed, neurontin, vicodin if needed  Do not wear jewelry, make-up or nail polish.  Do not wear lotions, powders, or perfumes.  Do not shave 48 hours prior to surgery.   Do not bring valuables to the hospital.  Contacts, dentures or bridgework may not be worn into surgery.  Leave suitcase in the car. After surgery it may be brought to your room.  For patients admitted to the hospital, checkout time is 11:00 AM the day of discharge.   Patients discharged the day of surgery will not be allowed to drive home.    Special Instructions: Shower using CHG 2 nights before surgery and the night before surgery.  If you shower the day of surgery use CHG.  Use special wash - you have one bottle of CHG for all showers.  You should use approximately 1/3 of the bottle for each shower.   Please read over the following fact sheets that you were given: Pain Booklet, Coughing and Deep Breathing, Blood Transfusion Information, MRSA Information and Surgical Site Infection Prevention

## 2012-01-20 NOTE — Progress Notes (Signed)
Primary Physician - Dr. Renette Butters [facility] in [place] Does not have a cardiologist  No previous cardiac workup

## 2012-01-24 MED ORDER — VANCOMYCIN HCL IN DEXTROSE 1-5 GM/200ML-% IV SOLN
1000.0000 mg | INTRAVENOUS | Status: AC
  Administered 2012-01-25: 1000 mg via INTRAVENOUS
  Filled 2012-01-24: qty 200

## 2012-01-25 ENCOUNTER — Encounter (HOSPITAL_COMMUNITY): Payer: Self-pay | Admitting: Anesthesiology

## 2012-01-25 ENCOUNTER — Encounter (HOSPITAL_COMMUNITY)
Admission: RE | Disposition: A | Discharge status: Home Health | Payer: Self-pay | Source: Ambulatory Visit | Attending: Orthopedic Surgery

## 2012-01-25 ENCOUNTER — Inpatient Hospital Stay (HOSPITAL_COMMUNITY)
Admission: RE | Admit: 2012-01-25 | Discharge: 2012-01-26 | DRG: 470 | Disposition: A | Discharge status: Home Health | Payer: Managed Care, Other (non HMO) | Source: Ambulatory Visit | Attending: Orthopedic Surgery | Admitting: Orthopedic Surgery

## 2012-01-25 ENCOUNTER — Encounter (HOSPITAL_COMMUNITY): Payer: Self-pay | Admitting: General Practice

## 2012-01-25 ENCOUNTER — Ambulatory Visit (HOSPITAL_COMMUNITY): Payer: Managed Care, Other (non HMO) | Admitting: Anesthesiology

## 2012-01-25 DIAGNOSIS — Z880 Allergy status to penicillin: Secondary | ICD-10-CM

## 2012-01-25 DIAGNOSIS — M810 Age-related osteoporosis without current pathological fracture: Secondary | ICD-10-CM | POA: Diagnosis present

## 2012-01-25 DIAGNOSIS — Z7982 Long term (current) use of aspirin: Secondary | ICD-10-CM

## 2012-01-25 DIAGNOSIS — Z9010 Acquired absence of unspecified breast and nipple: Secondary | ICD-10-CM

## 2012-01-25 DIAGNOSIS — IMO0002 Reserved for concepts with insufficient information to code with codable children: Secondary | ICD-10-CM | POA: Diagnosis present

## 2012-01-25 DIAGNOSIS — E669 Obesity, unspecified: Secondary | ICD-10-CM | POA: Diagnosis present

## 2012-01-25 DIAGNOSIS — Z79899 Other long term (current) drug therapy: Secondary | ICD-10-CM

## 2012-01-25 DIAGNOSIS — G5600 Carpal tunnel syndrome, unspecified upper limb: Secondary | ICD-10-CM | POA: Diagnosis present

## 2012-01-25 DIAGNOSIS — Z888 Allergy status to other drugs, medicaments and biological substances status: Secondary | ICD-10-CM

## 2012-01-25 DIAGNOSIS — Z853 Personal history of malignant neoplasm of breast: Secondary | ICD-10-CM

## 2012-01-25 DIAGNOSIS — F411 Generalized anxiety disorder: Secondary | ICD-10-CM | POA: Diagnosis present

## 2012-01-25 DIAGNOSIS — Z6830 Body mass index (BMI) 30.0-30.9, adult: Secondary | ICD-10-CM

## 2012-01-25 DIAGNOSIS — M1710 Unilateral primary osteoarthritis, unspecified knee: Principal | ICD-10-CM | POA: Diagnosis present

## 2012-01-25 DIAGNOSIS — Z87891 Personal history of nicotine dependence: Secondary | ICD-10-CM

## 2012-01-25 DIAGNOSIS — K219 Gastro-esophageal reflux disease without esophagitis: Secondary | ICD-10-CM | POA: Diagnosis present

## 2012-01-25 DIAGNOSIS — M1712 Unilateral primary osteoarthritis, left knee: Secondary | ICD-10-CM | POA: Diagnosis present

## 2012-01-25 HISTORY — DX: Unspecified osteoarthritis, unspecified site: M19.90

## 2012-01-25 HISTORY — PX: PARTIAL KNEE ARTHROPLASTY: SHX2174

## 2012-01-25 HISTORY — DX: Low back pain, unspecified: M54.50

## 2012-01-25 HISTORY — DX: Polyneuropathy, unspecified: G62.9

## 2012-01-25 HISTORY — DX: Other chronic pain: G89.29

## 2012-01-25 HISTORY — DX: Other specified disorders of bone density and structure, unspecified site: M85.80

## 2012-01-25 HISTORY — DX: Pneumonia, unspecified organism: J18.9

## 2012-01-25 HISTORY — DX: Low back pain: M54.5

## 2012-01-25 HISTORY — DX: Personal history of other diseases of the digestive system: Z87.19

## 2012-01-25 HISTORY — DX: Personal history of peptic ulcer disease: Z87.11

## 2012-01-25 HISTORY — PX: REPLACEMENT UNICONDYLAR JOINT KNEE: SUR1227

## 2012-01-25 SURGERY — ARTHROPLASTY, KNEE, UNICOMPARTMENTAL
Anesthesia: General | Site: Knee | Laterality: Left | Wound class: Clean

## 2012-01-25 MED ORDER — BUPIVACAINE-EPINEPHRINE PF 0.25-1:200000 % IJ SOLN
INTRAMUSCULAR | Status: DC | PRN
  Administered 2012-01-25: 30 mL

## 2012-01-25 MED ORDER — 0.9 % SODIUM CHLORIDE (POUR BTL) OPTIME
TOPICAL | Status: DC | PRN
  Administered 2012-01-25: 1000 mL

## 2012-01-25 MED ORDER — HYDROMORPHONE HCL PF 1 MG/ML IJ SOLN
INTRAMUSCULAR | Status: AC
  Filled 2012-01-25: qty 1

## 2012-01-25 MED ORDER — MIDAZOLAM HCL 5 MG/5ML IJ SOLN
INTRAMUSCULAR | Status: DC | PRN
  Administered 2012-01-25: 2 mg via INTRAVENOUS

## 2012-01-25 MED ORDER — DIAZEPAM 5 MG/ML IJ SOLN
5.0000 mg | Freq: Once | INTRAMUSCULAR | Status: AC
  Administered 2012-01-25: 5 mg via INTRAVENOUS

## 2012-01-25 MED ORDER — SCOPOLAMINE 1 MG/3DAYS TD PT72: 1.0000 | MEDICATED_PATCH | Freq: Once | TRANSDERMAL | Status: DC

## 2012-01-25 MED ORDER — LACTATED RINGERS IV SOLN: INTRAVENOUS | Status: DC

## 2012-01-25 MED ORDER — FENTANYL CITRATE 0.05 MG/ML IJ SOLN: 50.0000 ug | Freq: Once | INTRAMUSCULAR | Status: DC

## 2012-01-25 MED ORDER — FENTANYL CITRATE 0.05 MG/ML IJ SOLN
INTRAMUSCULAR | Status: DC | PRN
  Administered 2012-01-25: 50 ug via INTRAVENOUS
  Administered 2012-01-25: 25 ug via INTRAVENOUS
  Administered 2012-01-25: 100 ug via INTRAVENOUS
  Administered 2012-01-25: 50 ug via INTRAVENOUS
  Administered 2012-01-25: 25 ug via INTRAVENOUS

## 2012-01-25 MED ORDER — ESMOLOL HCL 10 MG/ML IV SOLN
INTRAVENOUS | Status: DC | PRN
  Administered 2012-01-25: 10 mg via INTRAVENOUS

## 2012-01-25 MED ORDER — CHLORHEXIDINE GLUCONATE 4 % EX LIQD
60.0000 mL | Freq: Once | CUTANEOUS | Status: DC
Start: 2012-01-25 — End: 2012-01-25

## 2012-01-25 MED ORDER — MIDAZOLAM HCL 2 MG/2ML IJ SOLN: 1.0000 mg | INTRAMUSCULAR | Status: DC | PRN

## 2012-01-25 MED ORDER — ROCURONIUM BROMIDE 100 MG/10ML IV SOLN
INTRAVENOUS | Status: DC | PRN
  Administered 2012-01-25: 10 mg via INTRAVENOUS
  Administered 2012-01-25: 40 mg via INTRAVENOUS

## 2012-01-25 MED ORDER — ENOXAPARIN SODIUM 30 MG/0.3ML ~~LOC~~ SOLN
30.0000 mg | Freq: Two times a day (BID) | SUBCUTANEOUS | Status: DC
  Administered 2012-01-26: 30 mg via SUBCUTANEOUS
  Filled 2012-01-25 (×3): qty 0.3

## 2012-01-25 MED ORDER — DEXAMETHASONE 6 MG PO TABS
10.0000 mg | ORAL_TABLET | Freq: Three times a day (TID) | ORAL | Status: AC
  Administered 2012-01-25 – 2012-01-26 (×3): 10 mg via ORAL
  Filled 2012-01-25 (×3): qty 1

## 2012-01-25 MED ORDER — OXYCODONE HCL 5 MG PO TABS
ORAL_TABLET | ORAL | Status: AC
  Filled 2012-01-25: qty 1

## 2012-01-25 MED ORDER — BUPIVACAINE-EPINEPHRINE PF 0.25-1:200000 % IJ SOLN
INTRAMUSCULAR | Status: AC
  Filled 2012-01-25: qty 30

## 2012-01-25 MED ORDER — PROPOFOL 10 MG/ML IV BOLUS
INTRAVENOUS | Status: DC | PRN
  Administered 2012-01-25: 200 mg via INTRAVENOUS

## 2012-01-25 MED ORDER — VANCOMYCIN HCL 1000 MG IV SOLR
750.0000 mg | Freq: Two times a day (BID) | INTRAVENOUS | Status: DC
  Administered 2012-01-26: 750 mg via INTRAVENOUS
  Filled 2012-01-25 (×3): qty 750

## 2012-01-25 MED ORDER — LACTATED RINGERS IV SOLN
INTRAVENOUS | Status: DC | PRN
  Administered 2012-01-25 (×2): via INTRAVENOUS

## 2012-01-25 MED ORDER — SODIUM CHLORIDE 0.9 % IR SOLN
Status: DC | PRN
  Administered 2012-01-25: 3000 mL

## 2012-01-25 MED ORDER — ARTIFICIAL TEARS OP OINT
TOPICAL_OINTMENT | OPHTHALMIC | Status: DC | PRN
  Administered 2012-01-25: 1 via OPHTHALMIC

## 2012-01-25 MED ORDER — GLYCOPYRROLATE 0.2 MG/ML IJ SOLN
INTRAMUSCULAR | Status: DC | PRN
  Administered 2012-01-25: 0.2 mg via INTRAVENOUS
  Administered 2012-01-25: .6 mg via INTRAVENOUS

## 2012-01-25 MED ORDER — CHLORHEXIDINE GLUCONATE 4 % EX LIQD: 60.0000 mL | Freq: Once | CUTANEOUS | Status: DC

## 2012-01-25 MED ORDER — DEXAMETHASONE SODIUM PHOSPHATE 4 MG/ML IJ SOLN
INTRAMUSCULAR | Status: DC | PRN
  Administered 2012-01-25: 8 mg via INTRAVENOUS

## 2012-01-25 MED ORDER — TOBRAMYCIN SULFATE 1.2 G IJ SOLR
INTRAMUSCULAR | Status: AC
  Filled 2012-01-25: qty 1.2

## 2012-01-25 MED ORDER — HYDROMORPHONE HCL PF 1 MG/ML IJ SOLN
0.2500 mg | INTRAMUSCULAR | Status: DC | PRN
  Administered 2012-01-25 (×6): 0.5 mg via INTRAVENOUS

## 2012-01-25 MED ORDER — PANTOPRAZOLE SODIUM 40 MG PO TBEC
40.0000 mg | DELAYED_RELEASE_TABLET | Freq: Every day | ORAL | Status: DC
  Administered 2012-01-26: 40 mg via ORAL
  Filled 2012-01-25: qty 1

## 2012-01-25 MED ORDER — ACETAMINOPHEN 650 MG RE SUPP: 650.0000 mg | Freq: Four times a day (QID) | RECTAL | Status: DC | PRN

## 2012-01-25 MED ORDER — DIPHENHYDRAMINE HCL 12.5 MG/5ML PO ELIX: 12.5000 mg | ORAL_SOLUTION | ORAL | Status: DC | PRN

## 2012-01-25 MED ORDER — OXYCODONE HCL 5 MG/5ML PO SOLN: 5.0000 mg | Freq: Once | ORAL | Status: AC | PRN

## 2012-01-25 MED ORDER — GABAPENTIN 300 MG PO CAPS
300.0000 mg | ORAL_CAPSULE | Freq: Three times a day (TID) | ORAL | Status: DC
  Administered 2012-01-25 – 2012-01-26 (×3): 300 mg via ORAL
  Filled 2012-01-25 (×4): qty 1

## 2012-01-25 MED ORDER — POTASSIUM CHLORIDE IN NACL 20-0.9 MEQ/L-% IV SOLN
INTRAVENOUS | Status: DC
  Administered 2012-01-25: 1000 mL via INTRAVENOUS
  Administered 2012-01-26: 06:00:00 via INTRAVENOUS
  Filled 2012-01-25 (×5): qty 1000

## 2012-01-25 MED ORDER — MENTHOL 3 MG MT LOZG: 1.0000 | LOZENGE | OROMUCOSAL | Status: DC | PRN

## 2012-01-25 MED ORDER — DOCUSATE SODIUM 100 MG PO CAPS
100.0000 mg | ORAL_CAPSULE | Freq: Two times a day (BID) | ORAL | Status: DC
  Administered 2012-01-25 – 2012-01-26 (×2): 100 mg via ORAL
  Filled 2012-01-25 (×3): qty 1

## 2012-01-25 MED ORDER — DEXAMETHASONE SODIUM PHOSPHATE 10 MG/ML IJ SOLN
10.0000 mg | Freq: Three times a day (TID) | INTRAMUSCULAR | Status: DC
  Filled 2012-01-25 (×3): qty 1

## 2012-01-25 MED ORDER — METOCLOPRAMIDE HCL 10 MG PO TABS: 5.0000 mg | ORAL_TABLET | Freq: Three times a day (TID) | ORAL | Status: DC | PRN

## 2012-01-25 MED ORDER — METOCLOPRAMIDE HCL 5 MG/ML IJ SOLN: 5.0000 mg | Freq: Three times a day (TID) | INTRAMUSCULAR | Status: DC | PRN

## 2012-01-25 MED ORDER — ACETAMINOPHEN 10 MG/ML IV SOLN
1000.0000 mg | Freq: Once | INTRAVENOUS | Status: AC
  Administered 2012-01-25: 1000 mg via INTRAVENOUS

## 2012-01-25 MED ORDER — OXYCODONE HCL 5 MG PO TABS
5.0000 mg | ORAL_TABLET | ORAL | Status: DC | PRN
  Administered 2012-01-25 – 2012-01-26 (×5): 10 mg via ORAL
  Filled 2012-01-25 (×5): qty 2

## 2012-01-25 MED ORDER — EPHEDRINE SULFATE 50 MG/ML IJ SOLN
INTRAMUSCULAR | Status: DC | PRN
  Administered 2012-01-25: 5 mg via INTRAVENOUS
  Administered 2012-01-25: 10 mg via INTRAVENOUS

## 2012-01-25 MED ORDER — CEFUROXIME SODIUM 1.5 G IJ SOLR
INTRAMUSCULAR | Status: AC
  Filled 2012-01-25: qty 1.5

## 2012-01-25 MED ORDER — ONDANSETRON HCL 4 MG/2ML IJ SOLN
INTRAMUSCULAR | Status: DC | PRN
  Administered 2012-01-25: 4 mg via INTRAVENOUS

## 2012-01-25 MED ORDER — POVIDONE-IODINE 7.5 % EX SOLN
Freq: Once | CUTANEOUS | Status: DC
  Filled 2012-01-25: qty 118

## 2012-01-25 MED ORDER — NEOSTIGMINE METHYLSULFATE 1 MG/ML IJ SOLN
INTRAMUSCULAR | Status: DC | PRN
  Administered 2012-01-25: 4 mg via INTRAVENOUS

## 2012-01-25 MED ORDER — OXYCODONE HCL 5 MG PO TABS
5.0000 mg | ORAL_TABLET | Freq: Once | ORAL | Status: AC | PRN
  Administered 2012-01-25: 5 mg via ORAL

## 2012-01-25 MED ORDER — VANCOMYCIN HCL 1000 MG IV SOLR
750.0000 mg | Freq: Two times a day (BID) | INTRAVENOUS | Status: AC
  Administered 2012-01-25: 750 mg via INTRAVENOUS
  Filled 2012-01-25 (×3): qty 750

## 2012-01-25 MED ORDER — ACETAMINOPHEN 325 MG PO TABS
650.0000 mg | ORAL_TABLET | Freq: Four times a day (QID) | ORAL | Status: DC | PRN
  Administered 2012-01-26: 650 mg via ORAL
  Filled 2012-01-25: qty 2

## 2012-01-25 MED ORDER — ACETAMINOPHEN 10 MG/ML IV SOLN
INTRAVENOUS | Status: AC
  Filled 2012-01-25: qty 100

## 2012-01-25 MED ORDER — LIDOCAINE HCL 4 % MT SOLN
OROMUCOSAL | Status: DC | PRN
  Administered 2012-01-25: 4 mL via TOPICAL

## 2012-01-25 MED ORDER — PHENOL 1.4 % MT LIQD: 1.0000 | OROMUCOSAL | Status: DC | PRN

## 2012-01-25 MED ORDER — LIDOCAINE HCL (CARDIAC) 20 MG/ML IV SOLN
INTRAVENOUS | Status: DC | PRN
  Administered 2012-01-25: 30 mg via INTRAVENOUS

## 2012-01-25 MED ORDER — TOBRAMYCIN SULFATE 1.2 G IJ SOLR
INTRAMUSCULAR | Status: DC | PRN
  Administered 2012-01-25: 1.2 g

## 2012-01-25 MED ORDER — DIAZEPAM 5 MG/ML IJ SOLN
INTRAMUSCULAR | Status: AC
  Filled 2012-01-25: qty 2

## 2012-01-25 MED ORDER — HYDROMORPHONE HCL PF 1 MG/ML IJ SOLN
0.5000 mg | INTRAMUSCULAR | Status: DC | PRN
  Administered 2012-01-25 – 2012-01-26 (×2): 1 mg via INTRAVENOUS
  Filled 2012-01-25 (×2): qty 1

## 2012-01-25 MED ORDER — ALPRAZOLAM 0.5 MG PO TABS
0.5000 mg | ORAL_TABLET | Freq: Three times a day (TID) | ORAL | Status: DC | PRN
  Administered 2012-01-26: 0.5 mg via ORAL
  Filled 2012-01-25: qty 1

## 2012-01-25 MED ORDER — ACETAMINOPHEN 10 MG/ML IV SOLN
1000.0000 mg | Freq: Four times a day (QID) | INTRAVENOUS | Status: AC
  Administered 2012-01-25 (×3): 1000 mg via INTRAVENOUS
  Filled 2012-01-25 (×3): qty 100

## 2012-01-25 MED ORDER — ONDANSETRON HCL 4 MG PO TABS: 4.0000 mg | ORAL_TABLET | Freq: Four times a day (QID) | ORAL | Status: DC | PRN

## 2012-01-25 MED ORDER — ALBUTEROL SULFATE HFA 108 (90 BASE) MCG/ACT IN AERS
INHALATION_SPRAY | RESPIRATORY_TRACT | Status: DC | PRN
  Administered 2012-01-25: 2 via RESPIRATORY_TRACT

## 2012-01-25 MED ORDER — ALUM & MAG HYDROXIDE-SIMETH 200-200-20 MG/5ML PO SUSP: 30.0000 mL | ORAL | Status: DC | PRN

## 2012-01-25 MED ORDER — PROMETHAZINE HCL 25 MG/ML IJ SOLN: 6.2500 mg | INTRAMUSCULAR | Status: DC | PRN

## 2012-01-25 SURGICAL SUPPLY — 63 items
AUTOTRANSFUSION W/QD PVC DRAIN (AUTOTRANSFUSION) IMPLANT
BANDAGE ELASTIC 4 VELCRO ST LF (GAUZE/BANDAGES/DRESSINGS) ×2 IMPLANT
BANDAGE ELASTIC 6 VELCRO ST LF (GAUZE/BANDAGES/DRESSINGS) ×2 IMPLANT
BANDAGE ESMARK 6X9 LF (GAUZE/BANDAGES/DRESSINGS) ×1 IMPLANT
BNDG ESMARK 6X9 LF (GAUZE/BANDAGES/DRESSINGS) ×2
BOWL SMART MIX CTS (DISPOSABLE) ×2 IMPLANT
CEMENT HV SMART SET (Cement) ×2 IMPLANT
CLOTH BEACON ORANGE TIMEOUT ST (SAFETY) ×2 IMPLANT
CLSR STERI-STRIP ANTIMIC 1/2X4 (GAUZE/BANDAGES/DRESSINGS) ×2 IMPLANT
COVER BACK TABLE 24X17X13 BIG (DRAPES) IMPLANT
COVER SURGICAL LIGHT HANDLE (MISCELLANEOUS) ×2 IMPLANT
CUFF TOURNIQUET SINGLE 34IN LL (TOURNIQUET CUFF) ×2 IMPLANT
CUFF TOURNIQUET SINGLE 44IN (TOURNIQUET CUFF) IMPLANT
DRAPE EXTREMITY T 121X128X90 (DRAPE) ×2 IMPLANT
DRAPE INCISE IOBAN 66X45 STRL (DRAPES) ×2 IMPLANT
DRAPE PROXIMA HALF (DRAPES) ×2 IMPLANT
DRAPE U-SHAPE 47X51 STRL (DRAPES) ×2 IMPLANT
DRSG ADAPTIC 3X8 NADH LF (GAUZE/BANDAGES/DRESSINGS) ×2 IMPLANT
DRSG PAD ABDOMINAL 8X10 ST (GAUZE/BANDAGES/DRESSINGS) ×4 IMPLANT
DURAPREP 26ML APPLICATOR (WOUND CARE) ×2 IMPLANT
ELECT CAUTERY BLADE 6.4 (BLADE) ×2 IMPLANT
ELECT REM PT RETURN 9FT ADLT (ELECTROSURGICAL) ×2
ELECTRODE REM PT RTRN 9FT ADLT (ELECTROSURGICAL) ×1 IMPLANT
EVACUATOR 1/8 PVC DRAIN (DRAIN) IMPLANT
FACESHIELD LNG OPTICON STERILE (SAFETY) ×2 IMPLANT
GLOVE BIO SURGEON STRL SZ7 (GLOVE) IMPLANT
GLOVE BIOGEL PI IND STRL 7.0 (GLOVE) ×1 IMPLANT
GLOVE BIOGEL PI IND STRL 7.5 (GLOVE) ×1 IMPLANT
GLOVE BIOGEL PI INDICATOR 7.0 (GLOVE) ×1
GLOVE BIOGEL PI INDICATOR 7.5 (GLOVE) ×1
GLOVE SS BIOGEL STRL SZ 7.5 (GLOVE) IMPLANT
GLOVE SUPERSENSE BIOGEL SZ 7.5 (GLOVE)
GOWN PREVENTION PLUS XLARGE (GOWN DISPOSABLE) ×4 IMPLANT
GOWN STRL NON-REIN LRG LVL3 (GOWN DISPOSABLE) IMPLANT
GOWN STRL REIN XL XLG (GOWN DISPOSABLE) ×2 IMPLANT
HANDPIECE INTERPULSE COAX TIP (DISPOSABLE) ×1
HOOD PEEL AWAY FACE SHEILD DIS (HOOD) ×6 IMPLANT
IMMOBILIZER KNEE 22 UNIV (SOFTGOODS) ×2 IMPLANT
KIT BASIN OR (CUSTOM PROCEDURE TRAY) ×2 IMPLANT
KIT ROOM TURNOVER OR (KITS) ×2 IMPLANT
KIT SAW BLADE (KITS) ×2 IMPLANT
MANIFOLD NEPTUNE II (INSTRUMENTS) ×2 IMPLANT
NS IRRIG 1000ML POUR BTL (IV SOLUTION) ×2 IMPLANT
PACK TOTAL JOINT (CUSTOM PROCEDURE TRAY) ×2 IMPLANT
PAD ARMBOARD 7.5X6 YLW CONV (MISCELLANEOUS) ×4 IMPLANT
PAD CAST 4YDX4 CTTN HI CHSV (CAST SUPPLIES) ×1 IMPLANT
PADDING CAST COTTON 4X4 STRL (CAST SUPPLIES) ×1
PADDING CAST COTTON 6X4 STRL (CAST SUPPLIES) ×2 IMPLANT
RUBBERBAND STERILE (MISCELLANEOUS) ×2 IMPLANT
SET HNDPC FAN SPRY TIP SCT (DISPOSABLE) ×1 IMPLANT
SPONGE GAUZE 4X4 12PLY (GAUZE/BANDAGES/DRESSINGS) ×2 IMPLANT
STRIP CLOSURE SKIN 1/2X4 (GAUZE/BANDAGES/DRESSINGS) ×2 IMPLANT
SUCTION FRAZIER TIP 10 FR DISP (SUCTIONS) ×2 IMPLANT
SUT ETHIBOND NAB CT1 #1 30IN (SUTURE) ×2 IMPLANT
SUT MNCRL AB 3-0 PS2 18 (SUTURE) ×2 IMPLANT
SUT VIC AB 0 CT1 27 (SUTURE) ×1
SUT VIC AB 0 CT1 27XBRD ANBCTR (SUTURE) ×1 IMPLANT
SUT VIC AB 2-0 CT1 27 (SUTURE) ×1
SUT VIC AB 2-0 CT1 TAPERPNT 27 (SUTURE) ×1 IMPLANT
TOWEL OR 17X24 6PK STRL BLUE (TOWEL DISPOSABLE) ×2 IMPLANT
TOWEL OR 17X26 10 PK STRL BLUE (TOWEL DISPOSABLE) ×2 IMPLANT
TRAY FOLEY CATH 14FR (SET/KITS/TRAYS/PACK) IMPLANT
WATER STERILE IRR 1000ML POUR (IV SOLUTION) IMPLANT

## 2012-01-25 NOTE — Anesthesia Procedure Notes (Signed)
Procedure Name: Intubation Date/Time: 01/25/2012 7:28 AM Performed by: Luster Landsberg Pre-anesthesia Checklist: Patient identified, Emergency Drugs available, Suction available and Patient being monitored Patient Re-evaluated:Patient Re-evaluated prior to inductionOxygen Delivery Method: Circle system utilized Preoxygenation: Pre-oxygenation with 100% oxygen Ventilation: Mask ventilation without difficulty and Oral airway inserted - appropriate to patient size Laryngoscope Size: Mac and 3 Grade View: Grade II Tube type: Oral Tube size: 7.0 mm Number of attempts: 1 Airway Equipment and Method: Stylet Placement Confirmation: ETT inserted through vocal cords under direct vision,  positive ETCO2 and breath sounds checked- equal and bilateral Secured at: 21 cm Tube secured with: Tape Dental Injury: Teeth and Oropharynx as per pre-operative assessment

## 2012-01-25 NOTE — Interval H&P Note (Signed)
History and Physical Interval Note:  01/25/2012 7:06 AM  Claudia Murphy  has presented today for surgery, with the diagnosis of Osteoarthritis left knee, Degenerative Arthritis-Leg/Knee Primary Diagnosis 715.16  The various methods of treatment have been discussed with the patient and family. After consideration of risks, benefits and other options for treatment, the patient has consented to  Procedure(s) (LRB) with comments: UNICOMPARTMENTAL KNEE (Left) - LEFT KNEE UNICOMPARTMENTAL, ARTHROPLASTY KNEE CONDYLE/PLATEAU MEDIAL COMPARTMENT as a surgical intervention .  The patient's history has been reviewed, patient examined, no change in status, stable for surgery.  I have reviewed the patient's chart and labs.  Questions were answered to the patient's satisfaction.     [name]

## 2012-01-25 NOTE — Evaluation (Signed)
Physical Therapy Evaluation Patient Details Name: Claudia Murphy MRN: [id number] DOB: [birth year] Today's Date: 01/25/2012 Time: 1191-4782 PT Time Calculation (min): 40 min  PT Assessment / Plan / Recommendation Clinical Impression  Patient is a 56 yo female s/p Lt. unicompartmental knee arthroplasty.  Should progress well with mobility.  Patient will benefit from acute PT to maximize independence prior to return home with husband.    PT Assessment  Patient needs continued PT services    Follow Up Recommendations  Home health PT;Supervision/Assistance - 24 hour    Does the patient have the potential to tolerate intense rehabilitation      Barriers to Discharge None      Equipment Recommendations  None recommended by PT    Recommendations for Other Services     Frequency 7X/week    Precautions / Restrictions Precautions Precautions: Knee Precaution Booklet Issued: Yes (comment) Precaution Comments: Reviewed precautions with patient and husband Required Braces or Orthoses: Knee Immobilizer - Left Knee Immobilizer - Left: On except when in CPM;Discontinue post op day 2 Restrictions Weight Bearing Restrictions: Yes LLE Weight Bearing: Weight bearing as tolerated   Pertinent Vitals/Pain       Mobility  Bed Mobility Bed Mobility: Supine to Sit;Sitting - Scoot to Edge of Bed;Sit to Supine Supine to Sit: 4: Min assist;HOB elevated Sitting - Scoot to Edge of Bed: 4: Min guard;With rail Sit to Supine: 4: Min assist;HOB elevated Details for Bed Mobility Assistance: Verbal cues for technique.  Assist to move LLE off of and onto bed.  Max assist to don KI - instructed patient and husband. Transfers Transfers: Sit to Stand;Stand to Sit Sit to Stand: 4: Min assist;With upper extremity assist;From bed Stand to Sit: 4: Min assist;With upper extremity assist;To bed Details for Transfer Assistance: Verbal cues for hand placement and placement of LLE during transfers.  Verbal cues for  technique. Ambulation/Gait Ambulation/Gait Assistance: Not tested (comment)       Exercises Total Joint Exercises Ankle Circles/Pumps: AROM;Both;10 reps;Supine   PT Diagnosis: Difficulty walking;Acute pain  PT Problem List: Decreased strength;Decreased range of motion;Decreased activity tolerance;Decreased mobility;Decreased knowledge of precautions;Pain PT Treatment Interventions: DME instruction;Gait training;Stair training;Functional mobility training;Therapeutic exercise;Patient/family education   PT Goals Acute Rehab PT Goals PT Goal Formulation: With patient Time For Goal Achievement: 02/01/12 Potential to Achieve Goals: Good Pt will go Supine/Side to Sit: Independently;with HOB 0 degrees PT Goal: Supine/Side to Sit - Progress: Goal set today Pt will go Sit to Supine/Side: Independently;with HOB 0 degrees PT Goal: Sit to Supine/Side - Progress: Goal set today Pt will go Sit to Stand: with supervision;with upper extremity assist PT Goal: Sit to Stand - Progress: Goal set today Pt will Ambulate: >150 feet;with supervision;with rolling walker PT Goal: Ambulate - Progress: Goal set today Pt will Go Up / Down Stairs: 3-5 stairs;with supervision;with rail(s);with least restrictive assistive device PT Goal: Up/Down Stairs - Progress: Goal set today Pt will Perform Home Exercise Program: with supervision, verbal cues required/provided PT Goal: Perform Home Exercise Program - Progress: Goal set today  Visit Information  Last PT Received On: 01/25/12 Assistance Needed: +1    Subjective Data  Subjective: "I don't want to be down.  Let's get up" Patient Stated Goal: To return home and return to work.   Prior Functioning  Home Living Lives With: Spouse Available Help at Discharge: Family;Available 24 hours/day (for 2 weeks) Type of Home: House Home Access: Stairs to enter [employer] of Steps: 3 Entrance Stairs-Rails: Right;Left Home Layout:  One level Bathroom  Shower/Tub: Walk-in shower;Tub/shower unit Bathroom Toilet: Standard Home Adaptive Equipment: Walker - rolling;Bedside commode/3-in-1;Shower chair with back Prior Function Level of Independence: Independent Able to Take Stairs?: Yes Driving: Yes Vocation: Full time employment Communication Communication: No difficulties    Cognition  Overall Cognitive Status: Appears within functional limits for tasks assessed/performed Arousal/Alertness: Awake/alert Orientation Level: Oriented X4 / Intact Behavior During Session: [facility] for tasks performed    Extremity/Trunk Assessment Right Upper Extremity Assessment RUE ROM/Strength/Tone: WFL for tasks assessed RUE Sensation: WFL - Light Touch Left Upper Extremity Assessment LUE ROM/Strength/Tone: WFL for tasks assessed LUE Sensation: WFL - Light Touch Right Lower Extremity Assessment RLE ROM/Strength/Tone: WFL for tasks assessed RLE Sensation: WFL - Light Touch Left Lower Extremity Assessment LLE ROM/Strength/Tone: Deficits;Unable to fully assess;Due to pain LLE ROM/Strength/Tone Deficits: Able to assist with moving LLE off of and onto bed. Trunk Assessment Trunk Assessment: Normal   Balance Balance Balance Assessed: Yes Static Sitting Balance Static Sitting - Balance Support: No upper extremity supported;Feet supported Static Sitting - Level of Assistance: 5: Stand by assistance Static Sitting - Comment/# of Minutes: Patient able to maintain upright posture with good balance x 8 minutes. Static Standing Balance Static Standing - Balance Support: Bilateral upper extremity supported Static Standing - Level of Assistance: 5: Stand by assistance Static Standing - Comment/# of Minutes: Patient able to maintain balance in standing x 4 minutes using RW for support.  End of Session PT - End of Session Equipment Utilized During Treatment: Gait belt;Left knee immobilizer Activity Tolerance: Patient tolerated treatment well;Patient limited by  pain Patient left: in bed;with call bell/phone within reach;with family/visitor present Nurse Communication: Mobility status CPM Left Knee CPM Left Knee: Off  GP     Vena Austria 01/25/2012, 8:25 PM Durenda Hurt. Renaldo Fiddler, [facility] Acute Rehab Services Pager [phone]

## 2012-01-25 NOTE — Brief Op Note (Signed)
01/25/2012  9:09 AM  PATIENT:  Pierre Bali  56 y.o. female  PRE-OPERATIVE DIAGNOSIS:  Osteoarthritis left knee, Degenerative Arthritis-Leg/Knee   POST-OPERATIVE DIAGNOSIS:  Osteoarthritis left knee, Degenerative Arthritis-Leg/Knee   PROCEDURE:  Procedure(s) (LRB) with comments: UNICOMPARTMENTAL KNEE (Left) - LEFT KNEE UNICOMPARTMENTAL ARTHROPLASTY   SURGEON:  Surgeon(s) and Role:    * Nilda Simmer, MD - Primary    * Eulas Post, MD - Assisting  PHYSICIAN ASSISTANT: Skip Mayer PA-C  ASSISTANTS: Teryl Lucy MD  Skip Mayer A=PA-C   ANESTHESIA:   general  EBL:  Total I/O In: 1000 [I.V.:1000] Out: 100 [Urine:100]  BLOOD ADMINISTERED:none  DRAINS: (1) Hemovact drain(s) in the left knee with  Suction Clamped   LOCAL MEDICATIONS USED:  NONE  SPECIMEN:  No Specimen  DISPOSITION OF SPECIMEN:  N/A  COUNTS:  YES  TOURNIQUET:  * Missing tourniquet times found for documented tourniquets in log:  11914 *  DICTATION: .Note written in EPIC  PLAN OF CARE: Admit to inpatient   PATIENT DISPOSITION:  PACU - hemodynamically stable.   Delay start of Pharmacological VTE agent (>24hrs) due to surgical blood loss or risk of bleeding: no

## 2012-01-25 NOTE — H&P (View-Only) (Signed)
Claudia Murphy is an 56 y.o. female.   Chief Complaint: left knee pain HPI: Claudia Murphy comes in today to discuss left knee pain.  Claudia Murphy is a well-developed, well-nourished 56 year-old female who has failed anti-inflammatories and intraarticular Cortisone injections.  Claudia Murphy currently has medial compartment end stage DJD.  He ACL is intact.  Claudia Murphy is here to discuss a unicompartmental total knee replacement. Past medical history: Significant for breast cancer, osteoporosis, carpal tunnel syndrome, infiltrating ductal carcinoma of the breast and cataract.    Past Medical History  Diagnosis Date  . Breast cancer   . Osteoporosis   . Carpal tunnel syndrome on both sides   . 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 Cataract mature, total senile   . PONV (postoperative nausea and vomiting)   . Anxiety   . GERD (gastroesophageal reflux disease)   . Left knee DJD     Past Surgical History  Procedure Date  . Tubal ligation   . Knee arthroscopy   . Mastectomy 01/07    left  . Foot surgery Nov 25 2010    plantar fascitis  . Injection knee 2013    lt    Family History  Problem Relation Age of Onset  . Diabetes Mother   . Heart disease Father   . Diabetes Son   . Hypertension Father   . Hypertension Mother    Social History:  reports that Claudia Murphy quit smoking about 7 years ago. Claudia Murphy does not have any smokeless tobacco history on file. Claudia Murphy reports that Claudia Murphy does not drink alcohol or use illicit drugs.  Allergies:  Allergies  Allergen Reactions  . Methocarbamol Shortness Of Breath  . Epirubicin     Skin toxicity  . Penicillins Rash   Current Outpatient Prescriptions on File Prior to Visit  Medication Sig Dispense Refill  . ALPRAZolam (XANAX) 0.5 MG tablet Take 0.5 mg by mouth 3 (three) times daily as needed. For anxiety      . aspirin 81 MG tablet Take 81 mg by mouth daily.        . calcium-vitamin D (OSCAL WITH D) 500-200 MG-UNIT per tablet Take 1 tablet by mouth 2 (two)  times daily.       Marland Kitchen gabapentin (NEURONTIN) 300 MG capsule Take 300 mg by mouth 3 (three) times daily.      Marland Kitchen HYDROcodone-acetaminophen (NORCO) 7.5-325 MG per tablet Take 1 tablet by mouth every 6 (six) hours as needed. For pain      . ibandronate (BONIVA) 150 MG tablet Take 150 mg by mouth every 30 (thirty) days. On the 29th of each month; Take in the morning with a full glass of water, on an empty stomach, and do not take anything else by mouth or lie down for the next 30 min.      . meloxicam (MOBIC) 15 MG tablet Take 15 mg by mouth daily.        Marland Kitchen dexlansoprazole (DEXILANT) 60 MG capsule Take 60 mg by mouth daily as needed. For acid reflux         (Not in a hospital admission)  No results found for this or any previous visit (from the past 48 hour(s)). No results found.  Review of Systems  Constitutional: Negative.   HENT: Negative.   Eyes: Negative.   Respiratory: Negative.   Cardiovascular: Negative.   Gastrointestinal: Negative.   Genitourinary: Negative.   Musculoskeletal: Positive for joint pain.       Left  knee djd  Skin: Negative.   Neurological: Negative.   Endo/Heme/Allergies: Negative.     Blood pressure 156/93, pulse 79, temperature 98.1 F (36.7 C), height 5\' 2"  (1.575 m), weight 78.472 kg (173 lb), SpO2 100.00%. Physical Exam  Constitutional: Claudia Murphy is oriented to person, place, and time. Claudia Murphy appears well-developed and well-nourished.  HENT:  Head: Normocephalic and atraumatic.  Eyes: Conjunctivae normal and EOM are normal. Pupils are equal, round, and reactive to light.  Neck: Normal range of motion.  Cardiovascular: Normal rate and regular rhythm.   Respiratory: Effort normal and breath sounds normal.  GI: Soft. Bowel sounds are normal.  Genitourinary:       Not pertinent to current symptomatology therefore not examined.  Musculoskeletal:       Left knee has medial joint line tenderness.  1+ synovitis.  1+ crepitation.  Range of motion -5 to 120 degrees with  a varus deformity that corrects easy with stressing.  Knee is stable with normal patella tracking.  Right knee has full range of motion without pain, swelling, weakness or deformity.  Claudia Murphy has 2+ dorsalis pedis pulses.  Skin warm and dry without rashes or abrasions.  Claudia Murphy has normal mood and affect.   Neurological: Claudia Murphy is alert and oriented to person, place, and time.  Skin: Skin is warm and dry.  Psychiatric: Claudia Murphy has a normal mood and affect. Claudia Murphy behavior is normal. Judgment and thought content normal.    X-RAYS: X-rays show overall mild varus deformity that corrects with stressing.  Bone on bone osteoarthritis with periarticular spurring and subchondral sclerosis.    Assessment Patient Active Problem List  Diagnosis  . Infiltrating ductal carcinoma of left breast, stage 1  . Breast cancer  . Carpal tunnel syndrome on both sides  . Cataract mature, total senile  . Left knee DJD   Plan At this point in time risks, benefits and possible complications have been discussed in detail with the patient.  Claudia Murphy is without question.  Claudia Murphy is planning on staying overnight in the hospital for one day and being discharged to home, but Claudia Murphy will be admitted as an inpatient, as this is an inpatient only procedure.    Sanaa Zilberman J 01/13/2012, 5:13 PM

## 2012-01-25 NOTE — Op Note (Signed)
NAME:  Claudia Murphy, Claudia Murphy                 ACCOUNT NO.:  [id number]  MEDICAL RECORD NO.:  [id number]  LOCATION:  5N01C                        FACILITY:  [facility]  PHYSICIAN:  Elana Alm. Thurston Hole, M.D. DATE OF BIRTH:  1955/03/11  DATE OF PROCEDURE:  01/25/2012 DATE OF DISCHARGE:                              OPERATIVE REPORT   PREOPERATIVE DIAGNOSIS:  Left knee medial compartment degenerative joint disease.  POSTOPERATIVE DIAGNOSES:  Left knee medial compartment degenerative joint disease.  PROCEDURE: 1. Left knee unicompartmental arthroplasty using Oxford cemented     system with size small cemented femoral component with size A     cemented tibial component with #4 polyethylene insert. 2. Tobramycin impregnated cement.  SURGEON:  Elana Alm. Thurston Hole, M.D.  ASSISTANT:  Eulas Post, MD and Skip Mayer, [place]  ANESTHESIA:  General.  OPERATIVE TIME:  1 hour and 20 minutes.  COMPLICATIONS:  None.  DESCRIPTION OF PROCEDURE:  Ms. Byers was brought to the operating room on January 25, 2012, after a femoral nerve block was placed in the holding room by anesthesia.  She was placed on the operative table in supine position.  She received vancomycin 1 g IV preoperatively for prophylaxis.  After being placed under general anesthesia, she had a Foley catheter placed under sterile conditions.  Latex precautions were carried out.  Her left knee was examined.  She had range of motion from 0-125 degrees, mild varus deformity, knee stable, ligamentous exam with normal patellar tracking.  The left leg was prepped using sterile DuraPrep and draped using sterile technique.  Time-out procedure was called and the correct left knee identified.  Left leg was exsanguinated and a thigh tourniquet elevated to 365 mm.  Initially, through a 5-6 cm anteromedial incision, an initial exposure was made.  The underlying subcutaneous tissues were incised along with skin incision.  A median arthrotomy was carried out,  revealing an excess amount of normal- appearing joint fluid.  The articular surfaces were inspected.  She had grade 4 changes medially.  She had no articular cartilage damage in the patellofemoral joint or the lateral compartment.  The ACL and PCL were intact as well as the collateral ligaments.  At this point, the medial meniscal remnant was removed.  A small sizing spoon was placed medially, found to be an excellent fit and this was used as a jig for the proximal tibial resection.  The proximal tibial cutting jig was then placed, attached to this sizing spoon and first a vertical cut was made just medial to the ACL and then horizontal cut thus resecting 7 mm off of the spoon, which was off of the articular surface of the medial femoral condyle.  The proximal tibial piece was tested for size, found to be the size A.  At this point, the intramedullary drill was drilled up the femoral canal and the intramedullary rod was placed and then the distal femoral jig was placed, attached to this intramedullary femoral rod and two drill holes were placed in the central portion of the distal femur and then the 0 Spigot was used to ream the initial reaming on the distal femur.  After this was done with the size  8 tibial tray in place and the small femoral trial, a sizing spoon was placed in 90 degrees of flexion, which was excellent, but in 20 degrees of flexion, only 1 mm sizer would fit and thus, the #2 Spigot was replaced and re-milled on the distal femur.  At this point, with the trials back in place, the #4 sizing spoon was an excellent fit and not over tight in both 90 degrees of flexion and 20 degrees of flexion.  At this point, then the tibial keel cut was made with the toothbrush saw and the size A tibial keeled trial was hammered in position with an excellent fit.  After this was done, then the impingement reamer was used to ream the distal femur both anterior and posterior.  After this was  done, then the small femoral trial was placed and with the size 8 tibial trial and the #4 polyethylene trial insert, knee was taken through a full range of motion and found to be stable with excellent correction of her varus deformity. At this point, it was felt that all the trial components were of excellent size, fit, and stability.  They were then removed.  The knee was then jet lavage, irrigated with 3 liters of saline.  The proximal tibia size A with cement backing with tobramycin impregnated cement was then placed and hammered into position with an excellent fit with excess cement being removed from around the edges.  The small femoral component with cement backing was hammered in position also with an excellent fit with excess cement being removed from around the edges.  The #4 polyethylene insert was then placed in each angle through full range of motion and found to be stable.  At this point, it was felt that all the components were of excellent size, fit, and stability.  The wound was further irrigated with saline.  Intraoperative fluoroscopy showed excellent position of the prosthesis.  At this point, the arthrotomy was then closed with #1 Ethibond suture over medium Hemovac drain. Subcutaneous tissues were closed with 0 and 2-0 Vicryl, subcuticular layer was closed with 4-0 Monocryl.  Sterile dressings were applied, and a long-leg splint and the patient awakened, extubated, and taken to recovery room in stable condition.  Needle and sponge counts were correct x2 at the end of the case.     Bach Rocchi A. Thurston Hole, M.D.     RAW/MEDQ  D:  01/25/2012  T:  01/25/2012  Job:  [id number]

## 2012-01-25 NOTE — Preoperative (Signed)
Beta Blockers   Reason not to administer Beta Blockers:Not Applicable 

## 2012-01-25 NOTE — Transfer of Care (Signed)
Immediate Anesthesia Transfer of Care Note  Patient: Claudia Murphy  Procedure(s) Performed: Procedure(s) (LRB) with comments: UNICOMPARTMENTAL KNEE (Left) - LEFT KNEE UNICOMPARTMENTAL ARTHROPLASTY   Patient Location: PACU  Anesthesia Type: General  Level of Consciousness: awake and alert   Airway & Oxygen Therapy: Patient Spontanous Breathing and Patient connected to nasal cannula oxygen  Post-op Assessment: Report given to PACU RN and Post -op Vital signs reviewed and stable  Post vital signs: Reviewed and stable  Complications: No apparent anesthesia complications

## 2012-01-25 NOTE — Anesthesia Postprocedure Evaluation (Signed)
  Anesthesia Post-op Note  Patient: Claudia Murphy  Procedure(s) Performed: Procedure(s) (LRB) with comments: UNICOMPARTMENTAL KNEE (Left) - LEFT KNEE UNICOMPARTMENTAL ARTHROPLASTY   Patient Location: PACU  Anesthesia Type:GA combined with regional for post-op pain  Level of Consciousness: awake and alert   Airway and Oxygen Therapy: Patient Spontanous Breathing  Post-op Pain: mild  Post-op Assessment: Post-op Vital signs reviewed, Patient's Cardiovascular Status Stable, Respiratory Function Stable, Patent Airway, No signs of Nausea or vomiting and Pain level controlled  Post-op Vital Signs: stable  Complications: No apparent anesthesia complications

## 2012-01-25 NOTE — Progress Notes (Signed)
ANTIBIOTIC CONSULT NOTE - INITIAL  Pharmacy Consult for Vancomycin Indication: Post op prophylaxis for total hip replacement  Allergies  Allergen Reactions  . Epirubicin Other (See Comments)    Skin toxicity "hands started peeling and turning real red" (01/25/2012)  . Latex Other (See Comments)    Blisters  . Methocarbamol Shortness Of Breath  . Penicillins Rash and Other (See Comments)    "turned real real red; took 1 pill and ended up in hospital for 2 wks; in the 1980's" (01/25/2012)    Patient Measurements: Height: 5' 1.81" (157 cm) Weight: 168 lb 3.4 oz (76.3 kg) IBW/kg (Calculated) : 49.67  Adjusted Body Weight:   Vital Signs: Temp: 97.8 F (36.6 C) (12/09 1443) Temp src: Oral (12/09 0609) BP: 138/75 mmHg (12/09 1443) Pulse Rate: 84  (12/09 1415) Intake/Output from previous day:   Intake/Output from this shift: Total I/O In: 2200 [I.V.:2200] Out: 1000 [Urine:1000]  Labs: No results found for this basename: WBC:3,HGB:3,PLT:3,LABCREA:3,CREATININE:3 in the last 72 hours Estimated Creatinine Clearance: 75.6 ml/min (by C-G formula based on Cr of 0.56). No results found for this basename: VANCOTROUGH:2,VANCOPEAK:2,VANCORANDOM:2,GENTTROUGH:2,GENTPEAK:2,GENTRANDOM:2,TOBRATROUGH:2,TOBRAPEAK:2,TOBRARND:2,AMIKACINPEAK:2,AMIKACINTROU:2,AMIKACIN:2, in the last 72 hours   Microbiology: Recent Results (from the past 720 hour(s))  SURGICAL PCR SCREEN     Status: Normal   Collection Time   01/20/12 10:20 AM      Component Value Range Status Comment   MRSA, PCR NEGATIVE  NEGATIVE Final    Staphylococcus aureus NEGATIVE  NEGATIVE Final     Medical History: Past Medical History  Diagnosis Date  . Osteoporosis   . Carpal tunnel syndrome on both sides   . Cataract mature, total senile     "both" (01/25/2012)  . PONV (postoperative nausea and vomiting)   . Anxiety   . GERD (gastroesophageal reflux disease)   . Peripheral neuropathy     "not diabetic" (01/25/2012)  . Pneumonia  1978    "when my son was born" (01/25/2012)  . Breast cancer     "left" (01/25/2012)  . 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 History of stomach ulcers ` 1990  . Left knee DJD   . Arthritis     "all over my body" (01/25/2012)  . DJD (degenerative joint disease)     "neck, shoulders, back" (01/25/2012)  . Osteoporosis   . Osteopenia   . Chronic lower back pain     Medications:  Scheduled:    . [COMPLETED] acetaminophen  1,000 mg Intravenous Once  . acetaminophen  1,000 mg Intravenous Q6H  . dexamethasone  10 mg Oral Q8H   Or  . dexamethasone  10 mg Intravenous Q8H  . diazepam      . [COMPLETED] diazepam  5 mg Intravenous Once  . docusate sodium  100 mg Oral BID  . enoxaparin (LOVENOX) injection  30 mg Subcutaneous Q12H  . gabapentin  300 mg Oral TID  . HYDROmorphone      . HYDROmorphone      . HYDROmorphone      . oxyCODONE      . pantoprazole  40 mg Oral Daily  . vancomycin  750 mg Intravenous Q12H  . vancomycin  750 mg Intravenous Q12H  . [COMPLETED] vancomycin  1,000 mg Intravenous 60 min Pre-Op  . [DISCONTINUED] chlorhexidine  60 mL Topical Once  . [DISCONTINUED] chlorhexidine  60 mL Topical Once  . [DISCONTINUED] fentaNYL  50-100 mcg Intravenous Once  . [DISCONTINUED] povidone-iodine   Topical Once  . [DISCONTINUED] scopolamine  1 patch Transdermal Once   Assessment:56 yr old female admitted for total knee replacement. The received 1 dose (1 Gm) in the OR.   Goal of Therapy:  Vancomycin trough level 10-15 mcg/ml  Plan:  Vancomycin 750 mg IV Q12 hrs. Levels when appropriate.  Eugene Garnet 01/25/2012,5:43 PM

## 2012-01-25 NOTE — Anesthesia Preprocedure Evaluation (Signed)
Anesthesia Evaluation  Patient identified by MRN, date of birth, ID band Patient awake    Reviewed: Allergy & Precautions, H&P , NPO status , Patient's Chart, lab work & pertinent test results  History of Anesthesia Complications (+) PONV  Airway Mallampati: II TM Distance: >3 FB Neck ROM: Full    Dental   Pulmonary  breath sounds clear to auscultation        Cardiovascular Rhythm:Regular Rate:Normal     Neuro/Psych Anxiety  Neuromuscular disease    GI/Hepatic GERD-  ,  Endo/Other    Renal/GU      Musculoskeletal   Abdominal (+) + obese,   Peds  Hematology   Anesthesia Other Findings   Reproductive/Obstetrics Breast ca hx                           Anesthesia Physical Anesthesia Plan  ASA: II  Anesthesia Plan: General   Post-op Pain Management:    Induction: Intravenous  Airway Management Planned: Oral ETT  Additional Equipment:   Intra-op Plan:   Post-operative Plan: Extubation in OR  Informed Consent:   Plan Discussed with: CRNA and Surgeon  Anesthesia Plan Comments:         Anesthesia Quick Evaluation

## 2012-01-26 ENCOUNTER — Encounter (HOSPITAL_COMMUNITY): Payer: Self-pay | Admitting: Orthopedic Surgery

## 2012-01-26 LAB — BASIC METABOLIC PANEL
BUN: 7 mg/dL (ref 6–23)
CO2: 23 mEq/L (ref 19–32)
Calcium: 9.6 mg/dL (ref 8.4–10.5)
Chloride: 100 mEq/L (ref 96–112)
Creatinine, Ser: 0.41 mg/dL — ABNORMAL LOW (ref 0.50–1.10)
Glucose, Bld: 216 mg/dL — ABNORMAL HIGH (ref 70–99)

## 2012-01-26 LAB — CBC
HCT: 38 % (ref 36.0–46.0)
Hemoglobin: 12.6 g/dL (ref 12.0–15.0)
MCH: 29.4 pg (ref 26.0–34.0)
MCV: 88.8 fL (ref 78.0–100.0)
RBC: 4.28 MIL/uL (ref 3.87–5.11)
WBC: 19.1 10*3/uL — ABNORMAL HIGH (ref 4.0–10.5)

## 2012-01-26 MED ORDER — ENOXAPARIN SODIUM 30 MG/0.3ML ~~LOC~~ SOLN: 30.0000 mg | Freq: Two times a day (BID) | SUBCUTANEOUS | Status: DC

## 2012-01-26 MED ORDER — DSS 100 MG PO CAPS: ORAL_CAPSULE | ORAL | Status: DC

## 2012-01-26 MED ORDER — OXYCODONE HCL 5 MG PO TABS: 5.0000 mg | ORAL_TABLET | ORAL | Status: DC | PRN

## 2012-01-26 NOTE — Progress Notes (Signed)
Utilization review completed. Cledith Abdou, RN, BSN. 

## 2012-01-26 NOTE — Discharge Summary (Signed)
Physician Discharge Summary  Patient ID: Claudia Murphy MRN: [id number] DOB/AGE: 1955/10/07 56 y.o.  Admit date: 01/25/2012 Discharge date: 01/26/2012  Admission Diagnoses:  Discharge Diagnoses:  Active Problems:  Left knee DJD   Discharged Condition: stable  Hospital Course:Patient underwent Left knee unicompartment replacement on date of admission. Post operative course including physical therapy, pain control, and antibiotics and anticoagulation was uneventful. Her  PT goal for safe ambulation were met and she was medically stable at time of discharge.  Consults: None  Significant Diagnostic Studies:  Treatments: surgery: left unicompartment knee replacement  Discharge Exam: Blood pressure 134/70, pulse 103, temperature 98.2 F (36.8 C), temperature source Oral, resp. rate 16, height 5' 1.81" (1.57 m), weight 76.3 kg (168 lb 3.4 oz), SpO2 100.00%. Incision/Wound:wound clean and dry  Disposition:   Discharge Orders    Future Appointments: Provider: Department: Dept Phone: Center:   08/26/2012 9:00 AM Randall An, MD [facility] [phone] None     Future Orders Please Complete By Expires   Diet - low sodium heart healthy      Call MD / Call 911      Comments:   If you experience chest pain or shortness of breath, CALL 911 and be transported to the hospital emergency room.  If you develope a fever above 101 F, pus (white drainage) or increased drainage or redness at the wound, or calf pain, call your surgeon's office.   Discharge instructions      Comments:   Partial Knee Replacement Care After Refer to this sheet in the next few weeks. These discharge instructions provide you with general information on caring for yourself after you leave the hospital. Your caregiver may also give you specific instructions. Your treatment has been planned according to the most current medical practices available, but unavoidable complications sometimes occur. If you  have any problems or questions after discharge, please call your caregiver. Regaining a near full range of motion of your knee within the first 3 to 6 weeks after surgery is critical. HOME CARE INSTRUCTIONS  You may resume a normal diet and activities as directed.  Perform exercises as directed.  Place yellow foam block, yellow side up under heel at all times except when in CPM or when walking.  DO NOT modify, tear, cut, or change in any way. You will receive physical therapy daily  Take showers instead of baths until informed otherwise.  Change bandages (dressings)daily Do not take over-the-counter or prescription medicines for pain, discomfort, or fever. Eat a well-balanced diet.  Avoid lifting or driving until you are instructed otherwise.  Make an appointment to see your caregiver for stitches (suture) or staple removal as directed.  If you have been sent home with a continuous passive motion machine (CPM machine), 0-90 degrees 6 hrs a day   2 hrs a shift SEEK MEDICAL CARE IF: You have swelling of your calf or leg.  You develop shortness of breath or chest pain.  You have redness, swelling, or increasing pain in the wound.  There is pus or any unusual drainage coming from the surgical site.  You notice a bad smell coming from the surgical site or dressing.  The surgical site breaks open after sutures or staples have been removed.  There is persistent bleeding from the suture or staple line.  You are getting worse or are not improving.  You have any other questions or concerns.  SEEK IMMEDIATE MEDICAL CARE IF:  You have a fever.  You develop a rash.  You have difficulty breathing.  You develop any reaction or side effects to medicines given.  Your knee motion is decreasing rather than improving.  MAKE SURE YOU:  Understand these instructions.  Will watch your condition.  Will get help right away if you are not doing well or get worse.   Constipation Prevention      Comments:    Drink plenty of fluids.  Prune juice may be helpful.  You may use a stool softener, such as Colace (over the counter) 100 mg twice a day.  Use MiraLax (over the counter) for constipation as needed.   Increase activity slowly as tolerated      CPM      Comments:   CPM 0-60 for 6 hrs a day.  Can do in 2 hour increments.  Increase by 10 degrees a day until 0-90.   TED hose      Comments:   Use stockings (TED hose) for 2 weeks on both leg(s).  You may remove them at night for sleeping.   Change dressing      Comments:   Change the dressing daily with sterile 4 x 4 inch gauze dressing and apply TED hose.  You may clean the incision with alcohol prior to redressing.   Do not put a pillow under the knee. Place it under the heel.      Comments:   Place yellow block under heel at all times except when up walking or in CPM.  You must sleep in it at night   Driving restrictions      Comments:   No driving until cleared by dr Thurston Hole   Lifting restrictions      Comments:   No lifting for 6 weeks       Medication List     As of 01/26/2012  2:49 PM    STOP taking these medications         aspirin 81 MG tablet      HYDROcodone-acetaminophen 7.5-325 MG per tablet   Commonly known as: NORCO      meloxicam 15 MG tablet   Commonly known as: MOBIC      TAKE these medications         ALPRAZolam 0.5 MG tablet   Commonly known as: XANAX   Take 0.5 mg by mouth 3 (three) times daily as needed. For anxiety      BONIVA 150 MG tablet   Generic drug: ibandronate   Take 150 mg by mouth every 30 (thirty) days. On the 29th of each month; Take in the morning with a full glass of water, on an empty stomach, and do not take anything else by mouth or lie down for the next 30 min.      calcium-vitamin D 500-200 MG-UNIT per tablet   Commonly known as: OSCAL WITH D   Take 1 tablet by mouth 2 (two) times daily.      DEXILANT 60 MG capsule   Generic drug: dexlansoprazole   Take 60 mg by mouth daily as  needed. For acid reflux      DSS 100 MG Caps   1 tab 2 times a day while on narcotics.  STOOL SOFTENER      enoxaparin 30 MG/0.3ML injection   Commonly known as: LOVENOX   Inject 0.3 mLs (30 mg total) into the skin every 12 (twelve) hours.      gabapentin 300 MG capsule   Commonly known as: NEURONTIN  Take 300 mg by mouth 3 (three) times daily.      oxyCODONE 5 MG immediate release tablet   Commonly known as: Oxy IR/ROXICODONE   Take 1-2 tablets (5-10 mg total) by mouth every 3 (three) hours as needed.           Follow-up Information    Schedule an appointment as soon as possible for a visit with Nilda Simmer, MD.   Contact information:   [address] [place] [address] [phone]          Signed: Clarene Critchley 01/26/2012, 2:49 PM

## 2012-01-26 NOTE — Progress Notes (Signed)
CARE MANAGEMENT NOTE 01/26/2012  Patient:  JAKEIA, CARRERAS   Account Number:  [id number]  Date Initiated:  01/26/2012  Documentation initiated by:  Vance Peper  Subjective/Objective Assessment:   56 yr old female s/p left total knee arthroplasty.     Action/Plan:   CM spoke with patient and husband concerning home health and DME needs at discharge. Choice offered. Patient preoperatively setup with [facility],no changes.Rolling walker, 3in1,and CPM have been delivered to patient's home.   Anticipated DC Date:  01/26/2012   Anticipated DC Plan:  HOME W HOME HEALTH SERVICES      DC Planning Services  CM consult      [facility] Choice  HOME HEALTH   Choice offered to / List presented to:  C-1 Patient        HH arranged  HH-2 PT      [facility] agency  [facility]   Status of service:  Completed, signed off Medicare Important Message given?   (If response is "NO", the following Medicare IM given date fields will be blank) Date Medicare IM given:   Date Additional Medicare IM given:    Discharge Disposition:  HOME W HOME HEALTH SERVICES  Per UR Regulation:    If discussed at Long Length of Stay Meetings, dates discussed:    Comments:

## 2012-01-26 NOTE — Progress Notes (Signed)
Subjective: 1 Day Post-Op Procedure(s) (LRB): UNICOMPARTMENTAL KNEE (Left) Patient reports pain as 4 on 0-10 scale.    Objective: Vital signs in last 24 hours: Temp:  [97.4 F (36.3 C)-98.4 F (36.9 C)] 98.2 F (36.8 C) (12/10 1300) Pulse Rate:  [72-103] 103  (12/10 1300) Resp:  [16] 16  (12/10 1300) BP: (113-138)/(58-75) 134/70 mmHg (12/10 1300) SpO2:  [96 %-100 %] 100 % (12/10 1300) Weight:  [76.3 kg (168 lb 3.4 oz)] 76.3 kg (168 lb 3.4 oz) (12/09 1700)  Intake/Output from previous day: 12/09 0701 - 12/10 0700 In: 3458.3 [P.O.:120; I.V.:2888.3; IV Piggyback:450] Out: 3650 [Urine:3500; Drains:150] Intake/Output this shift: Total I/O In: 240 [P.O.:240] Out: 200 [Urine:200]   Basename 01/26/12 1000  HGB 12.6    Basename 01/26/12 1000  WBC 19.1*  RBC 4.28  HCT 38.0  PLT 299    Basename 01/26/12 1000  NA 138  K 3.9  CL 100  CO2 23  BUN 7  CREATININE 0.41*  GLUCOSE 216*  CALCIUM 9.6   No results found for this basename: LABPT:2,INR:2 in the last 72 hours  Neurologically intact Neurovascular intact Sensation intact distally Incision: dressing C/D/I Compartment soft  Assessment/Plan: 1 Day Post-Op Procedure(s) (LRB): UNICOMPARTMENTAL KNEE (Left) Up with therapy D/C IV fluids Discharge home with home health  ROBERTS,JANE B 01/26/2012, 2:34 PM

## 2012-01-26 NOTE — Progress Notes (Signed)
Physical Therapy Treatment Patient Details Name: Claudia Murphy MRN: [id number] DOB: 07/20/1955 Today's Date: 01/26/2012 Time: 1330-1401 PT Time Calculation (min): 31 min  PT Assessment / Plan / Recommendation Comments on Treatment Session  Patient tolerating reatment well. Focus of this session was stair training prior to DC home.     Follow Up Recommendations  Home health PT;Supervision/Assistance - 24 hour     Does the patient have the potential to tolerate intense rehabilitation     Barriers to Discharge        Equipment Recommendations  None recommended by PT    Recommendations for Other Services    Frequency 7X/week   Plan Discharge plan remains appropriate;Frequency remains appropriate    Precautions / Restrictions Precautions Precautions: Knee Required Braces or Orthoses: Knee Immobilizer - Left Knee Immobilizer - Left: On except when in CPM;Discontinue post op day 2 Restrictions LLE Weight Bearing: Weight bearing as tolerated   Pertinent Vitals/Pain     Mobility  Bed Mobility Bed Mobility: Not assessed Sit to Supine: 4: Min assist;HOB flat Details for Bed Mobility Assistance: A for LLE Transfers Sit to Stand: 5: Supervision;From chair/3-in-1;With upper extremity assist Stand to Sit: To chair/3-in-1;5: Supervision;With upper extremity assist Details for Transfer Assistance: Supervision for safety Ambulation/Gait Ambulation/Gait Assistance: 5: Supervision Ambulation Distance (Feet): 120 Feet Assistive device: Rolling walker Ambulation/Gait Assistance Details: Patient good with sequency, and safe management of RW Gait Pattern: Step-to pattern;Decreased step length - right;Decreased step length - left Gait velocity: decreased General Gait Details: decreased Stairs: Yes Stairs Assistance: 4: Min guard Stair Management Technique: Step to pattern;Sideways;One rail Left Number of Stairs: 4     Exercises Total Joint Exercises Quad Sets: AROM;Both;10  reps Heel Slides: AAROM;Left;10 reps Hip ABduction/ADduction: AAROM;Left;10 reps   PT Diagnosis:    PT Problem List:   PT Treatment Interventions:     PT Goals Acute Rehab PT Goals PT Goal: Sit to Supine/Side - Progress: Progressing toward goal PT Goal: Sit to Stand - Progress: Progressing toward goal PT Goal: Ambulate - Progress: Progressing toward goal PT Goal: Up/Down Stairs - Progress: Progressing toward goal PT Goal: Perform Home Exercise Program - Progress: Progressing toward goal  Visit Information  Last PT Received On: 01/26/12 Assistance Needed: +1    Subjective Data      Cognition  Overall Cognitive Status: Appears within functional limits for tasks assessed/performed Arousal/Alertness: Awake/alert Orientation Level: Appears intact for tasks assessed Behavior During Session: [facility] for tasks performed    Balance     End of Session PT - End of Session Equipment Utilized During Treatment: Gait belt;Left knee immobilizer Activity Tolerance: Patient tolerated treatment well Patient left: in chair;with call bell/phone within reach Nurse Communication: Mobility status   GP     Fredrich Birks 01/26/2012, 2:09 PM 01/26/2012 Fredrich Birks PTA [phone] pager [phone] office

## 2012-01-26 NOTE — Progress Notes (Signed)
Physical Therapy Treatment Patient Details Name: DANIYLA PFAHLER MRN: [id number] DOB: 1955/10/08 Today's Date: 01/26/2012 Time: 4098-1191 PT Time Calculation (min): 39 min  PT Assessment / Plan / Recommendation Comments on Treatment Session  Patient progressing with mobility however very anxious about increased pain. Patient able to stand from recliner and 3n1 without physical assistance and husband present throughout for education as he will be primary caregiver. Patient will need to complete stair traning today prior to discharge home.     Follow Up Recommendations  Home health PT;Supervision/Assistance - 24 hour     Does the patient have the potential to tolerate intense rehabilitation     Barriers to Discharge        Equipment Recommendations  None recommended by PT    Recommendations for Other Services    Frequency 7X/week   Plan Discharge plan remains appropriate;Frequency remains appropriate    Precautions / Restrictions Precautions Precautions: Knee Precaution Booklet Issued: Yes (comment) Precaution Comments: Reviewed precautions with patient and husband Required Braces or Orthoses: Knee Immobilizer - Left Knee Immobilizer - Left: On except when in CPM;Discontinue post op day 2 Restrictions LLE Weight Bearing: Weight bearing as tolerated   Pertinent Vitals/Pain     Mobility  Bed Mobility Bed Mobility: Not assessed Transfers Sit to Stand: 4: Min guard;With upper extremity assist;From chair/3-in-1 Stand to Sit: 4: Min guard;With upper extremity assist;To chair/3-in-1 Details for Transfer Assistance: Cues for technique Ambulation/Gait Ambulation/Gait Assistance: 4: Min guard Ambulation Distance (Feet): 80 Feet Assistive device: Rolling walker Ambulation/Gait Assistance Details: Cues for gait sequence, management of RW and posture Gait Pattern: Step-to pattern;Antalgic General Gait Details: decreased    Exercises Total Joint Exercises Quad Sets: AROM;Both;10  reps Heel Slides: AAROM;Left;10 reps Hip ABduction/ADduction: AAROM;Left;10 reps   PT Diagnosis:    PT Problem List:   PT Treatment Interventions:     PT Goals Acute Rehab PT Goals PT Goal: Sit to Stand - Progress: Progressing toward goal PT Goal: Ambulate - Progress: Progressing toward goal PT Goal: Perform Home Exercise Program - Progress: Progressing toward goal  Visit Information  Last PT Received On: 01/26/12 Assistance Needed: +1    Subjective Data      Cognition  Overall Cognitive Status: Appears within functional limits for tasks assessed/performed Arousal/Alertness: Awake/alert Orientation Level: Appears intact for tasks assessed Behavior During Session: [facility] for tasks performed    Balance     End of Session PT - End of Session Equipment Utilized During Treatment: Gait belt Activity Tolerance: Patient tolerated treatment well Patient left: in chair;with call bell/phone within reach Nurse Communication: Mobility status   GP     Fredrich Birks 01/26/2012, 10:27 AM  01/26/2012 Fredrich Birks PTA [phone] pager [phone] office

## 2012-01-26 NOTE — Evaluation (Signed)
Occupational Therapy Evaluation Patient Details Name: Claudia Murphy MRN: [id number] DOB: 06/25/1955 Today's Date: 01/26/2012 Time: 6295-2841 [phone] first attempt) OT Time Calculation (min): 23 min  OT Assessment / Plan / Recommendation Clinical Impression  56 yo female admitted for LT TKA wearing KI that coudl benefit from skilled OT acutely. OT to follow acutely.     OT Assessment  Patient needs continued OT Services    Follow Up Recommendations  Home health OT    Barriers to Discharge      Equipment Recommendations  None recommended by OT    Recommendations for Other Services    Frequency  Min 2X/week    Precautions / Restrictions Precautions Precautions: Knee Precaution Booklet Issued: Yes (comment) Precaution Comments: Reviewed precautions with patient and husband Required Braces or Orthoses: Knee Immobilizer - Left Knee Immobilizer - Left: On except when in CPM;Discontinue post op day 2 Restrictions LLE Weight Bearing: Weight bearing as tolerated   Pertinent Vitals/Pain Pain much better s/p medication from RN Pain less than 5 out 10    ADL  Eating/Feeding: Set up Where Assessed - Eating/Feeding: Chair Grooming: Wash/dry face;Wash/dry hands;Supervision/safety Where Assessed - Grooming: Unsupported standing Lower Body Dressing: Set up Where Assessed - Lower Body Dressing: Unsupported sit to stand Toilet Transfer: Supervision/safety Toilet Transfer Method: Sit to Barista: Raised toilet seat with arms (or 3-in-1 over toilet) Toileting - Clothing Manipulation and Hygiene: Min guard Where Assessed - Toileting Clothing Manipulation and Hygiene: Sit to stand from 3-in-1 or toilet Tub/Shower Transfer: Minimal assistance Tub/Shower Transfer Method: Stand pivot Tub/Shower Transfer Equipment: Walk in shower Equipment Used: Rolling walker;Gait belt;Knee Immobilizer Transfers/Ambulation Related to ADLs: pt ambulated min guard (A) with min (A) for  sequence. Pt with increased safety with RW by talking aloud the sequence used with RW ADL Comments: pt is able to touch mid foot so pt can don underwear but will need (A) from husband for socks. Pt completed toilet transfer, grooming at sink and educated on shower transfer. Pt and husband with no questions regarding shower transfer. Pt has all handouts needed and education for transfers completed.    OT Diagnosis: Generalized weakness;Acute pain  OT Problem List: Decreased strength;Impaired balance (sitting and/or standing);Decreased activity tolerance;Pain;Decreased knowledge of precautions;Decreased knowledge of use of DME or AE OT Treatment Interventions: Self-care/ADL training;DME and/or AE instruction;Patient/family education   OT Goals Acute Rehab OT Goals OT Goal Formulation: With patient/family Time For Goal Achievement: 02/09/12 Potential to Achieve Goals: Good ADL Goals Pt Will Perform Lower Body Dressing: with supervision;Sit to stand from chair ADL Goal: Lower Body Dressing - Progress: Goal set today Pt Will Perform Tub/Shower Transfer: with set-up;Ambulation;with DME ADL Goal: Tub/Shower Transfer - Progress: Goal set today  Visit Information  Last OT Received On: 01/26/12 Assistance Needed: +1    Subjective Data  Subjective: "oh that hurt this morning"- pt c/o pain during PA dressing change of TKA Patient Stated Goal: to go home today   Prior Functioning     Home Living Lives With: Spouse Available Help at Discharge: Family;Available 24 hours/day Type of Home: House Home Access: Stairs to enter [employer] of Steps: 3 Entrance Stairs-Rails: Right;Left Home Layout: One level Bathroom Shower/Tub: Walk-in shower;Tub/shower unit Bathroom Toilet: Standard Home Adaptive Equipment: Walker - rolling;Bedside commode/3-in-1;Shower chair with back Additional Comments: handout provided for shower transfer and reviewed. practiced teach back method by having husband  verbalize to patient Prior Function Level of Independence: Independent Able to Take Stairs?: Yes Driving: Yes Vocation:  Full time employment Communication Communication: No difficulties Dominant Hand: Right         Vision/Perception     Cognition  Overall Cognitive Status: Appears within functional limits for tasks assessed/performed Arousal/Alertness: Awake/alert Orientation Level: Oriented X4 / Intact Behavior During Session: [facility] for tasks performed    Extremity/Trunk Assessment Right Upper Extremity Assessment RUE ROM/Strength/Tone: [facility] for tasks assessed Left Upper Extremity Assessment LUE ROM/Strength/Tone: WFL for tasks assessed Trunk Assessment Trunk Assessment: Normal     Mobility Bed Mobility Bed Mobility: Not assessed (PA assisted patient out of bed) Transfers Transfers: Sit to Stand;Stand to Sit Sit to Stand: 4: Min guard;From chair/3-in-1 Stand to Sit: 4: Min guard;To chair/3-in-1 Details for Transfer Assistance: good hand placement     Shoulder Instructions     Exercise     Balance     End of Session OT - End of Session Activity Tolerance: Patient tolerated treatment well Patient left: in chair;with call bell/phone within reach;with family/visitor present Nurse Communication: Mobility status  GO     Harrel Car[employer] 01/26/2012, 9:56 AM Pager: [phone]

## 2012-02-18 ENCOUNTER — Encounter (INDEPENDENT_AMBULATORY_CARE_PROVIDER_SITE_OTHER): Payer: Managed Care, Other (non HMO)

## 2012-02-18 ENCOUNTER — Other Ambulatory Visit: Payer: Self-pay | Admitting: Cardiology

## 2012-02-18 DIAGNOSIS — M79609 Pain in unspecified limb: Secondary | ICD-10-CM

## 2012-04-14 ENCOUNTER — Other Ambulatory Visit: Payer: Self-pay | Admitting: Orthopedic Surgery

## 2012-04-14 DIAGNOSIS — M5030 Other cervical disc degeneration, unspecified cervical region: Secondary | ICD-10-CM

## 2012-04-29 ENCOUNTER — Ambulatory Visit
Admission: RE | Admit: 2012-04-29 | Discharge: 2012-04-29 | Disposition: A | Discharge status: Home / Self Care | Payer: Managed Care, Other (non HMO) | Source: Ambulatory Visit | Attending: Orthopedic Surgery | Admitting: Orthopedic Surgery

## 2012-04-29 DIAGNOSIS — M5030 Other cervical disc degeneration, unspecified cervical region: Secondary | ICD-10-CM

## 2012-04-29 DIAGNOSIS — M545 Low back pain: Secondary | ICD-10-CM

## 2012-08-05 ENCOUNTER — Other Ambulatory Visit (HOSPITAL_COMMUNITY): Payer: Self-pay | Admitting: Oncology

## 2012-08-05 ENCOUNTER — Other Ambulatory Visit: Payer: Self-pay | Admitting: Obstetrics and Gynecology

## 2012-08-05 DIAGNOSIS — N644 Mastodynia: Secondary | ICD-10-CM

## 2012-08-05 DIAGNOSIS — Z139 Encounter for screening, unspecified: Secondary | ICD-10-CM

## 2012-08-12 ENCOUNTER — Ambulatory Visit (HOSPITAL_COMMUNITY)
Admission: RE | Admit: 2012-08-12 | Discharge: 2012-08-12 | Disposition: A | Discharge status: Home / Self Care | Payer: Managed Care, Other (non HMO) | Source: Ambulatory Visit | Attending: Oncology | Admitting: Oncology

## 2012-08-12 DIAGNOSIS — Z139 Encounter for screening, unspecified: Secondary | ICD-10-CM

## 2012-08-24 ENCOUNTER — Ambulatory Visit (HOSPITAL_COMMUNITY)
Admission: RE | Admit: 2012-08-24 | Discharge: 2012-08-24 | Disposition: A | Discharge status: Home / Self Care | Payer: Managed Care, Other (non HMO) | Source: Ambulatory Visit | Attending: Oncology | Admitting: Oncology

## 2012-08-24 ENCOUNTER — Other Ambulatory Visit (HOSPITAL_COMMUNITY): Payer: Self-pay | Admitting: Oncology

## 2012-08-24 DIAGNOSIS — Z853 Personal history of malignant neoplasm of breast: Secondary | ICD-10-CM | POA: Insufficient documentation

## 2012-08-24 DIAGNOSIS — N644 Mastodynia: Secondary | ICD-10-CM

## 2012-08-26 ENCOUNTER — Encounter (HOSPITAL_COMMUNITY): Payer: Managed Care, Other (non HMO) | Attending: Physician Assistant

## 2012-08-26 ENCOUNTER — Encounter (HOSPITAL_COMMUNITY): Payer: Self-pay

## 2012-08-26 VITALS — BP 128/82 | HR 97 | Temp 97.0°F | Resp 18 | Wt 170.8 lb

## 2012-08-26 DIAGNOSIS — C50912 Malignant neoplasm of unspecified site of left female breast: Secondary | ICD-10-CM

## 2012-08-26 DIAGNOSIS — C50919 Malignant neoplasm of unspecified site of unspecified female breast: Secondary | ICD-10-CM

## 2012-08-26 NOTE — Progress Notes (Signed)
CC:   Madelin Rear. Sherwood Gambler, MD Kathrin Penner. Vear Clock, M.D. Dalia Heading, M.D.  DIAGNOSES: 1. 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  But with the 6th cycle, methotrexate was     substituted for the epirubicin due to skin toxicity.  Her     mastectomy was on 03/04/2005 and she finished all chemotherapy as     of 06/03/2005.  Thus far, she has no evidence of recurrence. 2. 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 Hadassah Pais that she needs a left knee replacement at     some point in the near future. 3. Left plantar fasciitis. Status post surgical repair by Dr. Charlsie Merles.  4. 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  She was seen in consultation by Dr. Jerilee Field in the past, but no further therapy was recommended. 5. 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 6. Osteoporosis. 7. History of colonoscopy more than 5 years ago.   Shelle presents for scheduled yearly followup. She had a screening mammogram of the right breast on 08/24/2012 and a followup ultrasound of the right breast due to some tenderness in the area. She still has chronic pain in the right breast,especially the upper outer quadrant, that is not new or different.  Both of these studies were negative for any evidence of malignancy. Recommendations for a repeat screening unilateral mammography in one year. She continues to do her monthly self breast exams and has not  noted any symptoms or signs of concern in the right breast or along the left chest wall.  She continues to work full-time, third shift, and her biggest issue is still continued pain affecting her left foot. She plans to schedule followup appointment with Dr. Charlsie Merles for further evaluation and management. She continues to have chronic back pain and is currently seeing Dr. Vear Clock but tells me today that she plans to see Dr. Channing Mutters in [place] and this appointment is scheduled for 09/21/2012. She has no nausea, no vomiting.  No GI or GU symptomatology.  She denies shortness of breath. She quit smoking November 16 2004. She is not currently smoking.   PHYSICAL EXAMINATION:  Vital signs:  She remains, of course, overweight for her height, she is 170.8nds and her blood pressure is 128/82, pulse 97 and regular,temperature 90.7, respirations 18 and unlabored.  General:  She is in no acute distress.  No masses or adenopathy palpated in the left axilla. The left chest wall reveals a well-healed surgical incision without any masses, induration or focal tenderness. There is no adenopathy in cervical, supraclavicular, infraclavicular,axillary or inguinal areas.  The right breast is mildly in the upperouter quadrant, but without masses, but there is thickened tissue there.Lungs:  Clear  to auscultation and percussion.  Heart:  Shows a regularrhythm and rate without murmur or gallop.  Abdomen:  Soft and nontenderwithout organomegaly or masses.  She has no peripheral edema of the arms or legs.  LABS:  There are no laboratory studies from today's visit.  Impression and plan is a very pleasant 57 year old Caucasian female who is now 7 years out from her history of Stage I (T1c N0 M0) intermediate grade infiltrating ductalcarcinoma of the left breast that was triple negative, 14 nodeswere negative, Ki-67 marker at 12%. Status post FEC x6 cycles in a dose dense fashion.  But with the 6th cycle, methotrexate wassubstituted for the  epirubicin due to skin toxicity.  Her mastectomy was on 03/04/2005 and she finished all chemotherapy as of 06/03/2005.  We will plan to continue yearly followup. She'll return in one year with repeat screening mammogram of the right breast, a CBC differential and C. met, as well as a symptom management visit.  Patient be reviewed with Dr. Orlie Dakin.  Tiana Loft, PA-C

## 2012-08-26 NOTE — Patient Instructions (Addendum)
.  [facility] Discharge Instructions  RECOMMENDATIONS MADE BY THE CONSULTANT AND ANY TEST RESULTS WILL BE SENT TO YOUR REFERRING PHYSICIAN.  EXAM FINDINGS BY THE PHYSICIAN TODAY AND SIGNS OR SYMPTOMS TO REPORT TO CLINIC OR PRIMARY PHYSICIAN:  Exam good today     INSTRUCTIONS GIVEN AND DISCUSSED: Labs  in 1 year Mammogram in 1 year  SPECIAL INSTRUCTIONS/FOLLOW-UP: 1 year   Thank you for choosing [facility] to provide your oncology and hematology care.  To afford each patient quality time with our providers, please arrive at least 15 minutes before your scheduled appointment time.  With your help, our goal is to use those 15 minutes to complete the necessary work-up to ensure our physicians have the information they need to help with your evaluation and healthcare recommendations.    Effective January 1st, 2014, we ask that you re-schedule your appointment with our physicians should you arrive 10 or more minutes late for your appointment.  We strive to give you quality time with our providers, and arriving late affects you and other patients whose appointments are after yours.    Again, thank you for choosing [facility].  Our hope is that these requests will decrease the amount of time that you wait before being seen by our physicians.       _____________________________________________________________  Should you have questions after your visit to [facility], please contact our office at [phone] between the hours of 8:30 a.m. and 5:00 p.m.  Voicemails left after 4:30 p.m. will not be returned until the following business day.  For prescription refill requests, have your pharmacy contact our office with your prescription refill request.

## 2012-09-28 ENCOUNTER — Ambulatory Visit (INDEPENDENT_AMBULATORY_CARE_PROVIDER_SITE_OTHER): Payer: Managed Care, Other (non HMO) | Admitting: Adult Health

## 2012-09-28 ENCOUNTER — Encounter: Payer: Self-pay | Admitting: Adult Health

## 2012-09-28 ENCOUNTER — Other Ambulatory Visit (HOSPITAL_COMMUNITY)
Admission: RE | Admit: 2012-09-28 | Discharge: 2012-09-28 | Disposition: A | Discharge status: Home / Self Care | Payer: Managed Care, Other (non HMO) | Source: Ambulatory Visit | Attending: Adult Health | Admitting: Adult Health

## 2012-09-28 VITALS — BP 120/80 | HR 72 | Ht 61.0 in | Wt 168.0 lb

## 2012-09-28 DIAGNOSIS — Z01419 Encounter for gynecological examination (general) (routine) without abnormal findings: Secondary | ICD-10-CM | POA: Insufficient documentation

## 2012-09-28 DIAGNOSIS — R519 Headache, unspecified: Secondary | ICD-10-CM

## 2012-09-28 DIAGNOSIS — Z1151 Encounter for screening for human papillomavirus (HPV): Secondary | ICD-10-CM | POA: Insufficient documentation

## 2012-09-28 DIAGNOSIS — Z1212 Encounter for screening for malignant neoplasm of rectum: Secondary | ICD-10-CM

## 2012-09-28 DIAGNOSIS — C50912 Malignant neoplasm of unspecified site of left female breast: Secondary | ICD-10-CM

## 2012-09-28 LAB — CBC
MCH: 29.7 pg (ref 26.0–34.0)
MCHC: 33.9 g/dL (ref 30.0–36.0)
Platelets: 322 10*3/uL (ref 150–400)
RDW: 13.2 % (ref 11.5–15.5)

## 2012-09-28 LAB — COMPREHENSIVE METABOLIC PANEL
ALT: 14 U/L (ref 0–35)
Alkaline Phosphatase: 79 U/L (ref 39–117)
Sodium: 142 mEq/L (ref 135–145)
Total Bilirubin: 0.7 mg/dL (ref 0.3–1.2)
Total Protein: 7.4 g/dL (ref 6.0–8.3)

## 2012-09-28 LAB — LIPID PANEL
Total CHOL/HDL Ratio: 2.4 Ratio
VLDL: 16 mg/dL (ref 0–40)

## 2012-09-28 LAB — HEMOCCULT GUIAC POC 1CARD (OFFICE): Fecal Occult Blood, POC: NEGATIVE

## 2012-09-28 MED ORDER — IBANDRONATE SODIUM 150 MG PO TABS: 150.0000 mg | ORAL_TABLET | ORAL | Status: DC

## 2012-09-28 MED ORDER — PROMETHAZINE HCL 25 MG PO TABS: 25.0000 mg | ORAL_TABLET | Freq: Four times a day (QID) | ORAL | Status: DC | PRN

## 2012-09-28 NOTE — Patient Instructions (Addendum)
Physical in 1 year Mammogram yearly  Colonoscopy as per GI Headache Headaches are caused by many different problems. Most commonly, headache is caused by muscle tension from an injury, fatigue, or emotional upset. Excessive muscle contractions in the scalp and neck result in a headache that often feels like a tight band around the head. Tension headaches often have areas of tenderness over the scalp and the back of the neck. These headaches may last for hours, days, or longer, and some may contribute to migraines in those who have migraine problems. Migraines usually cause a throbbing headache, which is made worse by activity. Sometimes only one side of the head hurts. Nausea, vomiting, eye pain, and avoidance of food are common with migraines. Visual symptoms such as light sensitivity, blind spots, or flashing lights may also occur. Loud noises may worsen migraine headaches. Many factors may cause migraine headaches:  Emotional stress, lack of sleep, and menstrual periods.   Alcohol and some drugs (such as birth control pills).   Diet factors (fasting, caffeine, food preservatives, chocolate).   Environmental factors (weather changes, bright lights, odors, smoke).  Other causes of headaches include minor injuries to the head. Arthritis in the neck; problems with the jaw, eyes, ears, or nose are also causes of headaches. Allergies, drugs, alcohol, and exposure to smoke can also cause moderate headaches. Rebound headaches can occur if someone uses pain medications for a long period of time and then stops. Less commonly, blood vessel problems in the neck and brain (including stroke) can cause various types of headache. Treatment of headaches includes medicines for pain and relaxation. Ice packs or heat applied to the back of the head and neck help some people. Massaging the shoulders, neck and scalp are often very useful. Relaxation techniques and stretching can help prevent these headaches. Avoid  alcohol and cigarette smoking as these tend to make headaches worse. Please see your caregiver if your headache is not better in 2 days.  SEEK IMMEDIATE MEDICAL CARE IF:   You develop a high fever, chills, or repeated vomiting.   You faint or have difficulty with vision.   You develop unusual numbness or weakness of your arms or legs.   Relief of pain is inadequate with medication, or you develop severe pain.   You develop confusion, or neck stiffness.   You have a worsening of a headache or do not obtain relief.  Document Released: 02/02/2005 Document Revised: 01/22/2011 Document Reviewed: 07/29/2006 [facility] Patient Information 2012 [place], [place].

## 2012-09-28 NOTE — Progress Notes (Signed)
Patient ID: Claudia Murphy, female   DOB: 1955-12-19, 57 y.o.   MRN: [id number] History of Present Illness: Claudia Murphy is a 57 year old white female  married in for a pap and physical.She is complaining of a headache since last night and she did not work. She is sensitive to smells and light.  Current Medications, Allergies, Past Medical History, Past Surgical History, Family History and Social History were reviewed in [employer] record.     Review of Systems: Patient denies any blurred vision, shortness of breath, chest pain, abdominal pain, problems with bowel movements, urination, or intercourse. Not having sex husband had a stroke in April,she has knee and back pain some, no mood changes.    Physical Exam:BP 120/80  Pulse 72  Ht 5\' 1"  (1.549 m)  Wt 168 lb (76.204 kg)  BMI 31.76 kg/m2 General:  Well developed, well nourished, no acute distress Skin:  Warm and dry, CN 2-12 intact PERRL Neck:  Midline trachea, normal thyroid Lungs; Clear to auscultation bilaterally Breast:  No dominant palpable mass, retraction, or nipple discharge on right the left breast is absent and no masses or tenderness of chest wall Cardiovascular: Regular rate and rhythm Abdomen:  Soft, non tender, no hepatosplenomegaly Pelvic:  External genitalia is normal in appearance.  The vagina is normal in appearance for age. The cervix is bulbous. Pap performed with HPV. Uterus is felt to be normal size, shape, and contour.  No adnexal masses or tenderness noted. Rectal: Good sphincter tone, no polyps, or hemorrhoids felt.  Hemoccult negative. Extremities:  No swelling  noted Psych:  Alert and cooperative seems happy   Impression: Yearly gyn exam History left breast cancer Headache     Plan: Check CBC,CMP,TSH and lipids Rx phenergan 25 mg #30 1 every 6 hours prn N/V with 1 refill Take norco that has at home if needed for pain Physical in 1 year Mammogram yearly  Colonoscopy per  GI Refilled boniva x 1 year Note given to return to work [employer]

## 2012-09-30 ENCOUNTER — Telehealth: Payer: Self-pay | Admitting: Adult Health

## 2012-09-30 NOTE — Telephone Encounter (Signed)
Pt aware of labs will mail her copy for work

## 2012-12-26 ENCOUNTER — Ambulatory Visit (HOSPITAL_COMMUNITY)
Admission: RE | Admit: 2012-12-26 | Discharge: 2012-12-26 | Disposition: A | Discharge status: Home / Self Care | Payer: Managed Care, Other (non HMO) | Source: Ambulatory Visit | Attending: Anesthesiology | Admitting: Anesthesiology

## 2012-12-26 ENCOUNTER — Other Ambulatory Visit (HOSPITAL_COMMUNITY): Payer: Self-pay | Admitting: Anesthesiology

## 2012-12-26 DIAGNOSIS — G8929 Other chronic pain: Secondary | ICD-10-CM

## 2012-12-26 DIAGNOSIS — M5137 Other intervertebral disc degeneration, lumbosacral region: Secondary | ICD-10-CM | POA: Insufficient documentation

## 2012-12-26 DIAGNOSIS — M533 Sacrococcygeal disorders, not elsewhere classified: Secondary | ICD-10-CM | POA: Insufficient documentation

## 2012-12-26 DIAGNOSIS — M51379 Other intervertebral disc degeneration, lumbosacral region without mention of lumbar back pain or lower extremity pain: Secondary | ICD-10-CM | POA: Insufficient documentation

## 2013-08-25 ENCOUNTER — Other Ambulatory Visit: Payer: Self-pay | Admitting: Obstetrics and Gynecology

## 2013-08-25 ENCOUNTER — Ambulatory Visit (HOSPITAL_COMMUNITY): Payer: Managed Care, Other (non HMO)

## 2013-08-25 ENCOUNTER — Other Ambulatory Visit: Payer: Self-pay | Admitting: Physician Assistant

## 2013-08-25 DIAGNOSIS — Z139 Encounter for screening, unspecified: Secondary | ICD-10-CM

## 2013-08-30 ENCOUNTER — Encounter (HOSPITAL_COMMUNITY): Payer: Managed Care, Other (non HMO)

## 2013-08-30 ENCOUNTER — Ambulatory Visit (HOSPITAL_COMMUNITY)
Admission: RE | Admit: 2013-08-30 | Discharge: 2013-08-30 | Disposition: A | Discharge status: Home / Self Care | Payer: Managed Care, Other (non HMO) | Source: Ambulatory Visit | Attending: Physician Assistant | Admitting: Physician Assistant

## 2013-08-30 DIAGNOSIS — Z1231 Encounter for screening mammogram for malignant neoplasm of breast: Secondary | ICD-10-CM | POA: Insufficient documentation

## 2013-08-30 DIAGNOSIS — Z139 Encounter for screening, unspecified: Secondary | ICD-10-CM

## 2013-09-08 ENCOUNTER — Ambulatory Visit (HOSPITAL_COMMUNITY): Payer: Managed Care, Other (non HMO)

## 2013-09-15 ENCOUNTER — Encounter (HOSPITAL_COMMUNITY): Payer: Self-pay

## 2013-09-15 ENCOUNTER — Encounter (HOSPITAL_COMMUNITY): Payer: Managed Care, Other (non HMO) | Attending: Hematology and Oncology

## 2013-09-15 ENCOUNTER — Encounter (HOSPITAL_BASED_OUTPATIENT_CLINIC_OR_DEPARTMENT_OTHER): Payer: Managed Care, Other (non HMO)

## 2013-09-15 VITALS — BP 147/95 | HR 101 | Temp 98.2°F | Resp 18 | Wt 176.8 lb

## 2013-09-15 DIAGNOSIS — R229 Localized swelling, mass and lump, unspecified: Secondary | ICD-10-CM | POA: Insufficient documentation

## 2013-09-15 DIAGNOSIS — Z853 Personal history of malignant neoplasm of breast: Secondary | ICD-10-CM

## 2013-09-15 DIAGNOSIS — Z87891 Personal history of nicotine dependence: Secondary | ICD-10-CM | POA: Insufficient documentation

## 2013-09-15 DIAGNOSIS — F411 Generalized anxiety disorder: Secondary | ICD-10-CM | POA: Insufficient documentation

## 2013-09-15 DIAGNOSIS — Z86718 Personal history of other venous thrombosis and embolism: Secondary | ICD-10-CM

## 2013-09-15 DIAGNOSIS — Z9221 Personal history of antineoplastic chemotherapy: Secondary | ICD-10-CM | POA: Insufficient documentation

## 2013-09-15 DIAGNOSIS — E559 Vitamin D deficiency, unspecified: Secondary | ICD-10-CM

## 2013-09-15 DIAGNOSIS — M129 Arthropathy, unspecified: Secondary | ICD-10-CM | POA: Insufficient documentation

## 2013-09-15 DIAGNOSIS — C50912 Malignant neoplasm of unspecified site of left female breast: Secondary | ICD-10-CM

## 2013-09-15 DIAGNOSIS — IMO0001 Reserved for inherently not codable concepts without codable children: Secondary | ICD-10-CM | POA: Insufficient documentation

## 2013-09-15 DIAGNOSIS — Z9010 Acquired absence of unspecified breast and nipple: Secondary | ICD-10-CM | POA: Insufficient documentation

## 2013-09-15 DIAGNOSIS — R222 Localized swelling, mass and lump, trunk: Secondary | ICD-10-CM

## 2013-09-15 LAB — COMPREHENSIVE METABOLIC PANEL
ALK PHOS: 79 U/L (ref 39–117)
ALT: 23 U/L (ref 0–35)
ANION GAP: 12 (ref 5–15)
AST: 28 U/L (ref 0–37)
Albumin: 4.3 g/dL (ref 3.5–5.2)
BILIRUBIN TOTAL: 0.5 mg/dL (ref 0.3–1.2)
BUN: 12 mg/dL (ref 6–23)
CHLORIDE: 101 meq/L (ref 96–112)
CO2: 30 mEq/L (ref 19–32)
Calcium: 10.1 mg/dL (ref 8.4–10.5)
Creatinine, Ser: 0.57 mg/dL (ref 0.50–1.10)
GFR calc Af Amer: >90 mL/min (ref >90)
GFR calc non Af Amer: >90 mL/min (ref >90)
GLUCOSE: 88 mg/dL (ref 70–99)
POTASSIUM: 3.6 meq/L — AB (ref 3.7–5.3)
SODIUM: 143 meq/L (ref 137–147)
TOTAL PROTEIN: 7.8 g/dL (ref 6.0–8.3)

## 2013-09-15 LAB — CBC WITH DIFFERENTIAL/PLATELET
Basophils Absolute: 0.1 10*3/uL (ref 0.0–0.1)
Basophils Relative: 1 % (ref 0–1)
Eosinophils Absolute: 0.2 10*3/uL (ref 0.0–0.7)
Eosinophils Relative: 2 % (ref 0–5)
HCT: 38.3 % (ref 36.0–46.0)
HEMOGLOBIN: 12.5 g/dL (ref 12.0–15.0)
LYMPHS ABS: 2.2 10*3/uL (ref 0.7–4.0)
Lymphocytes Relative: 23 % (ref 12–46)
MCH: 29.3 pg (ref 26.0–34.0)
MCHC: 32.6 g/dL (ref 30.0–36.0)
MCV: 89.9 fL (ref 78.0–100.0)
Monocytes Absolute: 0.6 10*3/uL (ref 0.1–1.0)
Monocytes Relative: 6 % (ref 3–12)
NEUTROS PCT: 68 % (ref 43–77)
Neutro Abs: 6.5 10*3/uL (ref 1.7–7.7)
Platelets: 327 10*3/uL (ref 150–400)
RBC: 4.26 MIL/uL (ref 3.87–5.11)
RDW: 12.7 % (ref 11.5–15.5)
WBC: 9.5 10*3/uL (ref 4.0–10.5)

## 2013-09-15 LAB — CEA: CEA: 1.1 ng/mL (ref 0.0–5.0)

## 2013-09-15 LAB — CANCER ANTIGEN 27.29: CA 27.29: 22 U/mL (ref 0–39)

## 2013-09-15 NOTE — Patient Instructions (Addendum)
..  [facility] Discharge Instructions  RECOMMENDATIONS MADE BY THE CONSULTANT AND ANY TEST RESULTS WILL BE SENT TO YOUR REFERRING PHYSICIAN.  EXAM FINDINGS BY THE PHYSICIAN TODAY AND SIGNS OR SYMPTOMS TO REPORT TO CLINIC OR PRIMARY PHYSICIAN: Exam and findings as discussed by Dr. Barnet Glasgow.  Senna plus laxative take 2 a day  INSTRUCTIONS/FOLLOW-UP: Dexa scan at [facility]  Return in 1 year   Thank you for choosing [facility] to provide your oncology and hematology care.  To afford each patient quality time with our providers, please arrive at least 15 minutes before your scheduled appointment time.  With your help, our goal is to use those 15 minutes to complete the necessary work-up to ensure our physicians have the information they need to help with your evaluation and healthcare recommendations.    Effective January 1st, 2014, we ask that you re-schedule your appointment with our physicians should you arrive 10 or more minutes late for your appointment.  We strive to give you quality time with our providers, and arriving late affects you and other patients whose appointments are after yours.    Again, thank you for choosing [facility].  Our hope is that these requests will decrease the amount of time that you wait before being seen by our physicians.       _____________________________________________________________  Should you have questions after your visit to [facility], please contact our office at [phone] between the hours of 8:30 a.m. and 4:30 p.m.  Voicemails left after 4:30 p.m. will not be returned until the following business day.  For prescription refill requests, have your pharmacy contact our office with your prescription refill request.    _______________________________________________________________  We hope that we have given you very good care.  You may receive a patient satisfaction survey in the mail,  please complete it and return it as soon as possible.  We value your feedback!  _______________________________________________________________  Have you asked about our STAR program?  STAR stands for Survivorship Training and Rehabilitation, and this is a nationally recognized cancer care program that focuses on survivorship and rehabilitation.  Cancer and cancer treatments may cause problems, such as, pain, making you feel tired and keeping you from doing the things that you need or want to do. Cancer rehabilitation can help. Our goal is to reduce these troubling effects and help you have the best quality of life possible.  You may receive a survey from a nurse that asks questions about your current state of health.  Based on the survey results, all eligible patients will be referred to the [facility] program for an evaluation so we can better serve you!  A frequently asked questions sheet is available upon request.

## 2013-09-15 NOTE — Progress Notes (Signed)
Claudia Murphy  OFFICE PROGRESS NOTE  [name] MD [place] [address]  DIAGNOSIS: Infiltrating ductal carcinoma of breast, stage 1, left - Plan: CBC with Differential, Comprehensive metabolic panel, CEA, Cancer antigen 27.29, DG Bone Density, CEA, Cancer antigen 27.29, CBC with Differential, Comprehensive metabolic panel  Vitamin D deficiency - Plan: Vitamin D 25 hydroxy, DG Bone Density, Vitamin D 25 hydroxy  Chief Complaint  Patient presents with  . Follow-up  . Triple-negative left breast cancer    CURRENT THERAPY: Watchful expectation and surveillance.  INTERVAL HISTORY: Claudia Murphy 58 y.o. female returns for followup of stage I (T1 C. 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 She had a mastectomy and 03/04/2005 and finished all chemotherapy as of 06/03/2005.  Primary problem has been a pain on the left as well as back pain and foot discomfort after undergoing foot surgery. She is followed in the pain clinic and has had epidural injections as well as a variety of pharmacologic agents. She denies any fever, night sweats, vaginal bleeding or discharge, lymphedema, cough, wheezing, but with nasal drip currently due 2 allergies. She denies any chest pain, PND, orthopnea, palpitations, and did have a blood clot when her PICC line was removed in 2007. She is concerned about possible lymphedema while traveling on an airplane.   MEDICAL HISTORY: Past Medical History  Diagnosis Date  . Osteoporosis   . Carpal tunnel syndrome on both sides   . Cataract mature, total senile     "both" (01/25/2012)  . PONV (postoperative nausea and vomiting)   . Anxiety   . GERD (gastroesophageal reflux disease)   . Peripheral  neuropathy     "not diabetic" (01/25/2012)  . Pneumonia 1978    "when my son was born" (01/25/2012)  . Breast cancer     "left" (01/25/2012)  . 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 History of stomach ulcers ` 1990  . Left knee DJD   . Arthritis     "all over my body" (01/25/2012)  . DJD (degenerative joint disease)     "neck, shoulders, back" (01/25/2012)  . Osteoporosis   . Osteopenia   . Chronic lower back pain   . Glaucoma     INTERIM HISTORY: has Infiltrating ductal carcinoma of left breast, stage 1; Breast cancer; Carpal tunnel syndrome on both sides; Cataract mature, total senile; and Left knee DJD on her problem list.   Stage I (T1 C. N0 M0) grade 2 infiltrating ductal carcinoma the left breast triple negative ,14 nodes -6 a marker at 12% status post FEC x6 cycles in a dose dense fashion but with methotrexate substitute for epirubicin in the sixth cycle due to toxicity of the skin. She had a mastectomy and 03/04/2005 and finished all chemotherapy as of 06/03/2005   ALLERGIES:  is allergic to epirubicin; latex; methocarbamol; and penicillins.  MEDICATIONS: has a current medication list which includes the following prescription(s): alprazolam, aspirin, calcium-vitamin d, celecoxib, gabapentin, and hydrocodone-acetaminophen.  SURGICAL HISTORY:  Past Surgical History  Procedure Laterality Date  . Knee arthroscopy  1980's    "left" (01/25/2012)  . Injection knee  2013    lt  . Replacement unicondylar joint knee  01/25/2012    "left" (01/25/2012)  . Plantar fascia surgery  11/25/2010    "left" (01/25/2012)  . Peripherally inserted central catheter insertion  2007  . Thrombectomy / embolectomy subclavian artery  2007    "post PICC line removal" (01/25/2012)  . Tubal ligation  1970's  . Breast biopsy  2006    "left" (01/25/2012)  . Mastectomy  01/07    left  . Partial knee arthroplasty  01/25/2012    Procedure: UNICOMPARTMENTAL KNEE;  Surgeon: Lorn Junes, MD;  Location: [place];  Service: Orthopedics;  Laterality: Left;  LEFT KNEE UNICOMPARTMENTAL ARTHROPLASTY   . Foot surgery      FAMILY HISTORY: family history includes Diabetes in her mother, sister, sister, sister, sister, and son; Heart disease in her father; Hypertension in her father and mother.  SOCIAL HISTORY:  reports that she quit smoking about 8 years ago. Her smoking use included Cigarettes. She has a 49.5 pack-year smoking history. She has never used smokeless tobacco. She reports that she drinks alcohol. She reports that she does not use illicit drugs.  REVIEW OF SYSTEMS:  Other than that discussed above is noncontributory.  PHYSICAL EXAMINATION: ECOG PERFORMANCE STATUS: 1 - Symptomatic but completely ambulatory  Blood pressure 147/95, pulse 101, temperature 98.2 F (36.8 C), resp. rate 18, weight 176 lb 12.8 oz (80.196 kg).  GENERAL:alert, no distress and comfortable SKIN: skin color, texture, turgor are normal, no rashes or significant lesions EYES: PERLA; Conjunctiva are pink and non-injected, sclera clear SINUSES: No redness or tenderness over maxillary or ethmoid sinuses OROPHARYNX:no exudate, no erythema on lips, buccal mucosa, or tongue. NECK: supple, thyroid normal size, non-tender, without nodularity. No masses CHEST: Status post left mastectomy with a small erythematous subcutaneous lesion measuring approximately 5 mm in diameter in the middle of the surgical scar. Right breast without mass. LYMPH:  no palpable lymphadenopathy in the cervical, axillary or inguinal LUNGS: clear to auscultation and percussion with normal breathing effort HEART: regular rate & rhythm and no murmurs. ABDOMEN:abdomen soft, non-tender and normal bowel sounds MUSCULOSKELETAL:no cyanosis of digits and no clubbing. Range of motion normal.  NEURO: alert & oriented x 3 with fluent speech, no focal motor/sensory deficits   LABORATORY DATA: Office Visit on 09/15/2013  Component Date  Value Ref Range Status  . WBC 09/15/2013 9.5  4.0 - 10.5 K/uL Final  . RBC 09/15/2013 4.26  3.87 - 5.11 MIL/uL Final  . Hemoglobin 09/15/2013 12.5  12.0 - 15.0 g/dL Final  . HCT 09/15/2013 38.3  36.0 - 46.0 % Final  . MCV 09/15/2013 89.9  78.0 - 100.0 fL Final  . MCH 09/15/2013 29.3  26.0 - 34.0 pg Final  . MCHC 09/15/2013 32.6  30.0 - 36.0 g/dL Final  . RDW 09/15/2013 12.7  11.5 - 15.5 % Final  . Platelets 09/15/2013 327  150 - 400 K/uL Final  . Neutrophils Relative % 09/15/2013 68  43 - 77 % Final  . Neutro Abs 09/15/2013 6.5  1.7 - 7.7 K/uL Final  . Lymphocytes Relative 09/15/2013 23  12 - 46 % Final  . Lymphs Abs 09/15/2013 2.2  0.7 - 4.0 K/uL Final  . Monocytes Relative 09/15/2013 6  3 - 12 % Final  . Monocytes Absolute 09/15/2013 0.6  0.1 - 1.0 K/uL Final  . Eosinophils Relative 09/15/2013 2  0 - 5 % Final  . Eosinophils Absolute 09/15/2013 0.2  0.0 - 0.7 K/uL Final  . Basophils Relative 09/15/2013 1  0 - 1 %  Final  . Basophils Absolute 09/15/2013 0.1  0.0 - 0.1 K/uL Final    PATHOLOGY: Triple negative  Urinalysis    Component Value Date/Time   COLORURINE YELLOW 01/20/2012 1020   APPEARANCEUR CLEAR 01/20/2012 1020   LABSPEC 1.008 01/20/2012 1020   PHURINE 7.5 01/20/2012 1020   GLUCOSEU NEGATIVE 01/20/2012 1020   HGBUR NEGATIVE 01/20/2012 1020   BILIRUBINUR NEGATIVE 01/20/2012 1020   KETONESUR NEGATIVE 01/20/2012 1020   PROTEINUR NEGATIVE 01/20/2012 1020   UROBILINOGEN 0.2 01/20/2012 1020   NITRITE NEGATIVE 01/20/2012 1020   LEUKOCYTESUR NEGATIVE 01/20/2012 1020    RADIOGRAPHIC STUDIES:  09/15/2011: DEXA scan with osteopenia.   Mm Digital Screening Unilat R  08/30/2013   CLINICAL DATA:  Screening.  EXAM: DIGITAL SCREENING UNILATERAL RIGHT MAMMOGRAM WITH CAD  COMPARISON:  Previous exam(s)  ACR Breast Density Category c: The breast tissue is heterogeneously dense, which may obscure small masses.  FINDINGS: The patient has had a left mastectomy. There are no findings suspicious  for malignancy. Images were processed with CAD.  IMPRESSION: No mammographic evidence of malignancy. A result letter of this screening mammogram will be mailed directly to the patient.  RECOMMENDATION: Screening mammogram in one year. (Code:SM-B-01Y)  BI-RADS CATEGORY  1: Negative.   Electronically Signed   By: Shon Hale M.D.   On: 08/30/2013 16:12    ASSESSMENT:  #1 Stage I (T1 C. N0 M0) grade 2 infiltrating ductal carcinoma the left breast triple -14 nodes -6 a marker at 12% status post FEC x6 cycles in a dose dense fashion but with methotrexate substitute for epirubicin in the sixth cycle due to toxicity of the skin. She had a mastectomy and 03/04/2005 and finished all chemotherapy as of 06/03/2005, no evidence of disease but with small nodule in the left mastectomy scar measuring 5 mm. #2. Fibromyalgia. #3. DVT right axillary vein when PICC line was removed, no evidence of lymphedema at this time.    PLAN:  #1. Repeat DEXA scan and compared to July 2013 study. #2. Followup subcutaneous nodule and if enlarges, biopsy should be performed. #3. Followup in one year with CBC, chem profile, CEA, and CA 27-29 along with vitamin D level.   All questions were answered. The patient knows to call the clinic with any problems, questions or concerns. We can certainly see the patient much sooner if necessary.   I spent 30 minutes counseling the patient face to face. The total time spent in the appointment was 40 minutes.    Doroteo Bradford, MD 09/15/2013 9:57 AM  DISCLAIMER:  This note was dictated with voice recognition software.  Similar sounding words can inadvertently be transcribed inaccurately and may not be corrected upon review.

## 2013-09-15 NOTE — Progress Notes (Signed)
LABS DRAWN FOR [id number],CEA,[id number],CBCD,CMP.

## 2013-09-16 LAB — VITAMIN D 25 HYDROXY (VIT D DEFICIENCY, FRACTURES): Vit D, 25-Hydroxy: 60 ng/mL (ref 30–89)

## 2013-09-25 ENCOUNTER — Ambulatory Visit (HOSPITAL_COMMUNITY)
Admission: RE | Admit: 2013-09-25 | Discharge: 2013-09-25 | Disposition: A | Discharge status: Home / Self Care | Payer: Managed Care, Other (non HMO) | Source: Ambulatory Visit | Attending: Hematology and Oncology | Admitting: Hematology and Oncology

## 2013-09-25 DIAGNOSIS — C50912 Malignant neoplasm of unspecified site of left female breast: Secondary | ICD-10-CM

## 2013-09-25 DIAGNOSIS — C50919 Malignant neoplasm of unspecified site of unspecified female breast: Secondary | ICD-10-CM | POA: Insufficient documentation

## 2013-09-25 DIAGNOSIS — E559 Vitamin D deficiency, unspecified: Secondary | ICD-10-CM | POA: Insufficient documentation

## 2013-12-18 ENCOUNTER — Encounter (HOSPITAL_COMMUNITY): Payer: Self-pay

## 2013-12-22 ENCOUNTER — Other Ambulatory Visit: Payer: Self-pay | Admitting: Neurosurgery

## 2013-12-22 DIAGNOSIS — M4300 Spondylolysis, site unspecified: Secondary | ICD-10-CM

## 2013-12-25 ENCOUNTER — Other Ambulatory Visit: Payer: Self-pay | Admitting: Neurosurgery

## 2013-12-25 DIAGNOSIS — M4300 Spondylolysis, site unspecified: Secondary | ICD-10-CM

## 2014-01-05 ENCOUNTER — Ambulatory Visit
Admission: RE | Admit: 2014-01-05 | Discharge: 2014-01-05 | Disposition: A | Discharge status: Home / Self Care | Payer: Managed Care, Other (non HMO) | Source: Ambulatory Visit | Attending: Neurosurgery | Admitting: Neurosurgery

## 2014-01-05 DIAGNOSIS — M4300 Spondylolysis, site unspecified: Secondary | ICD-10-CM

## 2014-08-08 ENCOUNTER — Other Ambulatory Visit (HOSPITAL_COMMUNITY): Payer: Self-pay | Admitting: Family Medicine

## 2014-08-08 DIAGNOSIS — Z1231 Encounter for screening mammogram for malignant neoplasm of breast: Secondary | ICD-10-CM

## 2014-09-07 ENCOUNTER — Ambulatory Visit (HOSPITAL_COMMUNITY)
Admission: RE | Admit: 2014-09-07 | Discharge: 2014-09-07 | Disposition: A | Discharge status: Home / Self Care | Payer: Managed Care, Other (non HMO) | Source: Ambulatory Visit | Attending: Family Medicine | Admitting: Family Medicine

## 2014-09-07 DIAGNOSIS — Z1231 Encounter for screening mammogram for malignant neoplasm of breast: Secondary | ICD-10-CM | POA: Diagnosis not present

## 2014-09-07 DIAGNOSIS — Z9012 Acquired absence of left breast and nipple: Secondary | ICD-10-CM | POA: Insufficient documentation

## 2014-09-07 DIAGNOSIS — Z853 Personal history of malignant neoplasm of breast: Secondary | ICD-10-CM | POA: Diagnosis not present

## 2014-09-11 ENCOUNTER — Other Ambulatory Visit (HOSPITAL_COMMUNITY): Payer: Self-pay

## 2014-09-11 DIAGNOSIS — C50912 Malignant neoplasm of unspecified site of left female breast: Secondary | ICD-10-CM

## 2014-09-18 ENCOUNTER — Encounter (HOSPITAL_COMMUNITY): Payer: Self-pay | Admitting: Hematology & Oncology

## 2014-09-18 ENCOUNTER — Other Ambulatory Visit (HOSPITAL_COMMUNITY): Payer: Self-pay | Admitting: Oncology

## 2014-09-18 ENCOUNTER — Encounter (HOSPITAL_COMMUNITY): Payer: Managed Care, Other (non HMO) | Attending: Hematology & Oncology | Admitting: Hematology & Oncology

## 2014-09-18 ENCOUNTER — Encounter (HOSPITAL_BASED_OUTPATIENT_CLINIC_OR_DEPARTMENT_OTHER): Payer: Managed Care, Other (non HMO)

## 2014-09-18 VITALS — BP 126/86 | HR 16 | Temp 98.5°F | Resp 16 | Wt 173.4 lb

## 2014-09-18 DIAGNOSIS — C50912 Malignant neoplasm of unspecified site of left female breast: Secondary | ICD-10-CM | POA: Insufficient documentation

## 2014-09-18 DIAGNOSIS — Z803 Family history of malignant neoplasm of breast: Secondary | ICD-10-CM

## 2014-09-18 DIAGNOSIS — H6691 Otitis media, unspecified, right ear: Secondary | ICD-10-CM | POA: Diagnosis not present

## 2014-09-18 DIAGNOSIS — E559 Vitamin D deficiency, unspecified: Secondary | ICD-10-CM

## 2014-09-18 DIAGNOSIS — E876 Hypokalemia: Secondary | ICD-10-CM

## 2014-09-18 DIAGNOSIS — H65191 Other acute nonsuppurative otitis media, right ear: Secondary | ICD-10-CM | POA: Insufficient documentation

## 2014-09-18 DIAGNOSIS — Z853 Personal history of malignant neoplasm of breast: Secondary | ICD-10-CM

## 2014-09-18 LAB — CBC WITH DIFFERENTIAL/PLATELET
Basophils Absolute: 0.1 K/uL (ref 0.0–0.1)
Basophils Relative: 1 % (ref 0–1)
Eosinophils Absolute: 0.2 K/uL (ref 0.0–0.7)
Eosinophils Relative: 2 % (ref 0–5)
HCT: 38.2 % (ref 36.0–46.0)
Hemoglobin: 12.3 g/dL (ref 12.0–15.0)
Lymphocytes Relative: 25 % (ref 12–46)
Lymphs Abs: 2.5 K/uL (ref 0.7–4.0)
MCH: 28.9 pg (ref 26.0–34.0)
MCHC: 32.2 g/dL (ref 30.0–36.0)
MCV: 89.9 fL (ref 78.0–100.0)
Monocytes Absolute: 0.6 K/uL (ref 0.1–1.0)
Monocytes Relative: 6 % (ref 3–12)
Neutro Abs: 6.5 K/uL (ref 1.7–7.7)
Neutrophils Relative %: 66 % (ref 43–77)
Platelets: 319 K/uL (ref 150–400)
RBC: 4.25 MIL/uL (ref 3.87–5.11)
RDW: 12.6 % (ref 11.5–15.5)
WBC: 9.7 K/uL (ref 4.0–10.5)

## 2014-09-18 LAB — COMPREHENSIVE METABOLIC PANEL WITH GFR
ALT: 18 U/L (ref 14–54)
AST: 25 U/L (ref 15–41)
Albumin: 4.1 g/dL (ref 3.5–5.0)
Alkaline Phosphatase: 73 U/L (ref 38–126)
Anion gap: 11 (ref 5–15)
BUN: 17 mg/dL (ref 6–20)
CO2: 26 mmol/L (ref 22–32)
Calcium: 9.2 mg/dL (ref 8.9–10.3)
Chloride: 102 mmol/L (ref 101–111)
Creatinine, Ser: 0.53 mg/dL (ref 0.44–1.00)
GFR calc Af Amer: >60 mL/min
GFR calc non Af Amer: >60 mL/min
Glucose, Bld: 122 mg/dL — ABNORMAL HIGH (ref 65–99)
Potassium: 3.3 mmol/L — ABNORMAL LOW (ref 3.5–5.1)
Sodium: 139 mmol/L (ref 135–145)
Total Bilirubin: 0.4 mg/dL (ref 0.3–1.2)
Total Protein: 7.3 g/dL (ref 6.5–8.1)

## 2014-09-18 MED ORDER — POTASSIUM CHLORIDE CRYS ER 20 MEQ PO TBCR: 20.0000 meq | EXTENDED_RELEASE_TABLET | Freq: Two times a day (BID) | ORAL | Status: DC

## 2014-09-18 MED ORDER — AZITHROMYCIN 250 MG PO TABS: ORAL_TABLET | ORAL | Status: DC

## 2014-09-18 NOTE — Progress Notes (Signed)
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 female is here because of stage I (T1 C. 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 She had a mastectomy and 03/04/2005 and finished all chemotherapy as of 06/03/2005.   The patient was diagnosed with breast cancer in late 2006.  She had a mastectomy done by Dr. Arnoldo Morale.  She has one son, two grandsons, and has never been genetically tested.  The patient's maternal aunt was diagnosed with breast cancer in her 74's, she had a chest wall recurrence and ultimately died from her disease in her early 84's.    Her maternal uncle was diagnosed with gastric cancer and deceased in his 27's.  She is the youngest of 6 girls and no other person in her family has a history of cancer.   She has had a screening colonoscopy, done by Dr.Rourke, and had polyps found  She still has her uterus.   She has recently had a mammogram done   She is requesting I look at her R ear, she complains of R ear pain and a sore throat for several days. The patient is an ex-smoker.  She quit in 2006 before she was diagnosed with cancer.   MEDICAL HISTORY:  Past Medical History  Diagnosis Date  . Osteoporosis   . Carpal tunnel syndrome on both sides   . Cataract mature, total senile     "both" (01/25/2012)  . PONV (postoperative nausea and vomiting)   . Anxiety   . GERD (gastroesophageal reflux disease)   . Peripheral neuropathy     "not diabetic" (01/25/2012)  . Pneumonia 1978    "when my son was born" (01/25/2012)  . Breast cancer       "left" (01/25/2012)  . 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 History of stomach ulcers ` 1990  . Left knee DJD   . Arthritis     "all over my body" (01/25/2012)  . DJD (degenerative joint disease)     "neck, shoulders, back" (01/25/2012)  . Osteoporosis   . Osteopenia   . Chronic lower back pain   . Glaucoma     SURGICAL HISTORY: Past Surgical History  Procedure Laterality Date  . Knee arthroscopy  1980's    "left" (01/25/2012)  . Injection knee  2013    lt  . Replacement unicondylar joint knee  01/25/2012    "left" (01/25/2012)  . Plantar fascia surgery  11/25/2010    "left" (01/25/2012)  . Peripherally inserted central catheter insertion  2007  . Thrombectomy / embolectomy subclavian artery  2007    "post PICC line removal" (01/25/2012)  . Tubal ligation  1970's  . Breast biopsy  2006    "left" (01/25/2012)  . Mastectomy  01/07    left  . Partial knee arthroplasty  01/25/2012    Procedure: UNICOMPARTMENTAL KNEE;  Surgeon: Lorn Junes, MD;  Location: [facility];  Service: Orthopedics;  Laterality: Left;  LEFT KNEE UNICOMPARTMENTAL ARTHROPLASTY   . Foot surgery  SOCIAL HISTORY: History   Social History  . Marital Status: Married    Spouse Name: N/A  . Number of Children: N/A  . Years of Education: N/A   Occupational History  . Not on file.   Social History Main Topics  . Smoking status: Former Smoker -- 1.50 packs/day for 33 years    Types: Cigarettes    Quit date: 11/16/2004  . Smokeless tobacco: Never Used  . Alcohol Use: Yes     Comment: 01/25/2012 "none in awhile; glass of wine couple times/yr"  . Drug Use: No  . Sexual Activity: Not Currently   Other Topics Concern  . Not on file   Social History Narrative    FAMILY HISTORY: Family History  Problem Relation Age of Onset  . Diabetes Mother   . Hypertension Mother   . Heart disease Father   . Hypertension Father   . Diabetes Son   . Diabetes Sister   . Diabetes  Sister   . Diabetes Sister   . Diabetes Sister    indicated that her mother is alive. She indicated that her father is deceased. She indicated that all of her five sisters are alive. She indicated that her brother is alive. She indicated that her maternal grandmother is deceased. She indicated that her maternal grandfather is deceased. She indicated that her paternal grandmother is deceased. She indicated that her paternal grandfather is deceased. She indicated that her son is alive.   ALLERGIES:  is allergic to epirubicin; latex; methocarbamol; and penicillins.  MEDICATIONS:  Current Outpatient Prescriptions  Medication Sig Dispense Refill  . ALPRAZolam (XANAX) 0.5 MG tablet Take 0.5 mg by mouth 3 (three) times daily as needed. For anxiety    . aspirin 81 MG tablet Take 81 mg by mouth daily.    . calcium-vitamin D (OSCAL WITH D) 500-200 MG-UNIT per tablet Take 1 tablet by mouth 2 (two) times daily.     . celecoxib (CELEBREX) 200 MG capsule Take 200 mg by mouth daily.     Marland Kitchen docusate sodium (COLACE) 100 MG capsule Take 100 mg by mouth daily.    Marland Kitchen gabapentin (NEURONTIN) 300 MG capsule Take 400 mg by mouth 4 (four) times daily.     Marland Kitchen HYDROcodone-acetaminophen (NORCO) 7.5-325 MG per tablet Take 1 tablet by mouth every 6 (six) hours as needed for pain.    Marland Kitchen azithromycin (ZITHROMAX) 250 MG tablet Take 2 tablets by mouth today then one tablet daily thereafter until gone 11 each 0  . potassium chloride SA (K-DUR,KLOR-CON) 20 MEQ tablet Take 1 tablet (20 mEq total) by mouth 2 (two) times daily. 30 tablet 0   No current facility-administered medications for this visit.    Review of Systems  Constitutional: Negative for fever, chills, weight loss, malaise/fatigue and diaphoresis.  HENT: Positive for congestion and ear pain. Negative for ear discharge, hearing loss, nosebleeds and tinnitus.   Eyes: Negative.   Respiratory: Negative.   Cardiovascular: Negative.   Gastrointestinal: Negative.     Genitourinary: Negative.   Musculoskeletal: Positive for back pain.  Skin: Negative for itching and rash.  Neurological: Negative.  Negative for headaches.  Endo/Heme/Allergies: Negative.   Psychiatric/Behavioral: Negative.    14 point ROS was done and is otherwise as detailed above or in HPI   PHYSICAL EXAMINATION: ECOG PERFORMANCE STATUS: 0 - Asymptomatic  Filed Vitals:   09/18/14 0924  BP: 126/86  Pulse: 16  Temp: 98.5 F (36.9 C)  Resp: 16   Filed Weights  09/18/14 0924  Weight: 173 lb 6.4 oz (78.654 kg)     Physical Exam  Constitutional: She is oriented to person, place, and time and well-developed, well-nourished, and in no distress.  HENT:  Head: Normocephalic and atraumatic.  Nose: Nose normal.  Mouth/Throat: Oropharynx is clear and moist. No oropharyngeal exudate.  R TM with erythema and dullness. No oropharyngeal exudate but positive erythema  Eyes: Conjunctivae and EOM are normal. Pupils are equal, round, and reactive to light. Right eye exhibits no discharge. Left eye exhibits no discharge. No scleral icterus.  Neck: Normal range of motion. Neck supple. No tracheal deviation present. No thyromegaly present.  Cardiovascular: Normal rate, regular rhythm and normal heart sounds.  Exam reveals no gallop and no friction rub.   No murmur heard. Pulmonary/Chest: Effort normal and breath sounds normal. She has no wheezes. She has no rales.    Abdominal: Soft. Bowel sounds are normal. She exhibits no distension and no mass. There is no tenderness. There is no rebound and no guarding.  Musculoskeletal: Normal range of motion. She exhibits no edema.  Lymphadenopathy:    She has no cervical adenopathy.  Neurological: She is alert and oriented to person, place, and time. She has normal reflexes. No cranial nerve deficit. Gait normal. Coordination normal.  Skin: Skin is warm and dry. No rash noted.  Psychiatric: Mood, memory, affect and judgment normal.  Nursing note  and vitals reviewed.   LABORATORY DATA:  I have reviewed the data as listed Lab Results  Component Value Date   WBC 9.7 09/18/2014   HGB 12.3 09/18/2014   HCT 38.2 09/18/2014   MCV 89.9 09/18/2014   PLT 319 09/18/2014    RADIOGRAPHIC STUDIES: I have personally reviewed the radiological images as listed and agreed with the findings in the report. CLINICAL DATA: Screening. History of treated left breast cancer, status post mastectomy in 2007.  EXAM: DIGITAL SCREENING UNILATERAL RIGHT MAMMOGRAM WITH TOMO AND CAD  COMPARISON: Previous exam(s).  ACR Breast Density Category c: The breast tissue is heterogeneously dense, which may obscure small masses.  FINDINGS: The patient has had a left mastectomy. There are no findings suspicious for malignancy.  IMPRESSION: No mammographic evidence of malignancy. A result letter of this screening mammogram will be mailed directly to the patient.  RECOMMENDATION: Screening mammogram in one year. ([id number])  BI-RADS CATEGORY 1: Negative.   Electronically Signed  By: Fidela Salisbury M.D.  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 Breast exam today is benign. She is up-to-date on screening mammography. She has not had genetic counseling, given her age at diagnosis I have recommended genetic counseling. She is agreeable. I will tentatively schedule her for a year follow-up. I advised her to call in the interim with any problems or concerns.  I have called her in a prescription for Zithromax for her otitis media. She is penicillin allergic and also does not believe she can tolerate cephalosporins.  All questions were answered. The patient knows to call the clinic with any problems, questions or concerns.   This document serves as a record of services  personally performed by Ancil Linsey, MD. It was created on her behalf by Janace Hoard, a trained medical scribe. The creation of this record is based on the scribe's personal observations and the provider's statements to them. This  document has been checked and approved by the attending provider.  I have reviewed the above documentation for accuracy and completeness, and I agree with the above.  This note was electronically signed.  Kelby Fam. Whitney Muse, MD

## 2014-09-18 NOTE — Progress Notes (Signed)
LABS DRAWN

## 2014-09-18 NOTE — Patient Instructions (Addendum)
[facility] at [facility] Discharge Instructions  RECOMMENDATIONS MADE BY THE CONSULTANT AND ANY TEST RESULTS WILL BE SENT TO YOUR REFERRING PHYSICIAN.  Exam and discussion by Dr. Whitney Muse. Dr. Whitney Muse will prescribe an antibiotic for you Will get copy of your colonoscopy report from Dr. Roseanne Kaufman office. Will make a referral for genetics counseling - they will meet with you here on Thursday at 1pm. Report any new lumps, bone pain, shortness of breath or other symptoms.  Follow-up in 1 year  Thank you for choosing [facility] at [facility] to provide your oncology and hematology care.  To afford each patient quality time with our provider, please arrive at least 15 minutes before your scheduled appointment time.    You need to re-schedule your appointment should you arrive 10 or more minutes late.  We strive to give you quality time with our providers, and arriving late affects you and other patients whose appointments are after yours.  Also, if you no show three or more times for appointments you may be dismissed from the clinic at the providers discretion.     Again, thank you for choosing [facility].  Our hope is that these requests will decrease the amount of time that you wait before being seen by our physicians.       _____________________________________________________________  Should you have questions after your visit to [facility], please contact our office at [phone] between the hours of 8:30 a.m. and 4:30 p.m.  Voicemails left after 4:30 p.m. will not be returned until the following business day.  For prescription refill requests, have your pharmacy contact our office.

## 2014-09-19 LAB — VITAMIN D 25 HYDROXY (VIT D DEFICIENCY, FRACTURES): Vit D, 25-Hydroxy: 32 ng/mL (ref 30.0–100.0)

## 2014-09-20 ENCOUNTER — Encounter (HOSPITAL_COMMUNITY): Payer: Managed Care, Other (non HMO) | Admitting: Genetic Counselor

## 2014-12-11 ENCOUNTER — Ambulatory Visit
Admission: EM | Admit: 2014-12-11 | Discharge: 2014-12-11 | Disposition: A | Discharge status: Home / Self Care | Payer: Worker's Compensation | Attending: Family Medicine | Admitting: Family Medicine

## 2014-12-11 ENCOUNTER — Encounter: Payer: Self-pay | Admitting: Emergency Medicine

## 2014-12-11 ENCOUNTER — Ambulatory Visit (INDEPENDENT_AMBULATORY_CARE_PROVIDER_SITE_OTHER): Payer: Worker's Compensation

## 2014-12-11 DIAGNOSIS — S8002XA Contusion of left knee, initial encounter: Secondary | ICD-10-CM

## 2014-12-11 NOTE — ED Notes (Signed)
Patient c/o left knee pain and swelling after tripping and falling on her left knee at work on 11/20/14.  Patient states that she has not been seen for this injury until now.  Patient states that the nurse at work told that she should be seen today since she is still having swelling in her left knee and had a knee replacement in the left knee before.

## 2014-12-11 NOTE — ED Provider Notes (Signed)
CSN: [id number]     Arrival date & time 12/11/14  0741 History   First MD Initiated Contact with Patient 12/11/14 [phone]     Chief Complaint  Patient presents with  . Worker's Comp Injury    (Consider location/radiation/quality/duration/timing/severity/associated sxs/prior Treatment) HPI Comments: 59 yo female presents with a c/o mild knee pain after fall injury at work on 11/20/14. States tripped over a piece of metal and landed on her knee. Has been icing and taking over the counter analgesics and states symptoms are improving (less pain and swelling subsided). However, patient has a h/o partial knee replacement to same knee years ago.   The history is provided by the patient.    Past Medical History  Diagnosis Date  . Osteoporosis   . Carpal tunnel syndrome on both sides   . Cataract mature, total senile     "both" (01/25/2012)  . PONV (postoperative nausea and vomiting)   . Anxiety   . GERD (gastroesophageal reflux disease)   . Peripheral neuropathy ([place])     "not diabetic" (01/25/2012)  . Pneumonia 1978    "when my son was born" (01/25/2012)  . Breast cancer ([place])     "left" (01/25/2012)  . 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 History of stomach ulcers ` 1990  . Left knee DJD   . Arthritis     "all over my body" (01/25/2012)  . DJD (degenerative joint disease)     "neck, shoulders, back" (01/25/2012)  . Osteoporosis   . Osteopenia   . Chronic lower back pain   . Glaucoma    Past Surgical History  Procedure Laterality Date  . Knee arthroscopy  1980's    "left" (01/25/2012)  . Injection knee  2013    lt  . Replacement unicondylar joint knee  01/25/2012    "left" (01/25/2012)  . Plantar fascia surgery  11/25/2010    "left" (01/25/2012)  . Peripherally inserted central catheter insertion  2007  . Thrombectomy / embolectomy subclavian artery  2007    "post PICC line removal" (01/25/2012)  . Tubal ligation  1970's  . Breast biopsy  2006    "left"  (01/25/2012)  . Mastectomy  01/07    left  . Partial knee arthroplasty  01/25/2012    Procedure: UNICOMPARTMENTAL KNEE;  Surgeon: Lorn Junes, MD;  Location: [place] [facility];  Service: Orthopedics;  Laterality: Left;  LEFT KNEE UNICOMPARTMENTAL ARTHROPLASTY   . Foot surgery     Family History  Problem Relation Age of Onset  . Diabetes Mother   . Hypertension Mother   . Heart disease Father   . Hypertension Father   . Diabetes Son   . Diabetes Sister   . Diabetes Sister   . Diabetes Sister   . Diabetes Sister    Social History  Substance Use Topics  . Smoking status: Former Smoker -- 1.50 packs/day for 33 years    Types: Cigarettes    Quit date: 11/16/2004  . Smokeless tobacco: Never Used  . Alcohol Use: Yes     Comment: 01/25/2012 "none in awhile; glass of wine couple times/yr"   OB History    Gravida Para Term Preterm AB TAB SAB Ectopic Multiple Living   1 1        1      Review of Systems  Allergies  Epirubicin; Latex; Methocarbamol; and Penicillins  Home Medications   Prior to Admission medications   Medication Sig Start Date End Date Taking?  Authorizing Provider  ALPRAZolam Duanne Moron) 0.5 MG tablet Take 0.5 mg by mouth 3 (three) times daily as needed. For anxiety    Historical Provider, MD  aspirin 81 MG tablet Take 81 mg by mouth daily.    Historical Provider, MD  azithromycin (ZITHROMAX) 250 MG tablet Take 2 tablets by mouth today then one tablet daily thereafter until gone 09/18/14   Patrici Ranks, MD  calcium-vitamin D (OSCAL WITH D) 500-200 MG-UNIT per tablet Take 1 tablet by mouth 2 (two) times daily.     Historical Provider, MD  celecoxib (CELEBREX) 200 MG capsule Take 200 mg by mouth daily.     Historical Provider, MD  docusate sodium (COLACE) 100 MG capsule Take 100 mg by mouth daily.    Historical Provider, MD  gabapentin (NEURONTIN) 300 MG capsule Take 400 mg by mouth 4 (four) times daily.     Historical Provider, MD  HYDROcodone-acetaminophen (NORCO) 7.5-325 MG  per tablet Take 1 tablet by mouth every 6 (six) hours as needed for pain.    Historical Provider, MD  potassium chloride SA (K-DUR,KLOR-CON) 20 MEQ tablet Take 1 tablet (20 mEq total) by mouth 2 (two) times daily. 09/18/14   Baird Cancer, PA-C   Meds Ordered and Administered this Visit  Medications - No data to display  BP 131/92 mmHg  Pulse 97  Temp(Src) 97.1 F (36.2 C) (Tympanic)  Resp 16  Ht 5\' 1"  (1.549 m)  Wt 173 lb (78.472 kg)  BMI 32.70 kg/m2  SpO2 100%  LMP  No data found.   Physical Exam  Constitutional: She appears well-developed and well-nourished. No distress.  Musculoskeletal:       Left knee: She exhibits bony tenderness (mild over proximal tibia). She exhibits normal range of motion, no swelling, no effusion, no ecchymosis, no deformity, no laceration, no erythema, normal alignment, no LCL laxity, normal patellar mobility, normal meniscus and no MCL laxity. No tenderness found.  Well healed mid line knee surgical scar  Skin: She is not diaphoretic.  Vitals reviewed.   ED Course  Procedures (including critical care time)  Labs Review Labs Reviewed - No data to display  Imaging Review Dg Knee Complete 4 Views Left  12/11/2014  CLINICAL DATA:  Tripped at work 3 weeks ago, left knee injury, pain below patella laterally, history of knee replacement EXAM: LEFT KNEE - COMPLETE 4+ VIEW COMPARISON:  None. FINDINGS: Four views of the left knee submitted. No acute fracture or subluxation. The patient is status post medial left knee hemi arthroplasty. No evidence of loosening of prosthesis. The alignment is preserved. Small joint effusion. IMPRESSION: No acute fracture or subluxation. No evidence of loosening of prosthesis. Small joint effusion. Electronically Signed   By: Lahoma Crocker M.D.   On: 12/11/2014 08:43     Visual Acuity Review  Right Eye Distance:   Left Eye Distance:   Bilateral Distance:    Right Eye Near:   Left Eye Near:    Bilateral Near:          MDM   1. Knee contusion, left, initial encounter    1. x-ray results and diagnosis reviewed with patient 2. Recommend supportive treatment with otc analgesics, rest, ice  3. May return to regular job duties 4. Follow prn if symptoms worsen or don't improve     Norval Gable, MD 12/11/14 [phone]

## 2015-01-04 ENCOUNTER — Ambulatory Visit (INDEPENDENT_AMBULATORY_CARE_PROVIDER_SITE_OTHER): Payer: Managed Care, Other (non HMO) | Admitting: Adult Health

## 2015-01-04 ENCOUNTER — Encounter: Payer: Self-pay | Admitting: Adult Health

## 2015-01-04 VITALS — BP 140/80 | HR 102 | Ht 61.0 in | Wt 173.5 lb

## 2015-01-04 DIAGNOSIS — Z01419 Encounter for gynecological examination (general) (routine) without abnormal findings: Secondary | ICD-10-CM | POA: Diagnosis not present

## 2015-01-04 DIAGNOSIS — M25511 Pain in right shoulder: Secondary | ICD-10-CM

## 2015-01-04 DIAGNOSIS — Z1212 Encounter for screening for malignant neoplasm of rectum: Secondary | ICD-10-CM

## 2015-01-04 DIAGNOSIS — L723 Sebaceous cyst: Secondary | ICD-10-CM

## 2015-01-04 HISTORY — DX: Sebaceous cyst: L72.3

## 2015-01-04 HISTORY — DX: Pain in right shoulder: M25.511

## 2015-01-04 LAB — HEMOCCULT GUIAC POC 1CARD (OFFICE): FECAL OCCULT BLD: NEGATIVE

## 2015-01-04 MED ORDER — SULFAMETHOXAZOLE-TRIMETHOPRIM 800-160 MG PO TABS: 1.0000 | ORAL_TABLET | Freq: Two times a day (BID) | ORAL | Status: DC

## 2015-01-04 NOTE — Progress Notes (Signed)
Patient ID: Claudia Murphy, female   DOB: 01/23/1956, 59 y.o.   MRN: [id number] History of Present Illness: [name] is a 59 year old white female,married in for a well woman gyn exam,she had a normal pap with negative HPV 09/28/12.She has history of breast cancer.She has tender knot in rectal area,seems to be getting bigger.She also has some pain in right shoulder at times. PCP is Dr Hilma Favors. She sees Dr Carloyn Manner for her back, and Dr Francesco Runner for pain management.   Current Medications, Allergies, Past Medical History, Past Surgical History, Family History and Social History were reviewed in [employer] record.     Review of Systems: Patient denies any headaches, hearing loss, fatigue, blurred vision, shortness of breath, chest pain, abdominal pain, problems with bowel movements, urination, or intercourse(not having sex). No mood swings.See HPI for positives.    Physical Exam:BP 140/80 mmHg  Pulse 102  Ht 5\' 1"  (1.549 m)  Wt 173 lb 8 oz (78.699 kg)  BMI 32.80 kg/m2 General:  Well developed, well nourished, no acute distress Skin:  Warm and dry Neck:  Midline trachea, normal thyroid, good ROM, no lymphadenopathy Lungs; Clear to auscultation bilaterally Breast:  No dominant palpable mass, retraction, or nipple discharge on right, left breast is absent. Cardiovascular: Regular rate and rhythm Abdomen:  Soft, non tender, no hepatosplenomegaly Pelvic:  External genitalia is normal in appearance, no lesions.  The vagina has decreased color, moisture and rugae. Urethra has no lesions or masses. The cervix is bulbous and smooth.  Uterus is felt to be normal size, shape, and contour.  No adnexal masses or tenderness noted.Bladder is non tender, no masses felt. Rectal: Good sphincter tone, no polyps, internal  hemorrhoids felt.  Hemoccult negative. Has pea sized firm tender nodule on left near rectal opening,Dr Eure in for co exam,?sebaceous cyst/comedome  Extremities/musculoskeletal:   No swelling or varicosities noted, no clubbing or cyanosis,has pain in right shoulder with movement Psych:  No mood changes, alert and cooperative,seems happy She has gotten flu shot.  Impression:  Well woman gyn exam no pap Sebaceous cyst Right shoulder pain    Plan: Rx septra ds 1 bid x 14 days #28 Follow up in 2 weeks, may need to I&D cyst Pap and physical in 1 year Mammogram yearly Colonoscopy per Dr Marcine Matar with PCP

## 2015-01-04 NOTE — Patient Instructions (Addendum)
Take septra ds 1 bid x 14 days Follow up in 2 weeks Pap and physical in 1 year Mammogram yearly Colonoscopy per Dr Marcine Matar with PCP  Epidermal Cyst An epidermal cyst is sometimes called a sebaceous cyst, epidermal inclusion cyst, or infundibular cyst. These cysts usually contain a substance that looks "pasty" or "cheesy" and may have a bad smell. This substance is a protein called keratin. Epidermal cysts are usually found on the face, neck, or trunk. They may also occur in the vaginal area or other parts of the genitalia of both men and women. Epidermal cysts are usually small, painless, slow-growing bumps or lumps that move freely under the skin. It is important not to try to pop them. This may cause an infection and lead to tenderness and swelling. CAUSES  Epidermal cysts may be caused by a deep penetrating injury to the skin or a plugged hair follicle, often associated with acne. SYMPTOMS  Epidermal cysts can become inflamed and cause:  Redness.  Tenderness.  Increased temperature of the skin over the bumps or lumps.  Grayish-white, bad smelling material that drains from the bump or lump. DIAGNOSIS  Epidermal cysts are easily diagnosed by your caregiver during an exam. Rarely, a tissue sample (biopsy) may be taken to rule out other conditions that may resemble epidermal cysts. TREATMENT   Epidermal cysts often get better and disappear on their own. They are rarely ever cancerous.  If a cyst becomes infected, it may become inflamed and tender. This may require opening and draining the cyst. Treatment with antibiotics may be necessary. When the infection is gone, the cyst may be removed with minor surgery.  Small, inflamed cysts can often be treated with antibiotics or by injecting steroid medicines.  Sometimes, epidermal cysts become large and bothersome. If this happens, surgical removal in your caregiver's office may be necessary. HOME CARE INSTRUCTIONS  Only take  over-the-counter or prescription medicines as directed by your caregiver.  Take your antibiotics as directed. Finish them even if you start to feel better. SEEK MEDICAL CARE IF:   Your cyst becomes tender, red, or swollen.  Your condition is not improving or is getting worse.  You have any other questions or concerns. MAKE SURE YOU:  Understand these instructions.  Will watch your condition.  Will get help right away if you are not doing well or get worse.   This information is not intended to replace advice given to you by your health care provider. Make sure you discuss any questions you have with your health care provider.   Document Released: 01/04/2004 Document Revised: 04/27/2011 Document Reviewed: 08/11/2010 Elsevier Interactive Patient Education [employer].

## 2015-01-18 ENCOUNTER — Ambulatory Visit: Payer: Managed Care, Other (non HMO) | Admitting: Adult Health

## 2015-09-18 ENCOUNTER — Encounter (HOSPITAL_COMMUNITY): Payer: Managed Care, Other (non HMO) | Attending: Oncology | Admitting: Oncology

## 2015-09-18 ENCOUNTER — Encounter (HOSPITAL_COMMUNITY): Payer: Self-pay | Admitting: Hematology & Oncology

## 2015-09-18 DIAGNOSIS — R32 Unspecified urinary incontinence: Secondary | ICD-10-CM | POA: Insufficient documentation

## 2015-09-18 DIAGNOSIS — C50912 Malignant neoplasm of unspecified site of left female breast: Secondary | ICD-10-CM | POA: Diagnosis not present

## 2015-09-18 DIAGNOSIS — Z1239 Encounter for other screening for malignant neoplasm of breast: Secondary | ICD-10-CM

## 2015-09-18 LAB — URINE MICROSCOPIC-ADD ON: Squamous Epithelial / LPF: NONE SEEN

## 2015-09-18 LAB — URINALYSIS, ROUTINE W REFLEX MICROSCOPIC
Bilirubin Urine: NEGATIVE
GLUCOSE, UA: NEGATIVE mg/dL
Hgb urine dipstick: NEGATIVE
KETONES UR: NEGATIVE mg/dL
Nitrite: NEGATIVE
Protein, ur: NEGATIVE mg/dL
Specific Gravity, Urine: 1.01 (ref 1.005–1.030)
pH: 5.5 (ref 5.0–8.0)

## 2015-09-18 NOTE — Patient Instructions (Signed)
[place] at [facility] Discharge Instructions  RECOMMENDATIONS MADE BY THE CONSULTANT AND ANY TEST RESULTS WILL BE SENT TO YOUR REFERRING PHYSICIAN.  We will get you set-up for a mammogram. We will perform a urine test today to look for an infection.  If no infection, we will refer you to a urologist. Return next year for follow-up.  Please call with any questions or concerns.  Thank you for choosing [place] at [facility] to provide your oncology and hematology care.  To afford each patient quality time with our provider, please arrive at least 15 minutes before your scheduled appointment time.   Beginning January 23rd 2017 lab work for the [facility] will be done in the  Main lab at [facility] on 1st floor. If you have a lab appointment with the [place] please come in thru the  Main Entrance and check in at the main information desk  You need to re-schedule your appointment should you arrive 10 or more minutes late.  We strive to give you quality time with our providers, and arriving late affects you and other patients whose appointments are after yours.  Also, if you no show three or more times for appointments you may be dismissed from the clinic at the providers discretion.     Again, thank you for choosing [facility].  Our hope is that these requests will decrease the amount of time that you wait before being seen by our physicians.       _____________________________________________________________  Should you have questions after your visit to [facility], please contact our office at [phone] between the hours of 8:30 a.m. and 4:30 p.m.  Voicemails left after 4:30 p.m. will not be returned until the following business day.  For prescription refill requests, have your pharmacy contact our office.         Resources For Cancer Patients and their Caregivers ? American Cancer Society: Can  assist with transportation, wigs, general needs, runs Look Good Feel Better.        [phone] ? Cancer Care: Provides financial assistance, online support groups, medication/co-pay assistance.  1-800-813-HOPE (551)416-9113) ? [facility] [place] Assists [place] cancer patients and their families through emotional , educational and financial support.  416-094-3748 ? [place] DSS Where to apply for food stamps, Medicaid and utility assistance. 7734382796 ? [facility]: Transportation to medical appointments. (603)050-6213 ? Social Security Administration: May apply for disability if have a Stage IV cancer. 310-864-4103 239-836-4374 ? [employer], Disability and Transit Services: Assists with nutrition, care and transit needs. [place] Support Programs: @10RELATIVEDAYS @ > Cancer Support Group  2nd Tuesday of the month 1pm-2pm, Journey Room  > Creative Journey  3rd Tuesday of the month 1130am-1pm, Journey Room  > Look Good Feel Better  1st Wednesday of the month 10am-12 noon, Journey Room (Call [place] to register [phone])

## 2015-09-18 NOTE — Assessment & Plan Note (Addendum)
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  Left radical mastectomy by Dr. Jenkins on 02/25/2005.  S/P 5 cycles of FEC in a dose-dense fashion with the 6th cycle consisting of Methotrexate being substituted for Epirubicin due to skin toxicty.  Chemotherapy completed on 06/03/2005 with no evidence of recurrence since.  Staging in [facility] problem list is completed.  No role for labs today.  I personally reviewed and went over laboratory results with the patient.  The results are noted within this dictation.  I personally reviewed and went over radiographic studies with the patient.  The results are noted within this dictation.  She is overdue for her mammogram and therefore, mammogram order is placed and will be scheduled this month.  She reports urinary complaints consisting of urinary incontinence.  I will order a UA today.  If negative for UTI, I will refer the patient to Urology for further evaluation and management.  Of note, she does have one child that was birthed vaginally.  UA did not demonstrate frank UTI.  As a result, we will refer her to urology for further evaluation of this issue.    Return in 1 year for follow-up.  I will refer the patient into the survivorship program.  

## 2015-09-18 NOTE — Progress Notes (Signed)
[name]., MD [place] [address]  Infiltrating ductal carcinoma of breast, stage 1, left (HCC) - Plan: MM SCREENING BREAST TOMO UNI R  Screening breast examination - Plan: MM SCREENING BREAST TOMO UNI R  Urinary incontinence, unspecified incontinence type - Plan: Urinalysis, Routine w reflex microscopic, Urinalysis, Routine w reflex microscopic, CANCELED: Urinalysis, Routine w reflex microscopic  CURRENT THERAPY: Surveillance per NCCN guidelines  INTERVAL HISTORY: Claudia Murphy 60 y.o. 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  Left radical mastectomy by Dr. Arnoldo Morale on 02/25/2005.  S/P 5 cycles of FEC in a dose-dense fashion with the 6th cycle consisting of Methotrexate being substituted for Epirubicin due to skin toxicty.  Chemotherapy completed on 06/03/2005 with no evidence of recurrence since.  She reports urinary incontinence that is not related to intra-abdominal pressure increases (sneezing, coughing, vasalva, etc).  She notes that multiple times throughout the day, she wets herself without enough warning to make it to the bathroom in a timely fashion.  She denies any urinary pain or burning.  She does note some left flank pain when she tries to hold her urine.  Review of Systems  Constitutional: Negative for chills, fever and weight loss.  HENT: Negative.   Eyes: Negative.   Respiratory: Negative.   Cardiovascular: Negative.   Gastrointestinal: Negative.   Genitourinary: Positive for flank pain (left) and urgency. Negative for dysuria, frequency and hematuria.  Skin: Negative.   Neurological: Negative.  Negative for weakness.  Endo/Heme/Allergies: Negative.   Psychiatric/Behavioral: Negative.     Past Medical History:  Diagnosis Date  . Anxiety   . Arthritis    "all over my body" (01/25/2012)  . Breast cancer ([place])    "left" (01/25/2012)  .  Breast disorder    breast left  . Carpal tunnel syndrome on both sides   . Cataract mature, total senile    "both" (01/25/2012)  . Chronic lower back pain   . DJD (degenerative joint disease)    "neck, shoulders, back" (01/25/2012)  . GERD (gastroesophageal reflux disease)   . Glaucoma   . History of stomach ulcers ` 1990  . 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 Left knee DJD   . Osteopenia   . Osteoporosis   . Osteoporosis   . Pain of right shoulder joint on movement 01/04/2015  . Peripheral neuropathy ([place])    "not diabetic" (01/25/2012)  . Pneumonia 1978   "when my son was born" (01/25/2012)  . PONV (postoperative nausea and vomiting)   . Sebaceous cyst 01/04/2015    Past Surgical History:  Procedure Laterality Date  . BREAST BIOPSY  2006   "left" (01/25/2012)  . FOOT SURGERY    . INJECTION KNEE  2013   lt  . KNEE ARTHROSCOPY  1980's   "left" (01/25/2012)  . MASTECTOMY  01/07   left  . PARTIAL KNEE ARTHROPLASTY  01/25/2012   Procedure: UNICOMPARTMENTAL KNEE;  Surgeon: Lorn Junes, MD;  Location: [facility];  Service: Orthopedics;  Laterality: Left;  LEFT KNEE UNICOMPARTMENTAL ARTHROPLASTY   . PERIPHERALLY INSERTED CENTRAL CATHETER INSERTION  2007  . PLANTAR FASCIA SURGERY  11/25/2010   "left" (01/25/2012)  . REPLACEMENT UNICONDYLAR JOINT KNEE  01/25/2012   "left" (01/25/2012)  . THROMBECTOMY / EMBOLECTOMY SUBCLAVIAN ARTERY  2007   "post PICC line removal" (01/25/2012)  . TUBAL LIGATION  1970's  Family History  Problem Relation Age of Onset  . Diabetes Mother   . Hypertension Mother   . Heart disease Father   . Hypertension Father   . Diabetes Son   . Diabetes Sister   . Thyroid disease Sister   . Diabetes Sister   . Diabetes Sister   . Diabetes Sister   . COPD Brother   . Hypertension Brother     Social History   Social History  . Marital status: Married    Spouse name: N/A  . Number of children: N/A  . Years of education: N/A    Social History Main Topics  . Smoking status: Former Smoker    Packs/day: 1.50    Years: 33.00    Types: Cigarettes    Quit date: 11/16/2004  . Smokeless tobacco: Never Used  . Alcohol use No  . Drug use: No  . Sexual activity: Not Currently    Birth control/ protection: Post-menopausal   Other Topics Concern  . None   Social History Narrative  . None     PHYSICAL EXAMINATION  ECOG PERFORMANCE STATUS: 1 - Symptomatic but completely ambulatory  There were no vitals filed for this visit.  BP 160/77 P 82 R 16 O2 100% RA  GENERAL:alert, no distress, well nourished, well developed, comfortable, cooperative, obese, smiling and unaccompanied SKIN: skin color, texture, turgor are normal, no rashes or significant lesions HEAD: Normocephalic, No masses, lesions, tenderness or abnormalities EYES: normal, EOMI, Conjunctiva are pink and non-injected EARS: External ears normal OROPHARYNX:lips, buccal mucosa, and tongue normal and mucous membranes are moist  NECK: supple, no adenopathy, thyroid normal size, non-tender, without nodularity, trachea midline LYMPH:  no palpable lymphadenopathy, no hepatosplenomegaly BREAST:right breast normal without mass, skin or nipple changes or axillary nodes, left post-mastectomy site well healed and free of suspicious changes LUNGS: clear to auscultation and percussion HEART: regular rate & rhythm, no murmurs, no gallops, S1 normal and S2 normal ABDOMEN:abdomen soft, non-tender, obese, normal bowel sounds and no masses or organomegaly BACK: Back symmetric, no curvature. EXTREMITIES:less then 2 second capillary refill, no joint deformities, effusion, or inflammation, no skin discoloration, no cyanosis  NEURO: alert & oriented x 3 with fluent speech, no focal motor/sensory deficits, gait normal   LABORATORY DATA: CBC    Component Value Date/Time   WBC 9.7 09/18/2014 0904   RBC 4.25 09/18/2014 0904   HGB 12.3 09/18/2014 0904   HCT 38.2  09/18/2014 0904   PLT 319 09/18/2014 0904   MCV 89.9 09/18/2014 0904   MCH 28.9 09/18/2014 0904   MCHC 32.2 09/18/2014 0904   RDW 12.6 09/18/2014 0904   LYMPHSABS 2.5 09/18/2014 0904   MONOABS 0.6 09/18/2014 0904   EOSABS 0.2 09/18/2014 0904   BASOSABS 0.1 09/18/2014 0904      Chemistry      Component Value Date/Time   NA 139 09/18/2014 0904   K 3.3 (L) 09/18/2014 0904   CL 102 09/18/2014 0904   CO2 26 09/18/2014 0904   BUN 17 09/18/2014 0904   CREATININE 0.53 09/18/2014 0904   CREATININE 0.61 09/28/2012 1155      Component Value Date/Time   CALCIUM 9.2 09/18/2014 0904   ALKPHOS 73 09/18/2014 0904   AST 25 09/18/2014 0904   ALT 18 09/18/2014 0904   BILITOT 0.4 09/18/2014 0904        PENDING LABS:   RADIOGRAPHIC STUDIES:  No results found.   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  Left radical mastectomy by Dr. Arnoldo Morale on 02/25/2005.  S/P 5 cycles of FEC in a dose-dense fashion with the 6th cycle consisting of Methotrexate being substituted for Epirubicin due to skin toxicty.  Chemotherapy completed on 06/03/2005 with no evidence of recurrence since.  Staging in [facility] problem list is completed.  No role for labs today.  I personally reviewed and went over laboratory results with the patient.  The results are noted within this dictation.  I personally reviewed and went over radiographic studies with the patient.  The results are noted within this dictation.  She is overdue for her mammogram and therefore, mammogram order is placed and will be scheduled this month.  She reports urinary complaints consisting of urinary incontinence.  I will order a UA today.  If negative for UTI, I will refer the patient to Urology for further evaluation and management.  Of note, she does have one child that was birthed vaginally.     Return in 1 year for follow-up.  I will refer the patient into the survivorship program.    ORDERS PLACED FOR THIS ENCOUNTER: Orders Placed This Encounter  Procedures  . MM SCREENING BREAST TOMO UNI R  . Urinalysis, Routine w reflex microscopic  . Urine microscopic-add on    MEDICATIONS PRESCRIBED THIS ENCOUNTER: No orders of the defined types were placed in this encounter.   THERAPY PLAN:  NCCN guidelines recommends the following surveillance for invasive breast cancer (2.2017):  A. History and Physical exam 1-4 times per year as clinically appropriate for 5 years, then annually.  B. Periodic screening for changes in family history and referral to genetics counseling as indicated  C. Educate, monitor, and refer to lymphedema management.  D. Mammography every 12 months  E. Routine imaging of reconstructed breast is not indicated.  F. In the absence of clinical signs and symptoms suggestive of recurrent disease, there is no indication for laboratory or imaging studies for metastases screening.  G. Women on Tamoxifen: annual gynecologic assessment every 12 months if uterus is present.  H. Women on aromatase inhibitor or who experience ovarian failure secondary to treatment should have monitoring of bone health with a bone mineral density determination at baseline and periodically thereafter.  I. Assess and encourage adherence to adjuvant endocrine therapy.  J. Evidence suggests that active lifestyle, healthy diet, limited alcohol intake, and achieving and maintaining an ideal body weight (20-25 BMI) may lead to optimal breast cancer outcomes.   All questions were answered. The patient knows to call the clinic with any problems, questions or concerns. We can certainly see the patient much sooner if necessary.  Patient and plan discussed with Dr. Ancil Linsey and she is in agreement with the aforementioned.   This note is electronically signed by: Doy Mince 09/18/2015 7:26 PM

## 2015-09-19 ENCOUNTER — Encounter (HOSPITAL_COMMUNITY): Payer: Self-pay | Admitting: Lab

## 2015-09-19 NOTE — Progress Notes (Unsigned)
Referral to [facility].  Records faxed on 8/3.  They will contact office.

## 2015-09-23 ENCOUNTER — Encounter (HOSPITAL_COMMUNITY): Payer: Self-pay | Admitting: Emergency Medicine

## 2015-09-23 ENCOUNTER — Other Ambulatory Visit (HOSPITAL_COMMUNITY): Payer: Self-pay | Admitting: Emergency Medicine

## 2015-09-23 NOTE — Progress Notes (Unsigned)
She wants to wait on the survivorship clinic at the time.  Maybe at a later time, she will let us know.

## 2015-09-23 NOTE — Progress Notes (Signed)
Left another message for pt to call me back today, so I can talk to her about getting referred into survivorship clinic.

## 2015-09-26 ENCOUNTER — Ambulatory Visit (HOSPITAL_COMMUNITY): Payer: Self-pay

## 2015-10-04 ENCOUNTER — Ambulatory Visit (HOSPITAL_COMMUNITY)
Admission: RE | Admit: 2015-10-04 | Discharge: 2015-10-04 | Disposition: A | Discharge status: Home / Self Care | Payer: Managed Care, Other (non HMO) | Source: Ambulatory Visit | Attending: Hematology & Oncology | Admitting: Hematology & Oncology

## 2015-10-04 DIAGNOSIS — C50912 Malignant neoplasm of unspecified site of left female breast: Secondary | ICD-10-CM | POA: Diagnosis not present

## 2015-10-04 DIAGNOSIS — Z1239 Encounter for other screening for malignant neoplasm of breast: Secondary | ICD-10-CM | POA: Diagnosis not present

## 2015-10-04 DIAGNOSIS — Z1231 Encounter for screening mammogram for malignant neoplasm of breast: Secondary | ICD-10-CM | POA: Diagnosis present

## 2015-10-08 ENCOUNTER — Ambulatory Visit (HOSPITAL_COMMUNITY)
Admission: RE | Admit: 2015-10-08 | Discharge: 2015-10-08 | Disposition: A | Discharge status: Home / Self Care | Payer: Managed Care, Other (non HMO) | Source: Ambulatory Visit | Attending: Family Medicine | Admitting: Family Medicine

## 2015-10-08 ENCOUNTER — Other Ambulatory Visit (HOSPITAL_COMMUNITY): Payer: Self-pay | Admitting: Family Medicine

## 2015-10-08 DIAGNOSIS — M5136 Other intervertebral disc degeneration, lumbar region: Secondary | ICD-10-CM | POA: Insufficient documentation

## 2015-10-08 DIAGNOSIS — M47816 Spondylosis without myelopathy or radiculopathy, lumbar region: Secondary | ICD-10-CM | POA: Diagnosis not present

## 2015-10-08 DIAGNOSIS — M47896 Other spondylosis, lumbar region: Secondary | ICD-10-CM

## 2015-11-15 ENCOUNTER — Ambulatory Visit (INDEPENDENT_AMBULATORY_CARE_PROVIDER_SITE_OTHER): Payer: Managed Care, Other (non HMO) | Admitting: Urology

## 2015-11-15 DIAGNOSIS — M549 Dorsalgia, unspecified: Secondary | ICD-10-CM | POA: Diagnosis not present

## 2015-11-15 DIAGNOSIS — N3941 Urge incontinence: Secondary | ICD-10-CM | POA: Diagnosis not present

## 2015-12-25 ENCOUNTER — Ambulatory Visit: Payer: Self-pay | Admitting: Urology

## 2016-02-20 ENCOUNTER — Other Ambulatory Visit: Payer: Self-pay | Admitting: Orthopedic Surgery

## 2016-02-20 DIAGNOSIS — M25562 Pain in left knee: Secondary | ICD-10-CM

## 2016-02-27 ENCOUNTER — Ambulatory Visit
Admission: RE | Admit: 2016-02-27 | Discharge: 2016-02-27 | Disposition: A | Discharge status: Home / Self Care | Payer: Managed Care, Other (non HMO) | Source: Ambulatory Visit | Attending: Orthopedic Surgery | Admitting: Orthopedic Surgery

## 2016-02-27 DIAGNOSIS — M25562 Pain in left knee: Secondary | ICD-10-CM

## 2016-08-07 IMAGING — CR DG LUMBAR SPINE 2-3V
2 series · 2 of 2 positions shown · non-contrast
Comparison: SI joint series and December 26, 2012 and MRI of the
lumbar spine April 29, 2012.

CLINICAL DATA: Low back pain history spondylosis

EXAM:
LUMBAR SPINE - 2-3 VIEW

[view not recorded (1 of 2)]
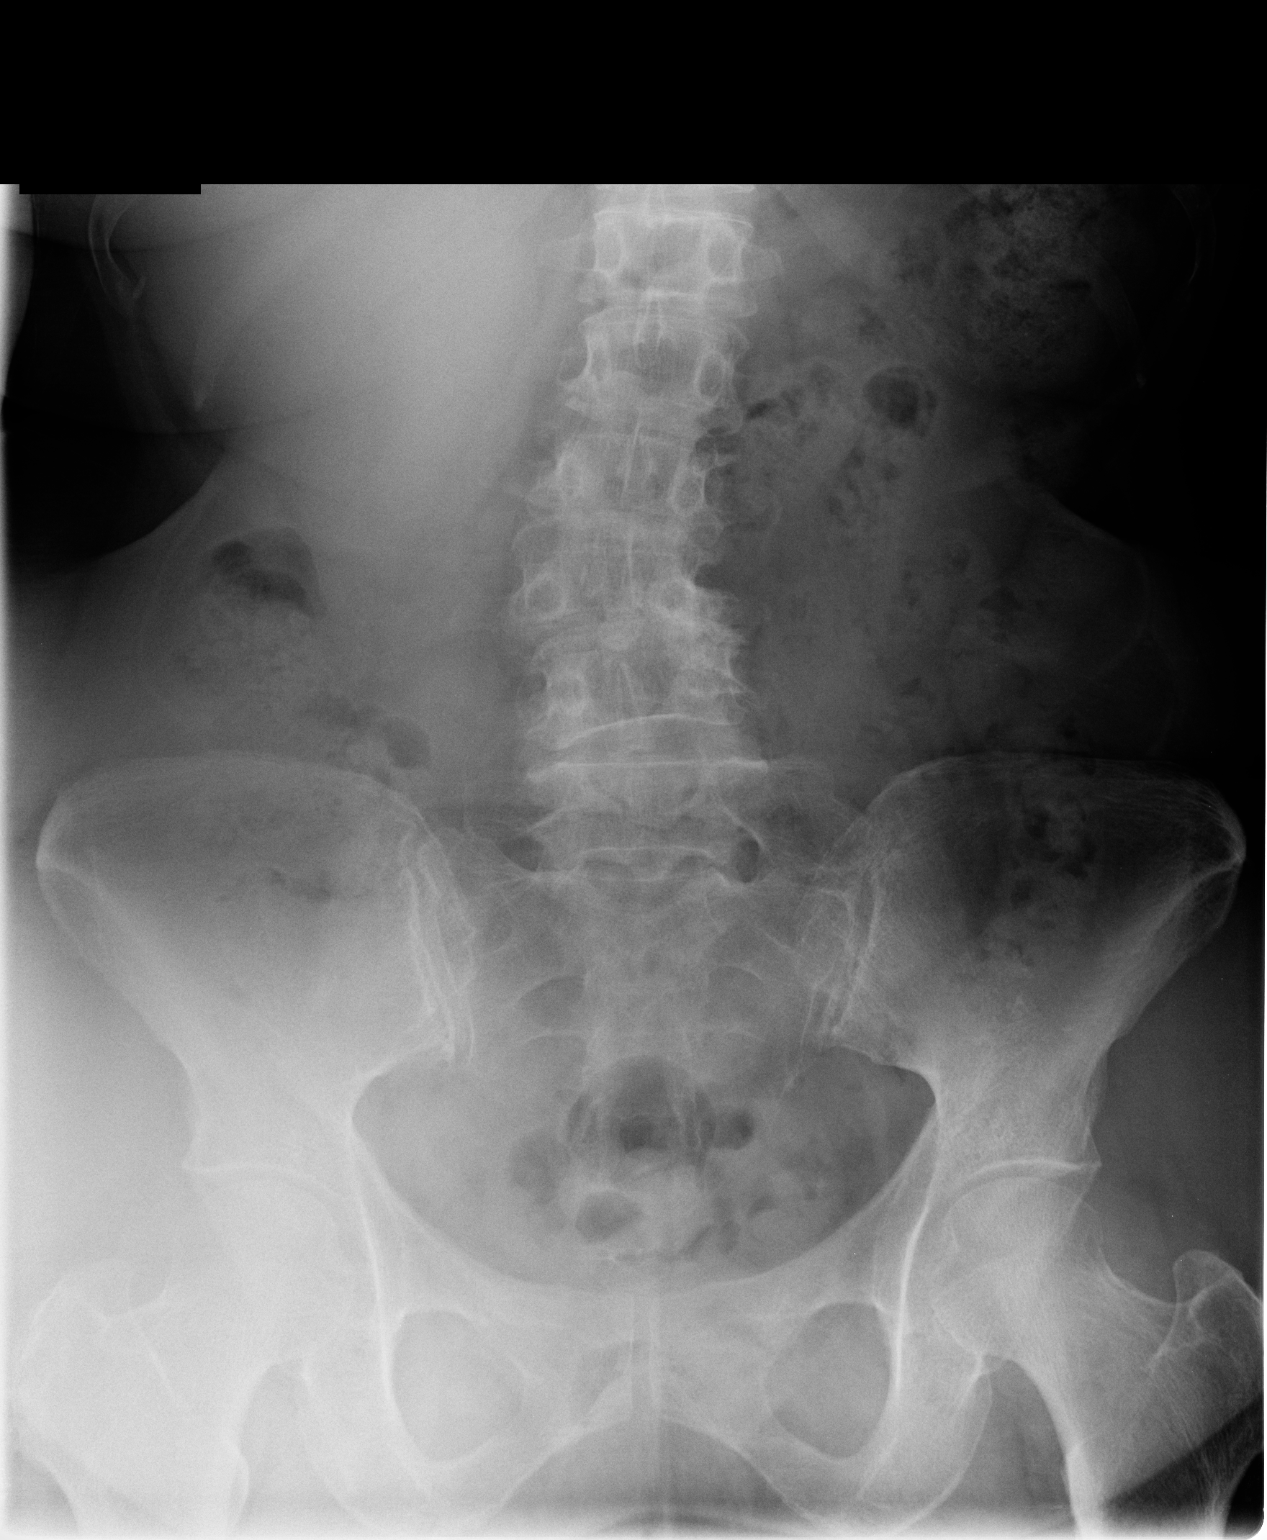

[view not recorded (2 of 2)]
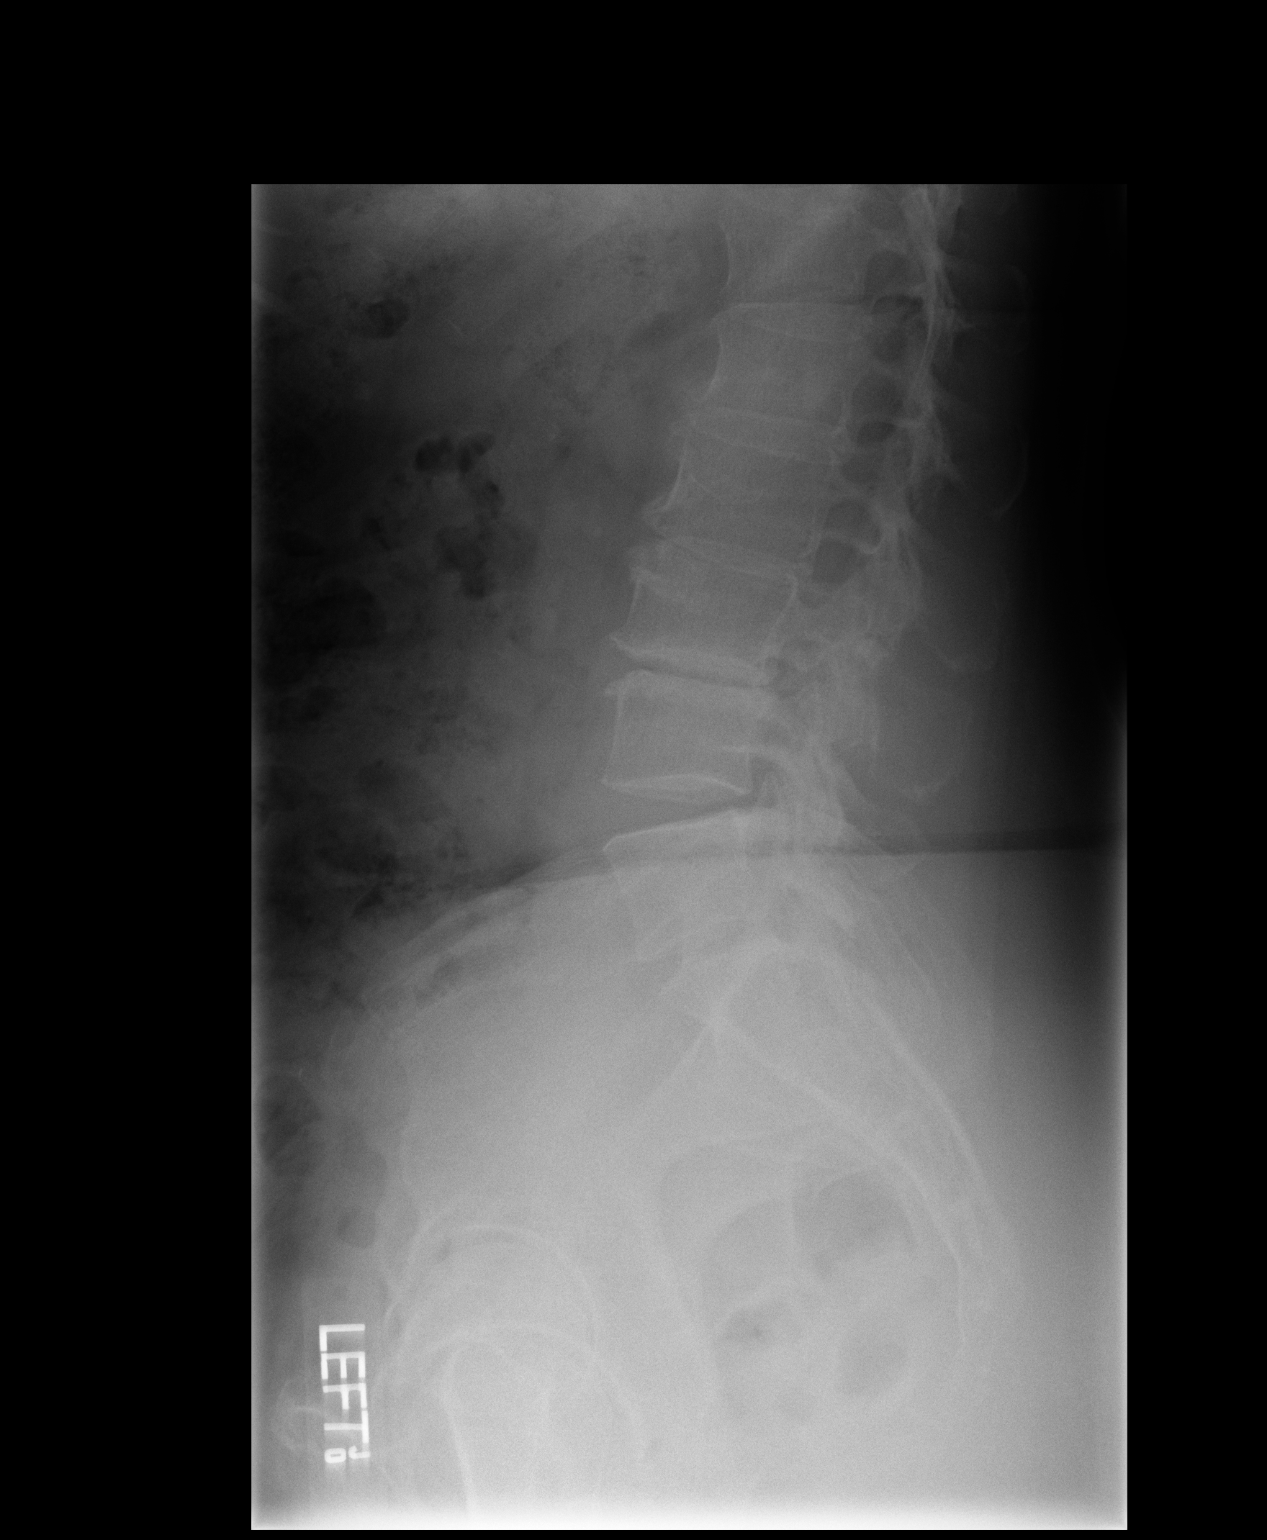

[2 of 2 positions shown; findings below may reference images not displayed]

FINDINGS: The lowermost vertebral segment is transitional. This is labeled S1
in agreement with previous imaging. There is a pseudoarthrosis on
the left at this level. There is mid thoracic dextro curvature of
the lumbar spine which is centered at L4 with the degree of
angulation of approximately 20 degrees. The pedicles and transverse
processes are intact. There is disc space narrowing at L3-4 and at
L4-5. There is no spondylolisthesis. There are anterior endplate
osteophytes from L2 through L5. There is mild facet joint
hypertrophy in the lower lumbar spine.

The alignment of the superior margins of the iliac crests is normal.
IMPRESSION: There isdextrocurvature of the mid to lower lumbar spine. There is
degenerative disc and facet joint change. There is no compression
fracture. S1 is transitional.

## 2016-08-07 IMAGING — MR MR LUMBAR SPINE W/O CM
4 of 5 series · 25 of 48 positions shown · non-contrast
Comparison: None.

CLINICAL DATA: Severe back pain, radiating to the legs

EXAM:
MRI LUMBAR SPINE WITHOUT CONTRAST
TECHNIQUE: Multiplanar, multisequence MR imaging of the lumbar spine was
performed. No intravenous contrast was administered.

[Series 3: T1 · sagittal · 4.0mm · 0.55mm/px · 7 of 15 slices shown (1 of 2)]
[im 1/15]
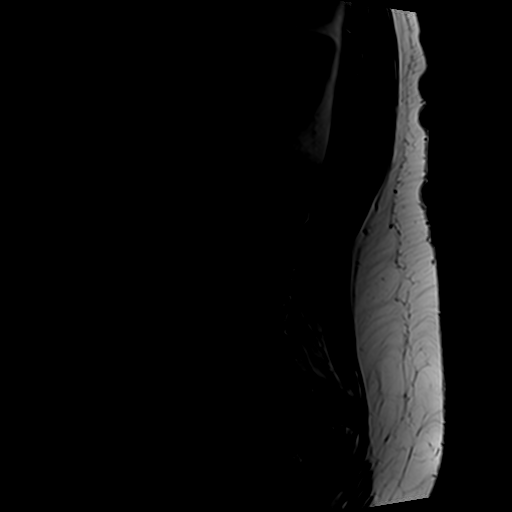
[im 3/15]
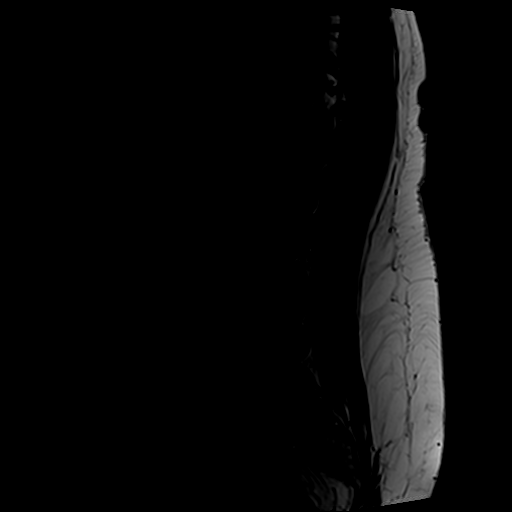
[im 5/15]
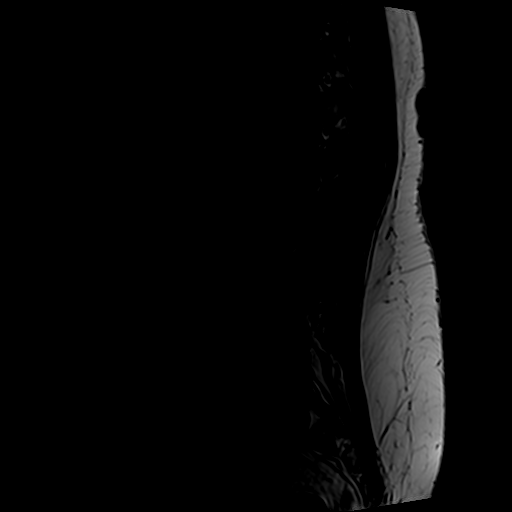
[im 8/15]
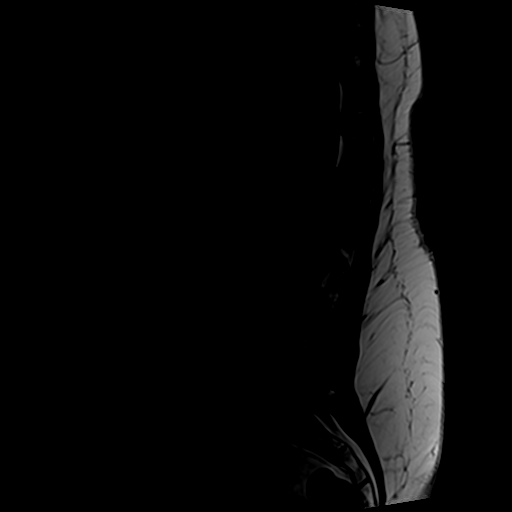
[im 10/15]
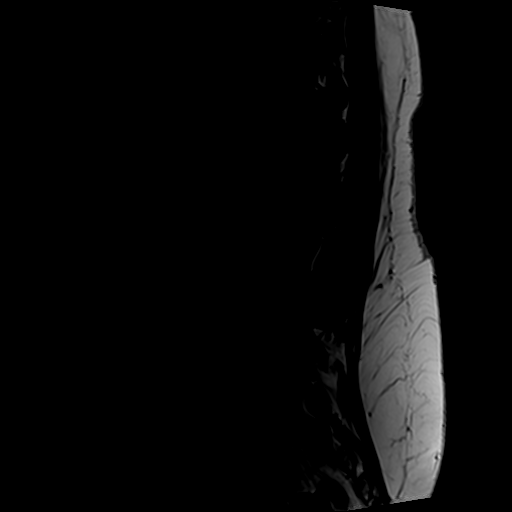
[im 12/15]
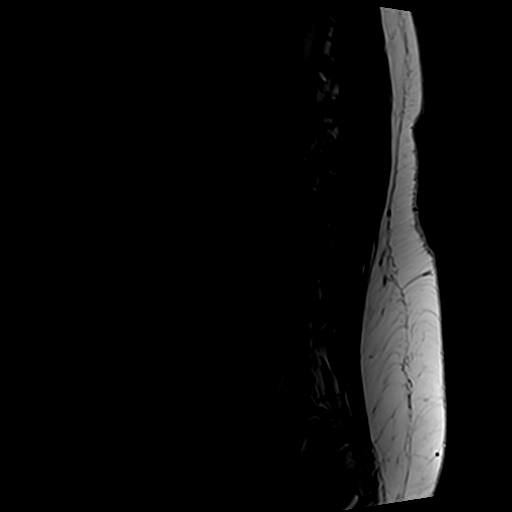
[im 15/15]
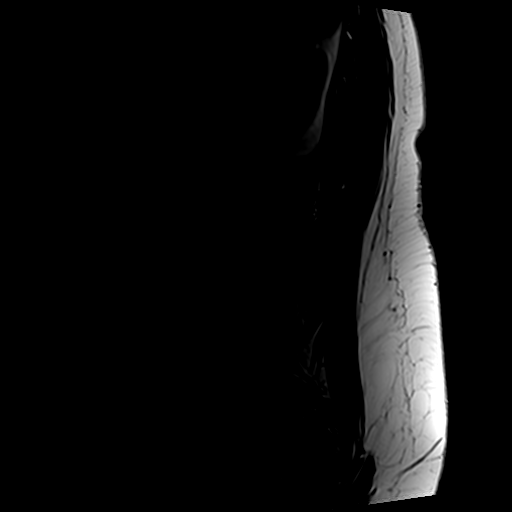

[Series 4: T2 post-contrast · sagittal · 4.0mm · 0.55mm/px · 6 of 15 slices shown]
[im 1/15]
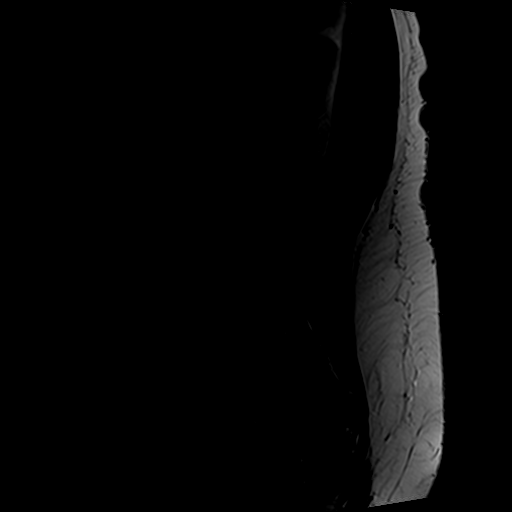
[im 3/15]
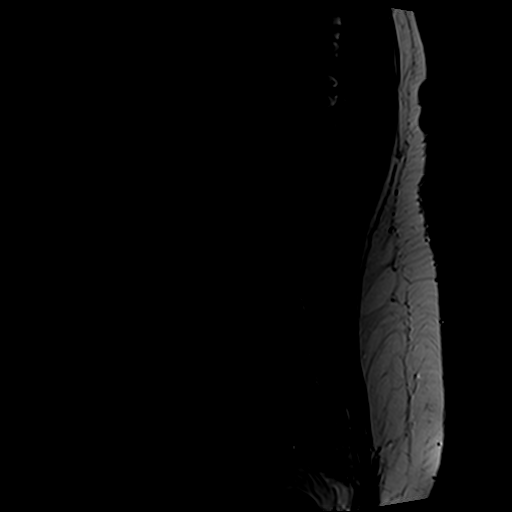
[im 6/15]
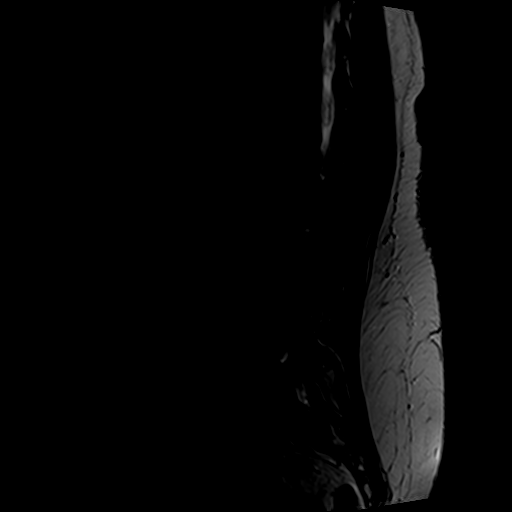
[im 9/15]
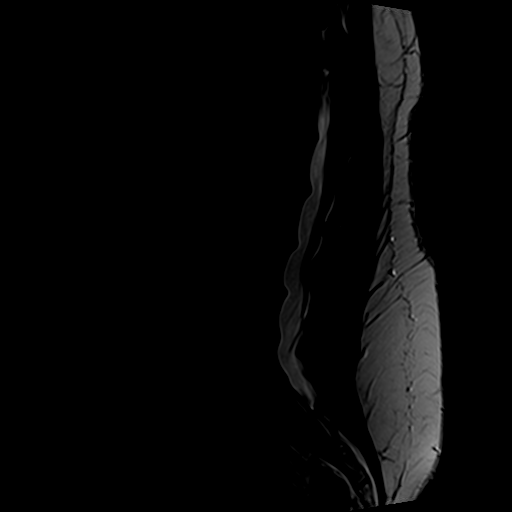
[im 12/15]
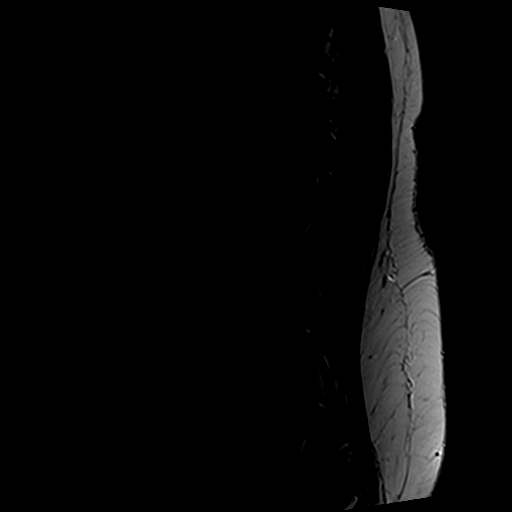
[im 15/15]
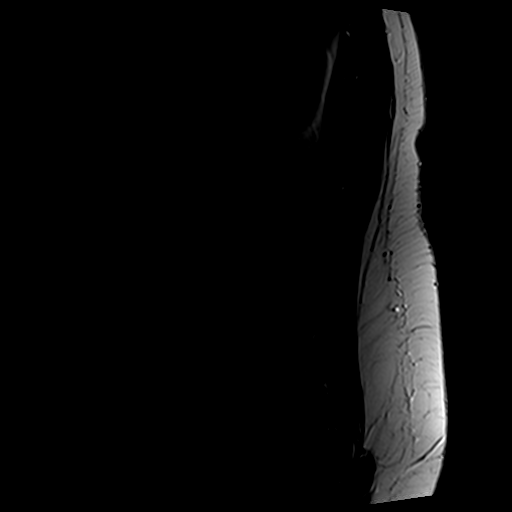

[Series 5: T2 · axial · 4.0mm · 0.70mm/px · z∈[-72,+108]mm · 8 of 33 slices shown]
[im 1/33]
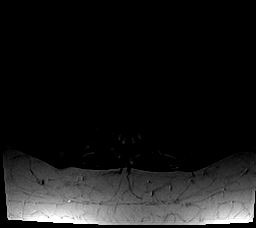
[im 5/33]
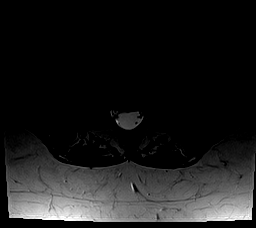
[im 10/33]
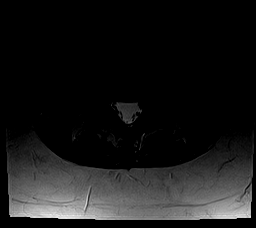
[im 15/33]
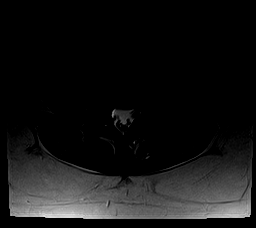
[im 18/33]
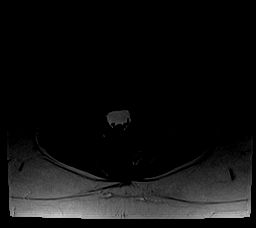
[im 23/33]
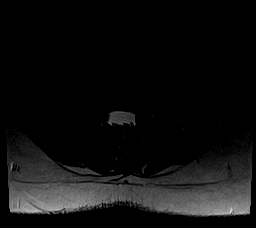
[im 28/33]
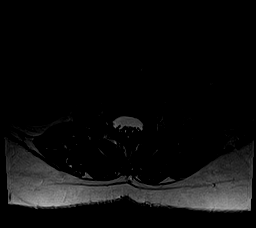
[im 33/33]
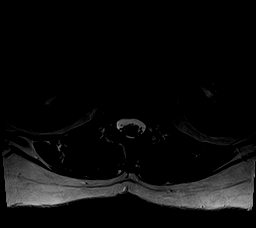

[Series 6: T1 · axial · 4.0mm · 0.35mm/px · z∈[-72,+83]mm · 4 of 33 slices shown (2 of 2)]
[im 1/33]
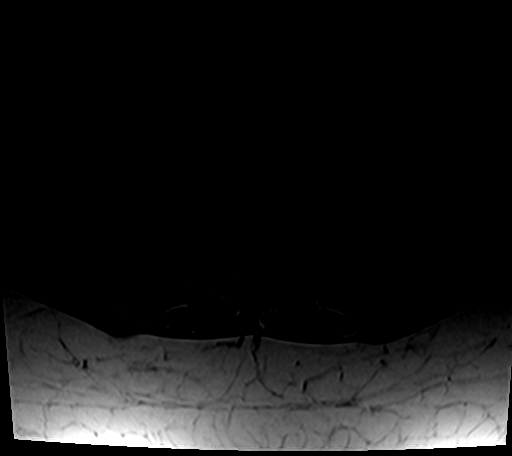
[im 5/33]
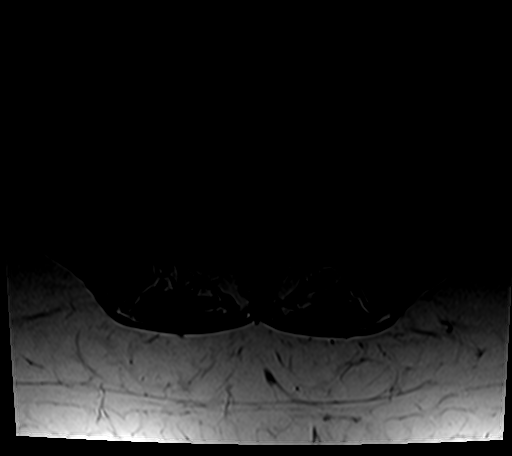
[im 18/33]
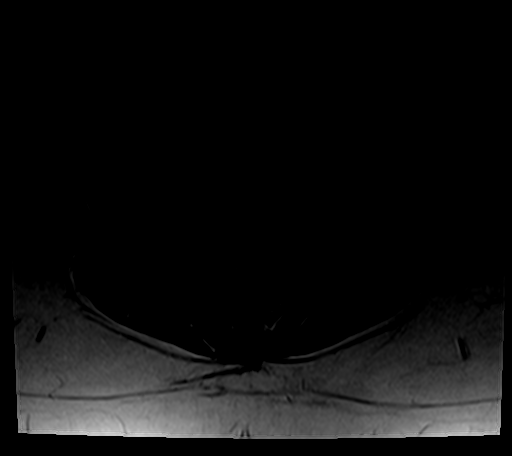
[im 28/33]
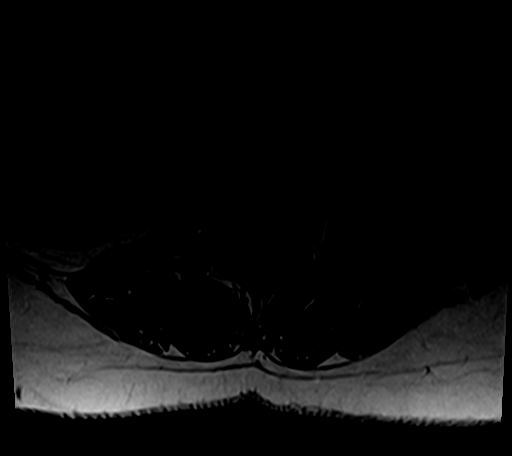

[25 of 48 positions shown; findings below may reference images not displayed]

FINDINGS: The vertebral bodies of the lumbar spine are normal in size. The
vertebral bodies of the lumbar spine are normal in alignment. There
is normal bone marrow signal demonstrated throughout the vertebra.
There is degenerative disc disease at L3-4.

The spinal cord is normal in signal and contour. The cord terminates
normally at L1 . The nerve roots of the cauda equina and the filum
terminale are normal.

The visualized portions of the SI joints are unremarkable.

The imaged intra-abdominal contents are unremarkable.

T12-L1: Mild broad-based disc bulge. No evidence of neural foraminal
stenosis. No central canal stenosis.

L1-L2: Mild broad-based disc bulge. No evidence of neural foraminal
stenosis. No central canal stenosis.

L2-L3: Mild broad-based disc bulge. No evidence of neural foraminal
stenosis. No central canal stenosis.

L3-L4: Mild broad-based disc bulging eccentric towards left. Severe
left facet arthropathy. Moderate left foraminal stenosis. No right
foraminal stenosis. No central canal stenosis.

L4-L5: Mild broad-based disc bulge eccentric towards the right.
Moderate right foraminal stenosis. No left foraminal stenosis. Mild
bilateral facet arthropathy. No central canal stenosis.

L5-S1: No significant disc bulge. No evidence of neural foraminal
stenosis. No central canal stenosis.
IMPRESSION: 1. At L3-4 there is a mild broad-based disc bulge eccentric towards
the left. Severe left facet arthropathy. Moderate left foraminal
stenosis.
2. At L4-5 there is a mild broad-based disc bulge eccentric towards
the right. There is moderate right foraminal stenosis.

## 2016-08-14 ENCOUNTER — Telehealth: Payer: Self-pay

## 2016-08-14 ENCOUNTER — Other Ambulatory Visit (HOSPITAL_COMMUNITY): Payer: Self-pay | Admitting: Internal Medicine

## 2016-08-14 ENCOUNTER — Ambulatory Visit (HOSPITAL_COMMUNITY)
Admission: RE | Admit: 2016-08-14 | Discharge: 2016-08-14 | Disposition: A | Discharge status: Home / Self Care | Payer: Managed Care, Other (non HMO) | Source: Ambulatory Visit | Attending: Internal Medicine | Admitting: Internal Medicine

## 2016-08-14 DIAGNOSIS — M50322 Other cervical disc degeneration at C5-C6 level: Secondary | ICD-10-CM | POA: Insufficient documentation

## 2016-08-14 DIAGNOSIS — M1288 Other specific arthropathies, not elsewhere classified, other specified site: Secondary | ICD-10-CM | POA: Insufficient documentation

## 2016-08-14 DIAGNOSIS — M5412 Radiculopathy, cervical region: Secondary | ICD-10-CM

## 2016-08-14 NOTE — Telephone Encounter (Signed)
Claudia Murphy walked into the office today stating that Dr. Hilma Favors told her to come by and schedule a stress test. Unable to locate an order. Told patient that we would check into this next week and give her call.

## 2016-08-25 NOTE — Telephone Encounter (Signed)
Checking on the referral.

## 2016-09-14 ENCOUNTER — Other Ambulatory Visit: Payer: Self-pay | Admitting: Adult Health

## 2016-09-14 DIAGNOSIS — Z1231 Encounter for screening mammogram for malignant neoplasm of breast: Secondary | ICD-10-CM

## 2016-09-25 ENCOUNTER — Encounter (HOSPITAL_COMMUNITY): Payer: Self-pay | Admitting: Oncology

## 2016-09-25 ENCOUNTER — Encounter (HOSPITAL_COMMUNITY): Payer: Managed Care, Other (non HMO) | Attending: Oncology | Admitting: Oncology

## 2016-09-25 VITALS — BP 144/84 | Resp 16 | Ht 61.0 in | Wt 178.9 lb

## 2016-09-25 DIAGNOSIS — C50912 Malignant neoplasm of unspecified site of left female breast: Secondary | ICD-10-CM

## 2016-09-25 DIAGNOSIS — Z1239 Encounter for other screening for malignant neoplasm of breast: Secondary | ICD-10-CM

## 2016-09-25 DIAGNOSIS — Z853 Personal history of malignant neoplasm of breast: Secondary | ICD-10-CM | POA: Diagnosis not present

## 2016-09-25 NOTE — Progress Notes (Signed)
[employer], [place] [address]  No diagnosis found.  CURRENT THERAPY: Surveillance per NCCN guidelines  INTERVAL HISTORY: Claudia Murphy 61 y.o. 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  Left radical mastectomy by Dr. Arnoldo Morale on 02/25/2005.  S/P 5 cycles of FEC in a dose-dense fashion with the 6th cycle consisting of Methotrexate being substituted for Epirubicin due to skin toxicty.  Chemotherapy completed on 06/03/2005 with no evidence of recurrence since. Patient is here for ongoing evaluation . has been scheduled for mammogram at the end of the month.  Patient is regularly seeing primary care physician. Did not have any interim complaints. Review of Systems  Constitutional: Negative for chills, fever and weight loss.  HENT: Negative.   Eyes: Negative.   Respiratory: Negative.   Cardiovascular: Negative.   Gastrointestinal: Negative.   Genitourinary: Positive for flank pain (left) and urgency. Negative for dysuria, frequency and hematuria.  Skin: Negative.   Neurological: Negative.  Negative for weakness.  Endo/Heme/Allergies: Negative.   Psychiatric/Behavioral: Negative.     Past Medical History:  Diagnosis Date  . Anxiety   . Arthritis    "all over my body" (01/25/2012)  . Breast cancer ([place])    "left" (01/25/2012)  . Breast disorder    breast left  . Carpal tunnel syndrome on both sides   . Cataract mature, total senile    "both" (01/25/2012)  . Chronic lower back pain   . DJD (degenerative joint disease)    "neck, shoulders, back" (01/25/2012)  . GERD (gastroesophageal reflux disease)   . Glaucoma   . History of stomach ulcers ` 1990  . 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 Left knee DJD   . Osteopenia   . Osteoporosis   . Osteoporosis   . Pain of right shoulder joint on movement 01/04/2015  . Peripheral  neuropathy ([place])    "not diabetic" (01/25/2012)  . Pneumonia 1978   "when my son was born" (01/25/2012)  . PONV (postoperative nausea and vomiting)   . Sebaceous cyst 01/04/2015    Past Surgical History:  Procedure Laterality Date  . BREAST BIOPSY  2006   "left" (01/25/2012)  . FOOT SURGERY    . INJECTION KNEE  2013   lt  . KNEE ARTHROSCOPY  1980's   "left" (01/25/2012)  . MASTECTOMY  01/07   left  . PARTIAL KNEE ARTHROPLASTY  01/25/2012   Procedure: UNICOMPARTMENTAL KNEE;  Surgeon: Lorn Junes, MD;  Location: [facility];  Service: Orthopedics;  Laterality: Left;  LEFT KNEE UNICOMPARTMENTAL ARTHROPLASTY   . PERIPHERALLY INSERTED CENTRAL CATHETER INSERTION  2007  . PLANTAR FASCIA SURGERY  11/25/2010   "left" (01/25/2012)  . REPLACEMENT UNICONDYLAR JOINT KNEE  01/25/2012   "left" (01/25/2012)  . THROMBECTOMY / EMBOLECTOMY SUBCLAVIAN ARTERY  2007   "post PICC line removal" (01/25/2012)  . TUBAL LIGATION  1970's    Family History  Problem Relation Age of Onset  . Diabetes Mother   . Hypertension Mother   . Heart disease Father   . Hypertension Father   . Diabetes Son   . Diabetes Sister   . Thyroid disease Sister   . Diabetes Sister   . Diabetes Sister   . Diabetes Sister   . COPD Brother   . Hypertension Brother     Social History   Social History  . Marital status:  Married    Spouse name: N/A  . Number of children: N/A  . Years of education: N/A   Social History Main Topics  . Smoking status: Former Smoker    Packs/day: 1.50    Years: 33.00    Types: Cigarettes    Quit date: 11/16/2004  . Smokeless tobacco: Never Used  . Alcohol use No  . Drug use: No  . Sexual activity: Not Currently    Birth control/ protection: Post-menopausal   Other Topics Concern  . Not on file   Social History Narrative  . No narrative on file     PHYSICAL EXAMINATION  ECOG PERFORMANCE STATUS: 1 - Symptomatic but completely ambulatory  There were no vitals filed for this visit.    BP 160/77 P 82 R 16 O2 100% RA  GENERAL:alert, no distress, well nourished, well developed, comfortable, cooperative, obese, smiling and unaccompanied SKIN: skin color, texture, turgor are normal, no rashes or significant lesions HEAD: Normocephalic, No masses, lesions, tenderness or abnormalities EYES: normal, EOMI, Conjunctiva are pink and non-injected EARS: External ears normal OROPHARYNX:lips, buccal mucosa, and tongue normal and mucous membranes are moist  NECK: supple, no adenopathy, thyroid normal size, non-tender, without nodularity, trachea midline LYMPH:  no palpable lymphadenopathy, no hepatosplenomegaly BREAST:right breast normal without mass, skin or nipple changes or axillary nodes, left post-mastectomy site well healed and free of suspicious changes LUNGS: clear to auscultation and percussion HEART: regular rate & rhythm, no murmurs, no gallops, S1 normal and S2 normal ABDOMEN:abdomen soft, non-tender, obese, normal bowel sounds and no masses or organomegaly BACK: Back symmetric, no curvature. EXTREMITIES:less then 2 second capillary refill, no joint deformities, effusion, or inflammation, no skin discoloration, no cyanosis  NEURO: alert & oriented x 3 with fluent speech, no focal motor/sensory deficits, gait normal   LABORATORY DATA: CBC    Component Value Date/Time   WBC 9.7 09/18/2014 0904   RBC 4.25 09/18/2014 0904   HGB 12.3 09/18/2014 0904   HCT 38.2 09/18/2014 0904   PLT 319 09/18/2014 0904   MCV 89.9 09/18/2014 0904   MCH 28.9 09/18/2014 0904   MCHC 32.2 09/18/2014 0904   RDW 12.6 09/18/2014 0904   LYMPHSABS 2.5 09/18/2014 0904   MONOABS 0.6 09/18/2014 0904   EOSABS 0.2 09/18/2014 0904   BASOSABS 0.1 09/18/2014 0904      Chemistry      Component Value Date/Time   NA 139 09/18/2014 0904   K 3.3 (L) 09/18/2014 0904   CL 102 09/18/2014 0904   CO2 26 09/18/2014 0904   BUN 17 09/18/2014 0904   CREATININE 0.53 09/18/2014 0904   CREATININE 0.61  09/28/2012 1155      Component Value Date/Time   CALCIUM 9.2 09/18/2014 0904   ALKPHOS 73 09/18/2014 0904   AST 25 09/18/2014 0904   ALT 18 09/18/2014 0904   BILITOT 0.4 09/18/2014 0904        PENDING LABS:   RADIOGRAPHIC STUDIES:  No results found.   PATHOLOGY:    ASSESSMENT AND PLAN:  Carcinoma of breast status post chemotherapy in 2007  Mammogram is being scheduled.  HCT is more than 10 years and there is no evidence of recurrent disease patient was discharged from our care with advice to get follow-up on a regular basis by primary care physician.  Patient was advised to get regular mammogram done ORDERS PLACED FOR THIS ENCOUNTER: No orders of the defined types were placed in this encounter.   MEDICATIONS PRESCRIBED THIS ENCOUNTER: No orders  of the defined types were placed in this encounter.   THERAPY PLAN:  NCCN guidelines recommends the following surveillance for invasive breast cancer (2.2017):  A. History and Physical exam 1-4 times per year as clinically appropriate for 5 years, then annually.  B. Periodic screening for changes in family history and referral to genetics counseling as indicated  C. Educate, monitor, and refer to lymphedema management.  D. Mammography every 12 months  E. Routine imaging of reconstructed breast is not indicated.  F. In the absence of clinical signs and symptoms suggestive of recurrent disease, there is no indication for laboratory or imaging studies for metastases screening.  G. Women on Tamoxifen: annual gynecologic assessment every 12 months if uterus is present.  H. Women on aromatase inhibitor or who experience ovarian failure secondary to treatment should have monitoring of bone health with a bone mineral density determination at baseline and periodically thereafter.  I. Assess and encourage adherence to adjuvant endocrine therapy.  J. Evidence suggests that active lifestyle, healthy diet, limited alcohol intake, and  achieving and maintaining an ideal body weight (20-25 BMI) may lead to optimal breast cancer outcomes.   All questions were answered. The patient knows to call the clinic with any problems, questions or concerns. We can certainly see the patient much sooner if necessary.  Patient and plan discussed with Dr. Ancil Linsey and she is in agreement with the aforementioned.   This note is electronically signed by: Forest Gleason, MD 09/25/2016 10:51 AM

## 2016-09-25 NOTE — Patient Instructions (Signed)
[facility] at [facility] Discharge Instructions  RECOMMENDATIONS MADE BY THE CONSULTANT AND ANY TEST RESULTS WILL BE SENT TO YOUR REFERRING PHYSICIAN.  You were seen today by Dr. Forest Gleason No need to make a follow up appointment you can continue to be followed by your primary care physician Call us in the future should you need continued care from [place].  Thank you for choosing [place] at [facility] to provide your oncology and hematology care.  To afford each patient quality time with our provider, please arrive at least 15 minutes before your scheduled appointment time.    If you have a lab appointment with the [facility] please come in thru the  Main Entrance and check in at the main information desk  You need to re-schedule your appointment should you arrive 10 or more minutes late.  We strive to give you quality time with our providers, and arriving late affects you and other patients whose appointments are after yours.  Also, if you no show three or more times for appointments you may be dismissed from the clinic at the providers discretion.     Again, thank you for choosing [facility].  Our hope is that these requests will decrease the amount of time that you wait before being seen by our physicians.       _____________________________________________________________  Should you have questions after your visit to [facility], please contact our office at [phone] between the hours of 8:30 a.m. and 4:30 p.m.  Voicemails left after 4:30 p.m. will not be returned until the following business day.  For prescription refill requests, have your pharmacy contact our office.       Resources For Cancer Patients and their Caregivers ? American Cancer Society: Can assist with transportation, wigs, general needs, runs Look Good Feel Better.        3144247688 ? Cancer Care: Provides financial assistance,  online support groups, medication/co-pay assistance.  1-800-813-HOPE 479-086-0052) ? [facility] Assists [place] cancer patients and their families through emotional , educational and financial support.  416-716-0712 ? [place] DSS Where to apply for food stamps, Medicaid and utility assistance. 409-674-5404 ? [facility]: Transportation to medical appointments. 418-238-2198 ? Social Security Administration: May apply for disability if have a Stage IV cancer. 781-800-5342 712-707-4002 ? [employer], Disability and Transit Services: Assists with nutrition, care and transit needs. [place] Support Programs: @10RELATIVEDAYS @ > Cancer Support Group  2nd Tuesday of the month 1pm-2pm, Journey Room  > Creative Journey  3rd Tuesday of the month 1130am-1pm, Journey Room  > Look Good Feel Better  1st Wednesday of the month 10am-12 noon, Journey Room (Call [employer] to register [phone])

## 2016-10-09 ENCOUNTER — Ambulatory Visit (HOSPITAL_COMMUNITY)
Admission: RE | Admit: 2016-10-09 | Discharge: 2016-10-09 | Disposition: A | Discharge status: Home / Self Care | Payer: Managed Care, Other (non HMO) | Source: Ambulatory Visit | Attending: Adult Health | Admitting: Adult Health

## 2016-10-09 DIAGNOSIS — Z1231 Encounter for screening mammogram for malignant neoplasm of breast: Secondary | ICD-10-CM | POA: Diagnosis present

## 2017-07-08 ENCOUNTER — Ambulatory Visit (INDEPENDENT_AMBULATORY_CARE_PROVIDER_SITE_OTHER): Payer: 59 | Admitting: Adult Health

## 2017-07-08 ENCOUNTER — Encounter (INDEPENDENT_AMBULATORY_CARE_PROVIDER_SITE_OTHER): Payer: Self-pay

## 2017-07-08 ENCOUNTER — Other Ambulatory Visit (HOSPITAL_COMMUNITY)
Admission: RE | Admit: 2017-07-08 | Discharge: 2017-07-08 | Disposition: A | Discharge status: Home / Self Care | Payer: 59 | Source: Ambulatory Visit | Attending: Adult Health | Admitting: Adult Health

## 2017-07-08 ENCOUNTER — Encounter: Payer: Self-pay | Admitting: Adult Health

## 2017-07-08 VITALS — BP 160/90 | HR 94 | Ht 60.5 in | Wt 178.0 lb

## 2017-07-08 DIAGNOSIS — Z853 Personal history of malignant neoplasm of breast: Secondary | ICD-10-CM | POA: Diagnosis not present

## 2017-07-08 DIAGNOSIS — I10 Essential (primary) hypertension: Secondary | ICD-10-CM | POA: Insufficient documentation

## 2017-07-08 DIAGNOSIS — Z01411 Encounter for gynecological examination (general) (routine) with abnormal findings: Secondary | ICD-10-CM

## 2017-07-08 DIAGNOSIS — N3946 Mixed incontinence: Secondary | ICD-10-CM | POA: Diagnosis not present

## 2017-07-08 DIAGNOSIS — Z1211 Encounter for screening for malignant neoplasm of colon: Secondary | ICD-10-CM | POA: Diagnosis not present

## 2017-07-08 DIAGNOSIS — Z01419 Encounter for gynecological examination (general) (routine) without abnormal findings: Secondary | ICD-10-CM | POA: Diagnosis present

## 2017-07-08 DIAGNOSIS — Z1212 Encounter for screening for malignant neoplasm of rectum: Secondary | ICD-10-CM

## 2017-07-08 LAB — POCT URINALYSIS DIPSTICK
Blood, UA: NEGATIVE
Glucose, UA: NEGATIVE
Leukocytes, UA: NEGATIVE
Nitrite, UA: NEGATIVE
Protein, UA: NEGATIVE

## 2017-07-08 LAB — HEMOCCULT GUIAC POC 1CARD (OFFICE): FECAL OCCULT BLD: NEGATIVE

## 2017-07-08 MED ORDER — MIRABEGRON ER 25 MG PO TB24: 25.0000 mg | ORAL_TABLET | Freq: Every day | ORAL | 0 refills | Status: DC

## 2017-07-08 MED ORDER — HYDROCHLOROTHIAZIDE 25 MG PO TABS: 25.0000 mg | ORAL_TABLET | Freq: Every day | ORAL | 3 refills | Status: DC

## 2017-07-08 NOTE — Progress Notes (Signed)
Patient ID: TAHIRAH SARA, female   DOB: [birth year], 62 y.o.   MRN: [id number] History of Present Illness: Monnie is a 62 year old white female, married,PM in for a well woman gyn exam and pap. PCP is Dr Hilma Favors.  Current Medications, Allergies, Past Medical History, Past Surgical History, Family History and Social History were reviewed in [employer] record.     Review of Systems: Patient denies any headaches, hearing loss, fatigue, blurred vision, shortness of breath, chest pain, abdominal pain, problems with bowel movements, or intercourse. No joint pain or mood swings. "can't hold urine", has to go at night 4-5 times, if has to go,may wet pants, if coughs hard may pee,has seen urology once,but did not go back Tried kegel's did not work.   Physical Exam:BP (!) 160/90 (BP Location: Right Arm, Cuff Size: Normal)   Pulse 94   Ht 5' 0.5" (1.537 m)   Wt 178 lb (80.7 kg)   BMI 34.19 kg/m Urine dipstick negative. General:  Well developed, well nourished, no acute distress Skin:  Warm and dry Neck:  Midline trachea, normal thyroid, good ROM, no lymphadenopathy,no carotid bruits heard  Lungs; Clear to auscultation bilaterally Breast:  No dominant palpable mass, retraction, or nipple discharge on right, left absent. Cardiovascular: Regular rate and rhythm Abdomen:  Soft, non tender, no hepatosplenomegaly Pelvic:  External genitalia is normal in appearance, no lesions.  The vagina is normal in appearance. Urethra has no lesions or masses. The cervix is smooth, pap with HPV performed.  Uterus is felt to be normal size, shape, and contour.  No adnexal masses or tenderness noted.Bladder is non tender, no masses felt. Rectal: Good sphincter tone, no polyps, or hemorrhoids felt.  Hemoccult negative. Extremities/musculoskeletal:  No swelling or varicosities noted, no clubbing or cyanosis Psych:  No mood changes, alert and cooperative,seems happy PHQ 2 score 0.    Impression: 1. Encounter for gynecological examination with Papanicolaou smear of cervix   2. Screening for colorectal cancer   3. History of breast cancer   4. Essential hypertension   5. Urinary incontinence, mixed       Plan: Continue losartan, will add hydroduirl Meds ordered this encounter  Medications  . hydrochlorothiazide (HYDRODIURIL) 25 MG tablet    Sig: Take 1 tablet (25 mg total) by mouth daily.    Dispense:  30 tablet    Refill:  3    Order Specific Question:   Supervising Provider    Answer:   Elonda Husky, LUTHER H [[id number]]  . mirabegron ER (MYRBETRIQ) 25 MG TB24 tablet    Sig: Take 1 tablet (25 mg total) by mouth daily.    Dispense:  35 tablet    Refill:  0    Order Specific Question:   Supervising Provider    Answer:   Elonda Husky, LUTHER H [[id number]]  F/U in 4 weeks Physical in 1 year Pap in 3 if normal Mammogram yearly Labs with PCP

## 2017-07-13 LAB — CYTOLOGY - PAP
DIAGNOSIS: NEGATIVE
HPV: NOT DETECTED

## 2017-08-11 ENCOUNTER — Ambulatory Visit (HOSPITAL_COMMUNITY)
Admission: RE | Admit: 2017-08-11 | Discharge: 2017-08-11 | Disposition: A | Discharge status: Home / Self Care | Payer: 59 | Source: Ambulatory Visit | Attending: Pain Medicine | Admitting: Pain Medicine

## 2017-08-11 ENCOUNTER — Other Ambulatory Visit (HOSPITAL_COMMUNITY): Payer: Self-pay | Admitting: Pain Medicine

## 2017-08-11 DIAGNOSIS — M160 Bilateral primary osteoarthritis of hip: Secondary | ICD-10-CM | POA: Diagnosis not present

## 2017-08-11 DIAGNOSIS — M47816 Spondylosis without myelopathy or radiculopathy, lumbar region: Secondary | ICD-10-CM | POA: Diagnosis not present

## 2017-08-11 DIAGNOSIS — M25552 Pain in left hip: Secondary | ICD-10-CM | POA: Insufficient documentation

## 2017-08-13 ENCOUNTER — Encounter: Payer: Self-pay | Admitting: Adult Health

## 2017-08-13 ENCOUNTER — Ambulatory Visit: Payer: 59 | Admitting: Adult Health

## 2017-08-13 VITALS — BP 126/83 | HR 90 | Ht 60.0 in | Wt 176.0 lb

## 2017-08-13 DIAGNOSIS — I10 Essential (primary) hypertension: Secondary | ICD-10-CM

## 2017-08-13 NOTE — Progress Notes (Signed)
  Subjective:     Patient ID: Claudia Murphy, female   DOB: 1955/03/28, 62 y.o.   MRN: [id number]  HPI Claudia Murphy is a 62 year old white female back in follow up on BP after adding hydrodiuril to losartan and trying myrbetriq   Review of Systems Still peeing often, but drinks lots of fluids Arms and hands ache and hurt, (has mobic try that again) Reviewed past medical,surgical, social and family history. Reviewed medications and allergies.     Objective:   Physical Exam BP 126/83 (BP Location: Right Arm, Patient Position: Sitting, Cuff Size: Small)   Pulse 90   Ht 5' (1.524 m)   Wt 176 lb (79.8 kg)   BMI 34.37 kg/m  Skin warm and dry.  Lungs: clear to ausculation bilaterally. Cardiovascular: regular rate and rhythm.    Assessment:     1. Essential hypertension       Plan:     Continue hydrodiuril with losartan  Stop myrbetriq,since not helping F/U in 6 weeks

## 2017-08-14 ENCOUNTER — Other Ambulatory Visit: Payer: Self-pay | Admitting: Pain Medicine

## 2017-08-14 DIAGNOSIS — M25552 Pain in left hip: Secondary | ICD-10-CM

## 2017-09-24 ENCOUNTER — Ambulatory Visit: Payer: 59 | Admitting: Adult Health

## 2017-10-08 ENCOUNTER — Other Ambulatory Visit: Payer: Self-pay

## 2017-10-08 ENCOUNTER — Ambulatory Visit: Payer: 59 | Admitting: Adult Health

## 2017-10-08 ENCOUNTER — Encounter: Payer: Self-pay | Admitting: Adult Health

## 2017-10-08 VITALS — BP 130/84 | HR 83 | Ht 60.0 in | Wt 178.0 lb

## 2017-10-08 DIAGNOSIS — Z853 Personal history of malignant neoplasm of breast: Secondary | ICD-10-CM

## 2017-10-08 DIAGNOSIS — I10 Essential (primary) hypertension: Secondary | ICD-10-CM

## 2017-10-08 DIAGNOSIS — Z801 Family history of malignant neoplasm of trachea, bronchus and lung: Secondary | ICD-10-CM | POA: Diagnosis not present

## 2017-10-08 DIAGNOSIS — G479 Sleep disorder, unspecified: Secondary | ICD-10-CM | POA: Diagnosis not present

## 2017-10-08 MED ORDER — HYDROCHLOROTHIAZIDE 25 MG PO TABS: 25.0000 mg | ORAL_TABLET | Freq: Every day | ORAL | 2 refills | Status: DC

## 2017-10-08 MED ORDER — LOSARTAN POTASSIUM 25 MG PO TABS: 25.0000 mg | ORAL_TABLET | Freq: Every day | ORAL | 2 refills | Status: DC

## 2017-10-08 MED ORDER — ZOLPIDEM TARTRATE 5 MG PO TABS: 5.0000 mg | ORAL_TABLET | Freq: Every evening | ORAL | 0 refills | Status: DC | PRN

## 2017-10-08 NOTE — Progress Notes (Signed)
  Subjective:     Patient ID: Claudia Murphy, female   DOB: Mar 15, 1955, 62 y.o.   MRN: [id number]  HPI Neenah is a 62 yea rold white female back in for BP check.She is tired and not sleeping.Her sister was recently diagnosed with lung cancer that has spread to the brain.She said oncologist mentioned being tested for gene, but has not sit up appt,  Review of Systems +tired, having hard time sleeping Denies andy headaches, dizziness Reviewed past medical,surgical, social and family history. Reviewed medications and allergies.     Objective:   Physical Exam BP 130/84 (BP Location: Right Arm, Patient Position: Sitting, Cuff Size: Normal)   Pulse 83   Ht 5' (1.524 m)   Wt 178 lb (80.7 kg)   BMI 34.76 kg/m    Skin warm and dry.  Lungs: clear to ausculation bilaterally. Cardiovascular: regular rate and rhythm. BP is better.  Discussed gene testing, and talked about hospice to help with sister. Will continue BP meds and will try Ambien to see if helps with sleep,and to tell husband she is taking and try to get at least 7 hours sleep. Face time 15 minutes with 50 5 counseling and coordinating care.   Assessment:     1. Essential hypertension   2. Sleep disturbance   3. History of breast cancer   4. Family history of lung cancer       Plan:    Meds ordered this encounter  Medications  . losartan (COZAAR) 25 MG tablet    Sig: Take 1 tablet (25 mg total) by mouth daily.    Dispense:  90 tablet    Refill:  2    Order Specific Question:   Supervising Provider    Answer:   Elonda Husky, LUTHER H [[id number]]  . hydrochlorothiazide (HYDRODIURIL) 25 MG tablet    Sig: Take 1 tablet (25 mg total) by mouth daily.    Dispense:  90 tablet    Refill:  2    Order Specific Question:   Supervising Provider    Answer:   Elonda Husky, LUTHER H [[id number]]  . zolpidem (AMBIEN) 5 MG tablet    Sig: Take 1 tablet (5 mg total) by mouth at bedtime as needed for sleep.    Dispense:  30 tablet    Refill:  0    Order Specific  Question:   Supervising Provider    Answer:   Tania Ade H [[id number]]  F/U in 4 months Referred to Roma Kayser for genetic counseling about getting labs ordered, appt 10/10 at 9 am

## 2017-11-02 ENCOUNTER — Other Ambulatory Visit: Payer: Self-pay | Admitting: Orthopedic Surgery

## 2017-11-02 DIAGNOSIS — S83289A Other tear of lateral meniscus, current injury, unspecified knee, initial encounter: Secondary | ICD-10-CM

## 2017-11-06 ENCOUNTER — Ambulatory Visit
Admission: RE | Admit: 2017-11-06 | Discharge: 2017-11-06 | Disposition: A | Discharge status: Home / Self Care | Payer: 59 | Source: Ambulatory Visit | Attending: Orthopedic Surgery | Admitting: Orthopedic Surgery

## 2017-11-06 DIAGNOSIS — S83289A Other tear of lateral meniscus, current injury, unspecified knee, initial encounter: Secondary | ICD-10-CM

## 2017-11-13 ENCOUNTER — Other Ambulatory Visit: Payer: 59

## 2017-11-25 ENCOUNTER — Encounter (HOSPITAL_COMMUNITY): Payer: Self-pay | Admitting: Genetic Counselor

## 2017-11-25 ENCOUNTER — Inpatient Hospital Stay (HOSPITAL_COMMUNITY): Payer: 59 | Attending: Genetic Counselor | Admitting: Genetic Counselor

## 2017-11-25 ENCOUNTER — Inpatient Hospital Stay (HOSPITAL_COMMUNITY): Payer: 59

## 2017-11-25 DIAGNOSIS — Z800 Family history of malignant neoplasm of digestive organs: Secondary | ICD-10-CM

## 2017-11-25 DIAGNOSIS — Z803 Family history of malignant neoplasm of breast: Secondary | ICD-10-CM | POA: Insufficient documentation

## 2017-11-25 DIAGNOSIS — Z853 Personal history of malignant neoplasm of breast: Secondary | ICD-10-CM

## 2017-11-25 DIAGNOSIS — C50912 Malignant neoplasm of unspecified site of left female breast: Secondary | ICD-10-CM

## 2017-11-25 NOTE — Progress Notes (Signed)
REFERRING PROVIDER: Redmond School, MD [address] [place], [place] [address]  PRIMARY PROVIDER:  Redmond School, MD  PRIMARY REASON FOR VISIT:  1. History of breast cancer   2. Family history of breast cancer   3. Family history of stomach cancer   4. Infiltrating ductal carcinoma of breast, stage 1, left (HCC)      HISTORY OF PRESENT ILLNESS:   Claudia Murphy, a 62 y.o. female, was seen for a [facility] cancer genetics consultation at the request of Dr. Gerarda Fraction due to a personal and family history of cancer.  Claudia Murphy presents to clinic today to discuss the possibility of a hereditary predisposition to cancer, genetic testing, and to further clarify her future cancer risks, as well as potential cancer risks for family members.   In 2006, at the age of 71, Claudia Murphy was diagnosed with ductal carcinoma of the left breast.  The tumor was triple negative. This was treated with mastectomy and chemotherapy.   CANCER HISTORY:   No history exists.     HORMONAL RISK FACTORS:  Menarche was at age 72.  First live birth at age 56.  OCP use for approximately 4 years.  Ovaries intact: yes.  Hysterectomy: no.  Menopausal status: postmenopausal.  HRT use: 0 years. Colonoscopy: yes; some polyps. Mammogram within the last year: yes. Number of breast biopsies: 1. Up to date with pelvic exams:  yes. Any excessive radiation exposure in the past:  no  Past Medical History:  Diagnosis Date  . Anxiety   . Arthritis    "all over my body" (01/25/2012)  . Breast cancer ([facility])    "left" (01/25/2012)  . Breast disorder    breast left  . Carpal tunnel syndrome on both sides   . Cataract mature, total senile    "both" (01/25/2012)  . Chronic lower back pain   . DJD (degenerative joint disease)    "neck, shoulders, back" (01/25/2012)  . Family history of breast cancer   . Family history of stomach cancer   . GERD (gastroesophageal reflux disease)   . Glaucoma   . History of stomach ulcers `  1990  . Hypertension   . 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 Left knee DJD   . Osteopenia   . Osteoporosis   . Osteoporosis   . Pain of right shoulder joint on movement 01/04/2015  . Peripheral neuropathy    "not diabetic" (01/25/2012)  . Pneumonia 1978   "when my son was born" (01/25/2012)  . PONV (postoperative nausea and vomiting)   . Sebaceous cyst 01/04/2015    Past Surgical History:  Procedure Laterality Date  . BREAST BIOPSY  2006   "left" (01/25/2012)  . FOOT SURGERY    . INJECTION KNEE  2013   lt  . KNEE ARTHROSCOPY  1980's   "left" (01/25/2012)  . MASTECTOMY  01/07   left  . PARTIAL KNEE ARTHROPLASTY  01/25/2012   Procedure: UNICOMPARTMENTAL KNEE;  Surgeon: Lorn Junes, MD;  Location: [facility];  Service: Orthopedics;  Laterality: Left;  LEFT KNEE UNICOMPARTMENTAL ARTHROPLASTY   . PERIPHERALLY INSERTED CENTRAL CATHETER INSERTION  2007  . PLANTAR FASCIA SURGERY  11/25/2010   "left" (01/25/2012)  . REPLACEMENT UNICONDYLAR JOINT KNEE  01/25/2012   "left" (01/25/2012)  . THROMBECTOMY / EMBOLECTOMY SUBCLAVIAN ARTERY  2007   "post PICC line removal" (01/25/2012)  . TUBAL LIGATION  1970's    Social History   Socioeconomic History  . Marital status:  Married    Spouse name: Not on file  . Number of children: Not on file  . Years of education: Not on file  . Highest education level: Not on file  Occupational History  . Not on file  Social Needs  . Financial resource strain: Not on file  . Food insecurity:    Worry: Not on file    Inability: Not on file  . Transportation needs:    Medical: Not on file    Non-medical: Not on file  Tobacco Use  . Smoking status: Former Smoker    Packs/day: 1.50    Years: 33.00    Pack years: 49.50    Types: Cigarettes    Last attempt to quit: 11/16/2004    Years since quitting: 13.0  . Smokeless tobacco: Never Used  Substance and Sexual Activity  . Alcohol use: No  . Drug use: No  . Sexual  activity: Yes    Birth control/protection: Post-menopausal  Lifestyle  . Physical activity:    Days per week: Not on file    Minutes per session: Not on file  . Stress: Not on file  Relationships  . Social connections:    Talks on phone: Not on file    Gets together: Not on file    Attends religious service: Not on file    Active member of club or organization: Not on file    Attends meetings of clubs or organizations: Not on file    Relationship status: Not on file  Other Topics Concern  . Not on file  Social History Narrative  . Not on file     FAMILY HISTORY:  We obtained a detailed, 4-generation family history.  Significant diagnoses are listed below: Family History  Problem Relation Age of Onset  . Diabetes Mother   . Hypertension Mother   . Heart disease Father   . Hypertension Father   . Diabetes Son   . Diabetes Sister   . Thyroid disease Sister   . Cancer Sister        lung and brain  . Diabetes Sister   . Diabetes Sister   . Breast cancer Sister 36  . Diabetes Sister   . COPD Brother   . Hypertension Brother   . Breast cancer Maternal Aunt 48  . Stomach cancer Maternal Uncle   . Cancer Other 35       possible uterine cancer    The patient has one son who is cancer free.  She has five sisters and a brother.  One sister had breast cancer at 14 and another sister has lung cancer that spread to her brain.  This sister has a daughter who had possible uterine cancer at 21.  The patient's parents are both deceased.  The patient's mother died at 58. She had a full brother and sister and a paternal half brother.  Her sister had breast cancer at 74 and full brother had stomach cancer.  Both maternal grandparents are deceased.  The patient's father died at 1.  He had four brothers and a sister, none had cancer.  The paternal grandparents are deceased.  Claudia Murphy is unaware of previous family history of genetic testing for hereditary cancer risks. Patient's maternal  ancestors are of Caucasian NOS descent, and paternal ancestors are of Caucasian NOS descent. There is no reported Ashkenazi Jewish ancestry. There is no known consanguinity.  GENETIC COUNSELING ASSESSMENT: Claudia Murphy is a 62 y.o. female with a personal and family  history of breast cancer which is somewhat suggestive of a hereditary cancer syndrome and predisposition to cancer. We, therefore, discussed and recommended the following at today's visit.   DISCUSSION: We discussed that about 5-10% of breast cancer is hereditary with most cases due to BRCA mutations.  Her cancer was triple negative.  BRCA1 and PALB2 mutation are most commonly associated with TN breast cancer.  There are other genes that can increase the risk for hereditary breast cancer syndromes.    We reviewed the characteristics, features and inheritance patterns of hereditary cancer syndromes. We also discussed genetic testing, including the appropriate family members to test, the process of testing, insurance coverage and turn-around-time for results. We discussed the implications of a negative, positive and/or variant of uncertain significant result. We recommended Claudia Murphy pursue genetic testing for the [facility] panel. The Multi-Gene Panel offered by [facility] includes sequencing and/or deletion duplication testing of the following 84 genes: AIP, ALK, APC, ATM, AXIN2,BAP1,  BARD1, BLM, BMPR1A, BRCA1, BRCA2, BRIP1, CASR, CDC73, CDH1, CDK4, CDKN1B, CDKN1C, CDKN2A (p14ARF), CDKN2A (p16INK4a), CEBPA, CHEK2, CTNNA1, DICER1, DIS3L2, EGFR (c.2369C>T, p.Thr790Met variant only), EPCAM (Deletion/duplication testing only), FH, FLCN, GATA2, GPC3, GREM1 (Promoter region deletion/duplication testing only), HOXB13 (c.251G>A, p.Gly84Glu), HRAS, KIT, MAX, MEN1, MET, MITF (c.952G>A, p.Glu318Lys variant only), MLH1, MSH2, MSH3, MSH6, MUTYH, NBN, NF1, NF2, NTHL1, PALB2, PDGFRA, PHOX2B, PMS2, POLD1, POLE, POT1, PRKAR1A, PTCH1, PTEN, RAD50, RAD51C, RAD51D, RB1,  RECQL4, RET, RUNX1, SDHAF2, SDHA (sequence changes only), SDHB, SDHC, SDHD, SMAD4, SMARCA4, SMARCB1, SMARCE1, STK11, SUFU, TERC, TERT, TMEM127, TP53, TSC1, TSC2, VHL, WRN and WT1.    Based on Claudia Murphy personal and family history of cancer, she meets medical criteria for genetic testing. Despite that she meets criteria, she may still have an out of pocket cost. We discussed that if her out of pocket cost for testing is over $100, the laboratory will call and confirm whether she wants to proceed with testing.  If the out of pocket cost of testing is less than $100 she will be billed by the genetic testing laboratory.   PLAN: After considering the risks, benefits, and limitations, Claudia Murphy  provided informed consent to pursue genetic testing and the blood sample was sent to [facility] for analysis of the Multi-cancer gene panel. Results should be available within approximately 2-3 weeks' time, at which point they will be disclosed by telephone to Claudia Murphy, as will any additional recommendations warranted by these results. Claudia Murphy will receive a summary of her genetic counseling visit and a copy of her results once available. This information will also be available in Epic. We encouraged Claudia Murphy to remain in contact with cancer genetics annually so that we can continuously update the family history and inform her of any changes in cancer genetics and testing that may be of benefit for her family. Claudia Murphy questions were answered to her satisfaction today. Our contact information was provided should additional questions or concerns arise.  Lastly, we encouraged Claudia Murphy to remain in contact with cancer genetics annually so that we can continuously update the family history and inform her of any changes in cancer genetics and testing that may be of benefit for this family.   Ms.  Murphy questions were answered to her satisfaction today. Our contact information was provided should additional  questions or concerns arise. Thank you for the referral and allowing [place] to share in the care of your patient.   Beulah Capobianco P. Florene Glen, [place], [facility] Certified Genetic Counselor [email or web address] phone:  [phone]  The patient was seen for a total of 30 minutes in face-to-face genetic counseling.  This patient was discussed with Drs. Magrinat, Lindi Adie and/or Burr Medico who agrees with the above.    _______________________________________________________________________ For Office Staff:  Number of people involved in session: 1 Was an Intern/ student involved with case: no

## 2017-12-10 ENCOUNTER — Telehealth: Payer: Self-pay | Admitting: Genetic Counselor

## 2017-12-10 ENCOUNTER — Encounter: Payer: Self-pay | Admitting: Genetic Counselor

## 2017-12-10 DIAGNOSIS — Z1379 Encounter for other screening for genetic and chromosomal anomalies: Secondary | ICD-10-CM | POA: Insufficient documentation

## 2017-12-10 NOTE — Telephone Encounter (Signed)
LM on VM that results are back and to please CB. 

## 2017-12-14 NOTE — Telephone Encounter (Signed)
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  Explained that this is a recessive condition and therefore is not affected.  We can offer testing to family members for this mutation, as well as see her back in clinic to discuss a bit further.  She agreed and we will schedule her on 11/14 at 11 AM.

## 2017-12-30 ENCOUNTER — Inpatient Hospital Stay (HOSPITAL_COMMUNITY): Payer: 59 | Attending: Genetic Counselor | Admitting: Genetic Counselor

## 2017-12-30 DIAGNOSIS — Z853 Personal history of malignant neoplasm of breast: Secondary | ICD-10-CM

## 2017-12-30 DIAGNOSIS — Z1379 Encounter for other screening for genetic and chromosomal anomalies: Secondary | ICD-10-CM | POA: Diagnosis not present

## 2017-12-30 NOTE — Progress Notes (Signed)
HPI: Ms. Rusconi was previously seen in the [facility] clinic due to a personal and family history of cancer, an identified MUTYH familial mutation and concerns regarding a hereditary predisposition to cancer. Please refer to our prior cancer genetics clinic note for more information regarding Ms. Hallett medical, social and family histories, and our assessment and recommendations, at the time. Ms. Doi recent genetic test results were disclosed to her, as were recommendations warranted by these results. These results and recommendations are discussed in more detail below.   FAMILY HISTORY:  We obtained a detailed, 4-generation family history.  Significant diagnoses are listed below: Family History  Problem Relation Age of Onset  . Diabetes Mother   . Hypertension Mother   . Heart disease Father   . Hypertension Father   . Diabetes Son   . Diabetes Sister   . Thyroid disease Sister   . Cancer Sister        lung and brain  . Diabetes Sister   . Diabetes Sister   . Breast cancer Sister 67  . Diabetes Sister   . COPD Brother   . Hypertension Brother   . Breast cancer Maternal Aunt 48  . Stomach cancer Maternal Uncle   . Cancer Other 35       possible uterine cancer    The patient has one son who is cancer free.  She has five sisters and a brother.  One sister had breast cancer at 43 and another sister has lung cancer that spread to her brain.  This sister has a daughter who had possible uterine cancer at 46.  The patient's parents are both deceased.  The patient's mother died at 1. She had a full brother and sister and a paternal half brother.  Her sister had breast cancer at 75 and full brother had stomach cancer.  Both maternal grandparents are deceased.  The patient's father died at 67.  He had four brothers and a sister, none had cancer.  The paternal grandparents are deceased.  Ms. Beckett is unaware of previous family history of genetic testing for hereditary cancer  risks. Patient's maternal ancestors are of Caucasian NOS descent, and paternal ancestors are of Caucasian NOS descent. There is no reported Ashkenazi Jewish ancestry. There is no known consanguinity.  GENETIC TEST RESULTS: At the time of Ms. Byrer visit, we recommended she pursue genetic testing of the multi cancer panel. This test, which included sequencing and deletion/duplication analysis of the genes listed on the test report, was performed at [facility]. Ms. Glad was seen today with her family members to discuss her genetic test results. 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  Since Ms. Kiely has only one pathogenic mutation in MUTYH, she is NOT affected with MYH-associated polyposis, but instead is a carrier. A copy of the test report has been scanned into Epic and is located under the Molecular Pathology section of the Results Review tab.   SCREENING RECOMMENDATIONS: We discussed the implications of a heterozygous MUTYH mutation for Ms. Moffet, and discussed who else in the family should have genetic testing. We recommended Ms. Steller follow the most updated medical management guidelines (NCCN Guidelines v1.2019) for heterozygous MUTYH mutations; all of which are outlined below. These can be coordinated by Ms. Melony Overly GI doctor or her primary provider.   Personal history of colon cancer  Follow instructions provided by your physician based on your personal history.  Do not have  a personal history of colon cancer but have a parent/sibling/child with colon cancer: Colonoscopy every 5 years starting at age 20 or 68 years younger than the earliest age of onset, whichever is younger.  Do not have a personal history of colon cancer but do not have a parent/sibling/child with colon cancer: Data is uncertain on whether specialized screening is warranted.Marland Kitchen  FAMILY MEMBERS: Since we now know the mutation in Ms. Ganus, we  can test at-risk relatives to determine whether or not they have inherited the mutation and are at increased risk for cancer. Her son, Quita Skye, and three sisters were at today's visit.  One sister will be seen in [place] for genetic testing.  The other two sisters want to be tested for this mutation.  We discussed that the sisters meet medical criteria for genetic testing based on their personal and family history of cancer.  They will undergo similar testing as Ms. Brotzman did. We will be happy to meet with any other family members or refer them to a genetic counselor in their local area. To locate genetic counselors in other cities, individuals can visit the website of the [employer] ([employer].se) and Secretary/administrator for a Social worker by zip code.   We strongly encouraged Ms. Ey to remain in contact with [place] in cancer genetics on an annual basis so we can update Ms. Bakken personal and family histories, and inform her of advances in cancer genetics that may be of benefit for the entire family. Ms. Hinderliter knows she is also welcome to call with any questions or concerns, at any time.   Roma Kayser, MS, [facility]  Certified Genetic Counselor  [phone]

## 2018-02-07 ENCOUNTER — Ambulatory Visit: Payer: 59 | Admitting: Adult Health

## 2018-02-07 ENCOUNTER — Encounter: Payer: Self-pay | Admitting: Adult Health

## 2018-02-07 ENCOUNTER — Encounter (INDEPENDENT_AMBULATORY_CARE_PROVIDER_SITE_OTHER): Payer: Self-pay

## 2018-02-07 VITALS — BP 110/70 | HR 77 | Ht 61.0 in | Wt 175.4 lb

## 2018-02-07 DIAGNOSIS — Z1212 Encounter for screening for malignant neoplasm of rectum: Secondary | ICD-10-CM | POA: Diagnosis not present

## 2018-02-07 DIAGNOSIS — Z1211 Encounter for screening for malignant neoplasm of colon: Secondary | ICD-10-CM | POA: Diagnosis not present

## 2018-02-07 DIAGNOSIS — I10 Essential (primary) hypertension: Secondary | ICD-10-CM

## 2018-02-07 MED ORDER — HYDROCHLOROTHIAZIDE 25 MG PO TABS: 25.0000 mg | ORAL_TABLET | Freq: Every day | ORAL | 2 refills | Status: DC

## 2018-02-07 MED ORDER — LOSARTAN POTASSIUM 25 MG PO TABS: 25.0000 mg | ORAL_TABLET | Freq: Every day | ORAL | 2 refills | Status: DC

## 2018-02-07 NOTE — Progress Notes (Signed)
Patient ID: KATEENA DEGROOTE, female   DOB: 04/04/1955, 62 y.o.   MRN: [id number] History of Present Illness: Helen is a 62 year old white female,married in for follow up on BP and doing good, she says it has been normal at other doctor visits too. PCP is Dr Gerarda Fraction.  Current Medications, Allergies, Past Medical History, Past Surgical History, Family History and Social History were reviewed in  record.     Review of Systems: Patient denies any headaches, hearing loss, fatigue, blurred vision, shortness of breath, chest pain, abdominal pain, problems with bowel movements, urination, or intercourse. No joint pain or mood swings. She says she carries gene for colon cancer and wants colonoscopy referral.    Physical Exam:BP 110/70 (BP Location: Right Arm, Patient Position: Sitting, Cuff Size: Normal)   Pulse 77   Ht 5\' 1"  (1.549 m)   Wt 175 lb 6.4 oz (79.6 kg)   BMI 33.14 kg/m  General:  Well developed, well nourished, no acute distress Skin:  Warm and dry Lungs; Clear to auscultation bilaterally Cardiovascular: Regular rate and rhythm Abdomen:  Soft, non tender, no hepatosplenomegaly Psych:  No mood changes, alert and cooperative,seems happy Fall risk is low. PHQ 2 score is  0. Continue BP meds.   Impression: 1. Essential hypertension   2. Screening for colorectal cancer       Plan: Meds ordered this encounter  Medications  . losartan (COZAAR) 25 MG tablet    Sig: Take 1 tablet (25 mg total) by mouth daily.    Dispense:  90 tablet    Refill:  2    Order Specific Question:   Supervising Provider    Answer:   Elonda Husky, LUTHER H [[id number]]  . hydrochlorothiazide (HYDRODIURIL) 25 MG tablet    Sig: Take 1 tablet (25 mg total) by mouth daily.    Dispense:  90 tablet    Refill:  2    Order Specific Question:   Supervising Provider    Answer:   Florian Buff [[id number]]  Referred to Dr Oneida Alar for colonoscopy Physical in June  Call with any problems or  concerns

## 2018-02-14 ENCOUNTER — Encounter: Payer: Self-pay | Admitting: Internal Medicine

## 2018-02-16 ENCOUNTER — Other Ambulatory Visit (HOSPITAL_COMMUNITY): Payer: Self-pay | Admitting: Family Medicine

## 2018-02-16 DIAGNOSIS — Z1231 Encounter for screening mammogram for malignant neoplasm of breast: Secondary | ICD-10-CM

## 2018-02-18 ENCOUNTER — Ambulatory Visit (HOSPITAL_COMMUNITY)
Admission: RE | Admit: 2018-02-18 | Discharge: 2018-02-18 | Disposition: A | Discharge status: Home / Self Care | Payer: 59 | Source: Ambulatory Visit | Attending: Family Medicine | Admitting: Family Medicine

## 2018-02-18 ENCOUNTER — Encounter (HOSPITAL_COMMUNITY): Payer: Self-pay

## 2018-02-18 DIAGNOSIS — Z1231 Encounter for screening mammogram for malignant neoplasm of breast: Secondary | ICD-10-CM | POA: Diagnosis not present

## 2018-03-28 ENCOUNTER — Other Ambulatory Visit: Payer: Self-pay | Admitting: Pain Medicine

## 2018-03-28 DIAGNOSIS — M545 Low back pain, unspecified: Secondary | ICD-10-CM

## 2018-04-03 ENCOUNTER — Ambulatory Visit
Admission: RE | Admit: 2018-04-03 | Discharge: 2018-04-03 | Disposition: A | Discharge status: Home / Self Care | Payer: 59 | Source: Ambulatory Visit | Attending: Pain Medicine | Admitting: Pain Medicine

## 2018-04-03 DIAGNOSIS — M545 Low back pain, unspecified: Secondary | ICD-10-CM

## 2018-04-29 ENCOUNTER — Ambulatory Visit: Payer: 59 | Admitting: Gastroenterology

## 2018-05-05 ENCOUNTER — Ambulatory Visit: Payer: 59 | Admitting: Gastroenterology

## 2018-06-21 ENCOUNTER — Other Ambulatory Visit: Payer: Self-pay

## 2018-06-21 ENCOUNTER — Ambulatory Visit (INDEPENDENT_AMBULATORY_CARE_PROVIDER_SITE_OTHER): Payer: 59 | Admitting: Gastroenterology

## 2018-06-21 ENCOUNTER — Encounter: Payer: Self-pay | Admitting: Gastroenterology

## 2018-06-21 DIAGNOSIS — Z1211 Encounter for screening for malignant neoplasm of colon: Secondary | ICD-10-CM | POA: Diagnosis not present

## 2018-06-21 MED ORDER — LUBIPROSTONE 24 MCG PO CAPS: 24.0000 ug | ORAL_CAPSULE | Freq: Two times a day (BID) | ORAL | 3 refills | Status: DC

## 2018-06-21 NOTE — Patient Instructions (Addendum)
We have arranged a colonoscopy in the near future with Dr. Gala Romney.   For constipation: start taking Amitiza one gelcap WITH FOOD to avoid nausea.   We will see you back in 3-4 months!  It was a pleasure to see you today. I strive to create trusting relationships with patients to provide genuine, compassionate, and quality care. I value your feedback. If you receive a survey regarding your visit,  I greatly appreciate you taking time to fill this out.   Annitta Needs, PhD, ANP-BC [facility]

## 2018-06-21 NOTE — Progress Notes (Addendum)
REVIEWED-NO ADDITIONAL RECOMMENDATIONS.      Primary Care Physician:  Claudia School, MD  Primary GI: Dr. Oneida Murphy  Virtual Visit via Telephone Note Due to COVID-19, visit is conducted virtually and was requested by patient.   I connected with Claudia Murphy on 06/23/18 at 11:00 AM EDT by telephone and verified that I am speaking with the correct person using two identifiers.   I discussed the limitations, risks, security and privacy concerns of performing an evaluation and management service by telephone and the availability of in person appointments. I also discussed with the patient that there may be a patient responsible charge related to this service. The patient expressed understanding and agreed to proceed.  Chief Complaint  Patient presents with  . Consult    TCS. Last had done 10 yrs ago. hx polyps  . Constipation    takes pain meds for back though     History of Present Illness: 63 year old female referred by Dr. Gerarda Murphy for screening colonoscopy. Last in 2008 by Dr. Gala Murphy without polyps. She reports very remote history of polyps, which we do not have on file.   2008 Dr. Gala Murphy: anal papilla and hemorrhoids, otherwise normal rectum, sigmoid diverticula, normal TI and remainder of colonic mucosa. Repeat screening colonoscopy in 5-10 years. Possible polyps per patient in remote past, but we do not have documentation of this.   She has a personal history of breast cancer in 2007. No family history of colorectal cancer. Believes sisters may have had polyps in the past. 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  Since Ms. 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 As she does not have a personal history of colon or family history, thereby data is uncertain regarding specialized screening.   No rectal  bleeding. No abdominal pain. Constipation in setting of opioids. Will go a few days without a BM. Straining. OTC stool softener.  No FH of colon cancer.  No reflux. No dysphagia. No weight loss or lack of appetite.   Breast cancer: 2007. Surgery Jan 2007.     Past Medical History:  Diagnosis Date  . Anxiety   . Arthritis    "all over my body" (01/25/2012)  . Breast cancer ([place])    "left" (01/25/2012)  . Breast disorder    breast left  . Carpal tunnel syndrome on both sides   . Cataract mature, total senile    "both" (01/25/2012)  . Cataracts, bilateral   . Chronic lower back pain   . DJD (degenerative joint disease)    "neck, shoulders, back" (01/25/2012)  . Family history of breast cancer   . Family history of stomach cancer    ?niece? patient unsure exactly what niece had  . GERD (gastroesophageal reflux disease)   . History of stomach ulcers ` 1990  . Hypertension   . 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 Left knee DJD   . Osteopenia   . Osteoporosis   . Osteoporosis   . Pain of right shoulder joint on movement 01/04/2015  . Peripheral neuropathy    "not diabetic" (01/25/2012)  . Pneumonia 1978   "when my son was born" (01/25/2012)  . PONV (postoperative nausea and vomiting)   . Sebaceous cyst 01/04/2015     Past Surgical History:  Procedure Laterality Date  . BREAST BIOPSY  2006   "  left" (01/25/2012)  . COLONOSCOPY  2008   anal papilla and hemorrhoids, otherwise normal rectum, sigmoid diverticula, normal TI and remainder of colonic mucosa. Repeat screening colonoscopy in 5-10 years.  Marland Kitchen FOOT SURGERY    . INJECTION KNEE  2013   lt  . KNEE ARTHROSCOPY  1980's   "left" (01/25/2012)  . MASTECTOMY  01/07   left  . PARTIAL KNEE ARTHROPLASTY  01/25/2012   Procedure: UNICOMPARTMENTAL KNEE;  Surgeon: Claudia Junes, MD;  Location: [facility];  Service: Orthopedics;  Laterality: Left;  LEFT KNEE UNICOMPARTMENTAL ARTHROPLASTY   . PERIPHERALLY  INSERTED CENTRAL CATHETER INSERTION  2007  . PLANTAR FASCIA SURGERY  11/25/2010   "left" (01/25/2012)  . REPLACEMENT UNICONDYLAR JOINT KNEE  01/25/2012   "left" (01/25/2012)  . THROMBECTOMY / EMBOLECTOMY SUBCLAVIAN ARTERY  2007   "post PICC line removal" (01/25/2012)  . TUBAL LIGATION  1970's     Current Meds  Medication Sig  . aspirin 81 MG tablet Take 81 mg by mouth daily.  . calcium carbonate (OS-CAL - DOSED IN MG OF ELEMENTAL CALCIUM) 1250 (500 Ca) MG tablet Take 2 tablets by mouth 2 (two) times daily with a meal.   . diclofenac (VOLTAREN) 75 MG EC tablet Take 1 tablet by mouth 2 (two) times a day.  . gabapentin (NEURONTIN) 400 MG capsule TAKE ONE CAPSULE BY MOUTH EVERY MORNING , 1 MIDDAY, THEN 1 AT BEDTIME  . hydrochlorothiazide (HYDRODIURIL) 25 MG tablet Take 1 tablet (25 mg total) by mouth daily.  Marland Kitchen HYDROcodone-acetaminophen (NORCO) 7.5-325 MG per tablet Take 1 tablet by mouth every 6 (six) hours as needed for pain.  Marland Kitchen losartan (COZAAR) 25 MG tablet Take 1 tablet (25 mg total) by mouth daily.      Review of Systems: Gen: Denies fever, chills, anorexia. Denies fatigue, weakness, weight loss.  CV: Denies chest pain, palpitations, syncope, peripheral edema, and claudication. Resp: Denies dyspnea at rest, cough, wheezing, coughing up blood, and pleurisy. GI: see HPI Derm: Denies rash, itching, dry skin Psych: Denies depression, anxiety, memory loss, confusion. No homicidal or suicidal ideation.  MSK: back pain, needs surgery upcoming Heme: Denies bruising, bleeding, and enlarged lymph nodes.  Observations/Objective: No distress. Video call. Pleasant and cooperative, maintains eye contact.   Assessment and Plan: 63 year old female with need for colonoscopy, with last in 2008 without polyps. Reporting a personal history of polyps in very remote past, which we do not have on record. Noting constipation in setting of opioids. Will start Amitiza 24 mcg BID.   Proceed with TCS with Dr.  Oneida Murphy in near future: the risks, benefits, and alternatives have been discussed with the patient in detail. The patient states understanding and desires to proceed. Propofol due to polypharmacy Return in 3-4 months  Follow Up Instructions:    I discussed the assessment and treatment plan with the patient. The patient was provided an opportunity to ask questions and all were answered. The patient agreed with the plan and demonstrated an understanding of the instructions.   The patient was advised to call back or seek an in-person evaluation if the symptoms worsen or if the condition fails to improve as anticipated.  I provided 20 minutes of face-to-face time during this encounter.  Annitta Needs, PhD, ANP-BC [facility]

## 2018-06-22 ENCOUNTER — Encounter: Payer: Self-pay | Admitting: Gastroenterology

## 2018-06-23 ENCOUNTER — Encounter: Payer: Self-pay | Admitting: Gastroenterology

## 2018-06-27 ENCOUNTER — Telehealth: Payer: Self-pay | Admitting: *Deleted

## 2018-06-27 NOTE — Telephone Encounter (Signed)
LMOVM to schedule TCS w/ propofol with RMR

## 2018-06-27 NOTE — Progress Notes (Signed)
CC'D TO PCP °

## 2018-06-28 ENCOUNTER — Telehealth: Payer: Self-pay | Admitting: Gastroenterology

## 2018-06-28 NOTE — Telephone Encounter (Signed)
Spoke with patient and she states she advised AB she wants a "female doctor" to do her procedure. I advised her RMR is the one performed procedure in the past. She states she knows and she woke up and was in pain. Please advise AB thanks

## 2018-06-28 NOTE — Telephone Encounter (Signed)
We will be pursuing colonoscopy with Propofol due to polypharmacy. Last procedure in 2008, and this was the last time she was seen prior to virtual visit May 2020. Patient requesting female physician for colonoscopy. Routing to Dr. Gala Romney and Dr. Oneida Alar regarding changing to Dr. Oneida Alar as GI attending.

## 2018-06-28 NOTE — Telephone Encounter (Signed)
I am absolutely a okay with providing patient with a female gastroenterologist.

## 2018-06-28 NOTE — Telephone Encounter (Signed)
Noted. See prior phone note. 

## 2018-06-28 NOTE — Telephone Encounter (Signed)
Patient returned call, please call back, she will be available until 1pm

## 2018-07-05 NOTE — Telephone Encounter (Signed)
REVIEWED. PT CAN HAVE TC WITH FEMALE DOC.

## 2018-07-05 NOTE — Telephone Encounter (Signed)
Patient aware. Will call once we receive august schedule to get her scheduled.

## 2018-07-08 ENCOUNTER — Ambulatory Visit: Payer: 59 | Admitting: Gastroenterology

## 2018-07-18 ENCOUNTER — Telehealth: Payer: Self-pay | Admitting: *Deleted

## 2018-07-18 NOTE — Telephone Encounter (Signed)
LMOVM TO CALL to schedule TCS with propofol with [facility]

## 2018-07-19 NOTE — Telephone Encounter (Signed)
LMOVM. Letter mailed  

## 2018-07-20 ENCOUNTER — Other Ambulatory Visit: Payer: Self-pay | Admitting: *Deleted

## 2018-07-20 DIAGNOSIS — Z1211 Encounter for screening for malignant neoplasm of colon: Secondary | ICD-10-CM

## 2018-07-20 MED ORDER — PEG 3350-KCL-NA BICARB-NACL 420 G PO SOLR: 4000.0000 mL | Freq: Once | ORAL | 0 refills | Status: AC

## 2018-07-20 NOTE — Telephone Encounter (Signed)
Pre-op appt mailed with instructions 

## 2018-07-20 NOTE — Telephone Encounter (Signed)
Patient called back. She is scheduled for 9/1 at 1:45pm. Patient aware she will need to go for pre-op appt and I will mail this to her with her instructions (confirmed address). [facility] per pt request. Orders entered.

## 2018-07-20 NOTE — Addendum Note (Signed)
Addended by: Inge Rise on: 07/20/2018 11:48 AM   Modules accepted: Orders

## 2018-08-01 ENCOUNTER — Telehealth: Payer: Self-pay

## 2018-08-01 NOTE — Telephone Encounter (Signed)
Pt called to discuss getting a note from the provider since she will have to self quarantine after her covid-19 test to have her procedure 10/18/18. Pt isn't going to be able to be off after covid-19 testing unless she receives a note.

## 2018-08-02 NOTE — Telephone Encounter (Signed)
Noted  

## 2018-08-02 NOTE — Telephone Encounter (Signed)
There should be a template for a letter to allow patient to be out due to COVID testing. [facility]ia/[facility] clinical pool, can you access this and complete?

## 2018-08-02 NOTE — Telephone Encounter (Signed)
Called patient and made aware we are unsure if they will require COVID-19 in September. I advised patient if it is required she will be called to have this scheduled and then a letter can be provided to her.

## 2018-09-16 ENCOUNTER — Telehealth: Payer: Self-pay | Admitting: Gastroenterology

## 2018-09-16 MED ORDER — NA SULFATE-K SULFATE-MG SULF 17.5-3.13-1.6 GM/177ML PO SOLN: 1.0000 | Freq: Once | ORAL | 0 refills | Status: AC

## 2018-09-16 NOTE — Telephone Encounter (Signed)
Pt called asking if there is anything else she can use as a prep other than what we called in. She does not want the gallon jug prep because it makes her sick and she can't drink that much. She is requesting something else. Please advise. [facility] [address] [place].

## 2018-09-16 NOTE — Telephone Encounter (Signed)
Called patient. Made aware will send in suprep but can't guarantee this is covered by insurance. Patient aware I will mail instructions for this. Rx sent. Patient stated she would pay out of pocket for it if it is not covered.

## 2018-09-29 ENCOUNTER — Ambulatory Visit: Payer: 59 | Admitting: Gastroenterology

## 2018-09-29 ENCOUNTER — Other Ambulatory Visit: Payer: Self-pay

## 2018-09-29 ENCOUNTER — Encounter: Payer: Self-pay | Admitting: *Deleted

## 2018-10-11 NOTE — Patient Instructions (Signed)
Claudia Murphy  10/11/2018     @PREFPERIOPPHARMACY @   Your procedure is scheduled on  10/18/2018.  Report to [facility] at  1200  P.M.  Call this number if you have problems the morning of surgery:  [phone]   Remember:  Follow the diet and prep instructions given to you by Dr Nona Dell office.                   Take these medicines the morning of surgery with A SIP OF WATER  Gabapentin, hydrocodone(if needed)    Do not wear jewelry, make-up or nail polish.  Do not wear lotions, powders, or perfumes. Please wear deodorant and brush your teeth.  Do not shave 48 hours prior to surgery.  Men may shave face and neck.  Do not bring valuables to the hospital.  [facility] is not responsible for any belongings or valuables.  Contacts, dentures or bridgework may not be worn into surgery.  Leave your suitcase in the car.  After surgery it may be brought to your room.  For patients admitted to the hospital, discharge time will be determined by your treatment team.  Patients discharged the day of surgery will not be allowed to drive home.   Name and phone number of your driver:   family Special instructions:  None  Please read over the following fact sheets that you were given. Anesthesia Post-op Instructions and Care and Recovery After Surgery       Colonoscopy, Adult, Care After This sheet gives you information about how to care for yourself after your procedure. Your health care provider may also give you more specific instructions. If you have problems or questions, contact your health care provider. What can I expect after the procedure? After the procedure, it is common to have:  A small amount of blood in your stool for 24 hours after the procedure.  Some gas.  Mild abdominal cramping or bloating. Follow these instructions at home: General instructions  For the first 24 hours after the procedure: ? Do not drive or use machinery. ? Do not sign important  documents. ? Do not drink alcohol. ? Do your regular daily activities at a slower pace than normal. ? Eat soft, easy-to-digest foods.  Take over-the-counter or prescription medicines only as told by your health care provider. Relieving cramping and bloating   Try walking around when you have cramps or feel bloated.  Apply heat to your abdomen as told by your health care provider. Use a heat source that your health care provider recommends, such as a moist heat pack or a heating pad. ? Place a towel between your skin and the heat source. ? Leave the heat on for 20-30 minutes. ? Remove the heat if your skin turns bright red. This is especially important if you are unable to feel pain, heat, or cold. You may have a greater risk of getting burned. Eating and drinking   Drink enough fluid to keep your urine pale yellow.  Resume your normal diet as instructed by your health care provider. Avoid heavy or fried foods that are hard to digest.  Avoid drinking alcohol for as long as instructed by your health care provider. Contact a health care provider if:  You have blood in your stool 2-3 days after the procedure. Get help right away if:  You have more than a small spotting of blood in your stool.  You pass large blood clots  in your stool.  Your abdomen is swollen.  You have nausea or vomiting.  You have a fever.  You have increasing abdominal pain that is not relieved with medicine. Summary  After the procedure, it is common to have a small amount of blood in your stool. You may also have mild abdominal cramping and bloating.  For the first 24 hours after the procedure, do not drive or use machinery, sign important documents, or drink alcohol.  Contact your health care provider if you have a lot of blood in your stool, nausea or vomiting, a fever, or increased abdominal pain. This information is not intended to replace advice given to you by your health care provider. Make sure  you discuss any questions you have with your health care provider. Document Released: 09/17/2003 Document Revised: 11/25/2016 Document Reviewed: 04/16/2015 Elsevier Patient Education  2020 [place] After These instructions provide you with information about caring for yourself after your procedure. Your health care provider may also give you more specific instructions. Your treatment has been planned according to current medical practices, but problems sometimes occur. Call your health care provider if you have any problems or questions after your procedure. What can I expect after the procedure? After your procedure, you may:  Feel sleepy for several hours.  Feel clumsy and have poor balance for several hours.  Feel forgetful about what happened after the procedure.  Have poor judgment for several hours.  Feel nauseous or vomit.  Have a sore throat if you had a breathing tube during the procedure. Follow these instructions at home: For at least 24 hours after the procedure:      Have a responsible adult stay with you. It is important to have someone help care for you until you are awake and alert.  Rest as needed.  Do not: ? Participate in activities in which you could fall or become injured. ? Drive. ? Use heavy machinery. ? Drink alcohol. ? Take sleeping pills or medicines that cause drowsiness. ? Make important decisions or sign legal documents. ? Take care of children on your own. Eating and drinking  Follow the diet that is recommended by your health care provider.  If you vomit, drink water, juice, or soup when you can drink without vomiting.  Make sure you have little or no nausea before eating solid foods. General instructions  Take over-the-counter and prescription medicines only as told by your health care provider.  If you have sleep apnea, surgery and certain medicines can increase your risk for breathing problems.  Follow instructions from your health care provider about wearing your sleep device: ? Anytime you are sleeping, including during daytime naps. ? While taking prescription pain medicines, sleeping medicines, or medicines that make you drowsy.  If you smoke, do not smoke without supervision.  Keep all follow-up visits as told by your health care provider. This is important. Contact a health care provider if:  You keep feeling nauseous or you keep vomiting.  You feel light-headed.  You develop a rash.  You have a fever. Get help right away if:  You have trouble breathing. Summary  For several hours after your procedure, you may feel sleepy and have poor judgment.  Have a responsible adult stay with you for at least 24 hours or until you are awake and alert. This information is not intended to replace advice given to you by your health care provider. Make sure you discuss any questions you have  with your health care provider. Document Released: 05/26/2015 Document Revised: 05/03/2017 Document Reviewed: 05/26/2015 Elsevier Patient Education  2020 [employer].

## 2018-10-13 ENCOUNTER — Telehealth: Payer: Self-pay | Admitting: *Deleted

## 2018-10-13 NOTE — Telephone Encounter (Signed)
Received a call from the preservice center [facility]. She stated she called patient to quote price for procedure of $450. Patient advised her she was a screening. Nicholle stated the code used [id number] was not for screening. I advised her that is the code we use for our TCS's procedure code and patient diagnosis is for screening colonoscopy. Nothing further needed

## 2018-10-14 ENCOUNTER — Encounter (HOSPITAL_COMMUNITY)
Admission: RE | Admit: 2018-10-14 | Discharge: 2018-10-14 | Disposition: A | Discharge status: Home / Self Care | Payer: 59 | Source: Ambulatory Visit | Attending: Gastroenterology | Admitting: Gastroenterology

## 2018-10-14 ENCOUNTER — Encounter (HOSPITAL_COMMUNITY): Payer: Self-pay

## 2018-10-14 ENCOUNTER — Telehealth: Payer: Self-pay | Admitting: Gastroenterology

## 2018-10-14 ENCOUNTER — Other Ambulatory Visit: Payer: Self-pay

## 2018-10-14 ENCOUNTER — Other Ambulatory Visit (HOSPITAL_COMMUNITY)
Admission: RE | Admit: 2018-10-14 | Discharge: 2018-10-14 | Disposition: A | Discharge status: Home / Self Care | Payer: 59 | Source: Ambulatory Visit | Attending: Gastroenterology | Admitting: Gastroenterology

## 2018-10-14 DIAGNOSIS — Z01812 Encounter for preprocedural laboratory examination: Secondary | ICD-10-CM | POA: Insufficient documentation

## 2018-10-14 DIAGNOSIS — Z20828 Contact with and (suspected) exposure to other viral communicable diseases: Secondary | ICD-10-CM | POA: Diagnosis not present

## 2018-10-14 LAB — BASIC METABOLIC PANEL
Anion gap: 11 (ref 5–15)
BUN: 25 mg/dL — ABNORMAL HIGH (ref 8–23)
CO2: 30 mmol/L (ref 22–32)
Calcium: 9.3 mg/dL (ref 8.9–10.3)
Chloride: 99 mmol/L (ref 98–111)
Creatinine, Ser: 0.76 mg/dL (ref 0.44–1.00)
GFR calc Af Amer: >60 mL/min (ref >60)
GFR calc non Af Amer: >60 mL/min (ref >60)
Glucose, Bld: 97 mg/dL (ref 70–99)
Potassium: 3.2 mmol/L — ABNORMAL LOW (ref 3.5–5.1)
Sodium: 140 mmol/L (ref 135–145)

## 2018-10-14 LAB — SARS CORONAVIRUS 2 (TAT 6-24 HRS): SARS Coronavirus 2: NEGATIVE

## 2018-10-14 MED ORDER — POTASSIUM CHLORIDE ER 20 MEQ PO TBCR: EXTENDED_RELEASE_TABLET | ORAL | 0 refills | Status: DC

## 2018-10-14 NOTE — Telephone Encounter (Signed)
PLEASE CALL PT. SHE HAS A LOW POTASSIUM DUE TO HYDROCHLOROTHIAZIDE(HCTZ). SHE SHOULD PICK  UP POTASSIUM PILLS TODAY. HOLD HCTZ THE DAY BEFORE AND DAY OF YOUR COLONOSCOPY.

## 2018-10-18 ENCOUNTER — Ambulatory Visit (HOSPITAL_COMMUNITY): Payer: 59 | Admitting: Anesthesiology

## 2018-10-18 ENCOUNTER — Ambulatory Visit (HOSPITAL_COMMUNITY)
Admission: RE | Admit: 2018-10-18 | Discharge: 2018-10-18 | Disposition: A | Discharge status: Home / Self Care | Payer: 59 | Attending: Gastroenterology | Admitting: Gastroenterology

## 2018-10-18 ENCOUNTER — Encounter (HOSPITAL_COMMUNITY)
Admission: RE | Disposition: A | Discharge status: Home / Self Care | Payer: Self-pay | Source: Home / Self Care | Attending: Gastroenterology

## 2018-10-18 ENCOUNTER — Encounter (HOSPITAL_COMMUNITY): Payer: Self-pay | Admitting: *Deleted

## 2018-10-18 DIAGNOSIS — Z8249 Family history of ischemic heart disease and other diseases of the circulatory system: Secondary | ICD-10-CM | POA: Diagnosis not present

## 2018-10-18 DIAGNOSIS — Z853 Personal history of malignant neoplasm of breast: Secondary | ICD-10-CM | POA: Diagnosis not present

## 2018-10-18 DIAGNOSIS — K5730 Diverticulosis of large intestine without perforation or abscess without bleeding: Secondary | ICD-10-CM | POA: Insufficient documentation

## 2018-10-18 DIAGNOSIS — G629 Polyneuropathy, unspecified: Secondary | ICD-10-CM | POA: Diagnosis not present

## 2018-10-18 DIAGNOSIS — Z1211 Encounter for screening for malignant neoplasm of colon: Secondary | ICD-10-CM | POA: Diagnosis not present

## 2018-10-18 DIAGNOSIS — I10 Essential (primary) hypertension: Secondary | ICD-10-CM | POA: Diagnosis not present

## 2018-10-18 DIAGNOSIS — Z86718 Personal history of other venous thrombosis and embolism: Secondary | ICD-10-CM | POA: Insufficient documentation

## 2018-10-18 DIAGNOSIS — K219 Gastro-esophageal reflux disease without esophagitis: Secondary | ICD-10-CM | POA: Diagnosis not present

## 2018-10-18 DIAGNOSIS — F419 Anxiety disorder, unspecified: Secondary | ICD-10-CM | POA: Insufficient documentation

## 2018-10-18 DIAGNOSIS — K644 Residual hemorrhoidal skin tags: Secondary | ICD-10-CM | POA: Diagnosis not present

## 2018-10-18 DIAGNOSIS — Z87891 Personal history of nicotine dependence: Secondary | ICD-10-CM | POA: Diagnosis not present

## 2018-10-18 DIAGNOSIS — M1990 Unspecified osteoarthritis, unspecified site: Secondary | ICD-10-CM | POA: Diagnosis not present

## 2018-10-18 DIAGNOSIS — Z833 Family history of diabetes mellitus: Secondary | ICD-10-CM | POA: Insufficient documentation

## 2018-10-18 DIAGNOSIS — Z791 Long term (current) use of non-steroidal anti-inflammatories (NSAID): Secondary | ICD-10-CM | POA: Diagnosis not present

## 2018-10-18 DIAGNOSIS — Z801 Family history of malignant neoplasm of trachea, bronchus and lung: Secondary | ICD-10-CM | POA: Insufficient documentation

## 2018-10-18 DIAGNOSIS — Z79899 Other long term (current) drug therapy: Secondary | ICD-10-CM | POA: Diagnosis not present

## 2018-10-18 DIAGNOSIS — Z880 Allergy status to penicillin: Secondary | ICD-10-CM | POA: Insufficient documentation

## 2018-10-18 DIAGNOSIS — Z91040 Latex allergy status: Secondary | ICD-10-CM | POA: Insufficient documentation

## 2018-10-18 DIAGNOSIS — M810 Age-related osteoporosis without current pathological fracture: Secondary | ICD-10-CM | POA: Diagnosis not present

## 2018-10-18 DIAGNOSIS — Z8711 Personal history of peptic ulcer disease: Secondary | ICD-10-CM | POA: Diagnosis not present

## 2018-10-18 DIAGNOSIS — Z86010 Personal history of colonic polyps: Secondary | ICD-10-CM | POA: Insufficient documentation

## 2018-10-18 DIAGNOSIS — G709 Myoneural disorder, unspecified: Secondary | ICD-10-CM | POA: Diagnosis not present

## 2018-10-18 DIAGNOSIS — Q438 Other specified congenital malformations of intestine: Secondary | ICD-10-CM | POA: Insufficient documentation

## 2018-10-18 DIAGNOSIS — M8580 Other specified disorders of bone density and structure, unspecified site: Secondary | ICD-10-CM | POA: Diagnosis not present

## 2018-10-18 DIAGNOSIS — Z7982 Long term (current) use of aspirin: Secondary | ICD-10-CM | POA: Insufficient documentation

## 2018-10-18 DIAGNOSIS — Z808 Family history of malignant neoplasm of other organs or systems: Secondary | ICD-10-CM | POA: Insufficient documentation

## 2018-10-18 DIAGNOSIS — K648 Other hemorrhoids: Secondary | ICD-10-CM | POA: Diagnosis not present

## 2018-10-18 DIAGNOSIS — Z96652 Presence of left artificial knee joint: Secondary | ICD-10-CM | POA: Insufficient documentation

## 2018-10-18 DIAGNOSIS — Z803 Family history of malignant neoplasm of breast: Secondary | ICD-10-CM | POA: Diagnosis not present

## 2018-10-18 DIAGNOSIS — Z888 Allergy status to other drugs, medicaments and biological substances status: Secondary | ICD-10-CM | POA: Insufficient documentation

## 2018-10-18 DIAGNOSIS — Z800 Family history of malignant neoplasm of digestive organs: Secondary | ICD-10-CM | POA: Insufficient documentation

## 2018-10-18 HISTORY — PX: COLONOSCOPY WITH PROPOFOL: SHX5780

## 2018-10-18 SURGERY — COLONOSCOPY WITH PROPOFOL
Anesthesia: General

## 2018-10-18 MED ORDER — LIDOCAINE HCL (CARDIAC) PF 100 MG/5ML IV SOSY
PREFILLED_SYRINGE | INTRAVENOUS | Status: DC | PRN
  Administered 2018-10-18: 50 mg via INTRAVENOUS

## 2018-10-18 MED ORDER — PROPOFOL 500 MG/50ML IV EMUL
INTRAVENOUS | Status: DC | PRN
  Administered 2018-10-18: 100 ug/kg/min via INTRAVENOUS

## 2018-10-18 MED ORDER — CHLORHEXIDINE GLUCONATE CLOTH 2 % EX PADS: 6.0000 | MEDICATED_PAD | Freq: Once | CUTANEOUS | Status: DC

## 2018-10-18 MED ORDER — PROPOFOL 10 MG/ML IV BOLUS
INTRAVENOUS | Status: AC
  Filled 2018-10-18: qty 40

## 2018-10-18 MED ORDER — PROMETHAZINE HCL 25 MG/ML IJ SOLN: 6.2500 mg | INTRAMUSCULAR | Status: DC | PRN

## 2018-10-18 MED ORDER — EPHEDRINE SULFATE 50 MG/ML IJ SOLN
INTRAMUSCULAR | Status: DC | PRN
  Administered 2018-10-18 (×2): 5 mg via INTRAVENOUS

## 2018-10-18 MED ORDER — PROPOFOL 10 MG/ML IV BOLUS
INTRAVENOUS | Status: DC | PRN
  Administered 2018-10-18 (×2): 50 mg via INTRAVENOUS
  Administered 2018-10-18: 100 mg via INTRAVENOUS

## 2018-10-18 MED ORDER — MIDAZOLAM HCL 2 MG/2ML IJ SOLN: 0.5000 mg | Freq: Once | INTRAMUSCULAR | Status: DC | PRN

## 2018-10-18 MED ORDER — HYDROMORPHONE HCL 1 MG/ML IJ SOLN: 0.2500 mg | INTRAMUSCULAR | Status: DC | PRN

## 2018-10-18 MED ORDER — LACTATED RINGERS IV SOLN
INTRAVENOUS | Status: DC
  Administered 2018-10-18: 12:00:00 1000 mL via INTRAVENOUS

## 2018-10-18 MED ORDER — HYDROCODONE-ACETAMINOPHEN 7.5-325 MG PO TABS: 1.0000 | ORAL_TABLET | Freq: Once | ORAL | Status: DC | PRN

## 2018-10-18 NOTE — H&P (Signed)
Primary Care Physician:  Redmond School, MD Primary Gastroenterologist:  Dr. Oneida Alar  Pre-Procedure History & Physical: HPI:  Claudia Murphy is a 63 y.o. female here for  PERSONAL HISTORY OF POLYPS.  Past Medical History:  Diagnosis Date  . Anxiety   . Arthritis    "all over my body" (01/25/2012)  . Breast cancer ([place])    "left" (01/25/2012)  . Breast disorder    breast left  . Carpal tunnel syndrome on both sides   . Cataract mature, total senile    "both" (01/25/2012)  . Cataracts, bilateral   . Chronic lower back pain   . DJD (degenerative joint disease)    "neck, shoulders, back" (01/25/2012)  . Family history of breast cancer   . Family history of stomach cancer    ?niece? patient unsure exactly what niece had  . GERD (gastroesophageal reflux disease)   . History of stomach ulcers ` 1990  . Hypertension   . 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 Left knee DJD   . Osteopenia   . Osteoporosis   . Osteoporosis   . Pain of right shoulder joint on movement 01/04/2015  . Peripheral neuropathy    "not diabetic" (01/25/2012)  . Pneumonia 1978   "when my son was born" (01/25/2012)  . PONV (postoperative nausea and vomiting)   . Sebaceous cyst 01/04/2015    Past Surgical History:  Procedure Laterality Date  . BREAST BIOPSY  2006   "left" (01/25/2012)  . COLONOSCOPY  2008   anal papilla and hemorrhoids, otherwise normal rectum, sigmoid diverticula, normal TI and remainder of colonic mucosa. Repeat screening colonoscopy in 5-10 years.  [facility] FOOT SURGERY    . INJECTION KNEE  2013   lt  . KNEE ARTHROSCOPY  1980's   "left" (01/25/2012)  . MASTECTOMY  01/07   left  . PARTIAL KNEE ARTHROPLASTY  01/25/2012   Procedure: UNICOMPARTMENTAL KNEE;  Surgeon: Lorn Junes, MD;  Location: [facility];  Service: Orthopedics;  Laterality: Left;  LEFT KNEE UNICOMPARTMENTAL ARTHROPLASTY   . PERIPHERALLY INSERTED CENTRAL CATHETER INSERTION  2007  . PLANTAR FASCIA SURGERY   11/25/2010   "left" (01/25/2012)  . REPLACEMENT UNICONDYLAR JOINT KNEE  01/25/2012   "left" (01/25/2012)  . THROMBECTOMY / EMBOLECTOMY SUBCLAVIAN ARTERY  2007   "post PICC line removal" (01/25/2012)  . TUBAL LIGATION  1970's    Prior to Admission medications   Medication Sig Start Date End Date Taking? Authorizing Provider  aspirin EC 81 MG tablet Take 81 mg by mouth daily.   Yes [provider]  calcium carbonate (OS-CAL - DOSED IN MG OF ELEMENTAL CALCIUM) 1250 (500 Ca) MG tablet Take 2 tablets by mouth 2 (two) times daily.    Yes [provider]  diclofenac (VOLTAREN) 75 MG EC tablet Take 75 mg by mouth 2 (two) times a day.  05/29/18  Yes [provider]  docusate sodium (COLACE) 100 MG capsule Take 100 mg by mouth daily.   Yes [provider]  gabapentin (NEURONTIN) 400 MG capsule Take 400 mg by mouth 3 (three) times daily.  12/12/14  Yes [provider]  hydrochlorothiazide (HYDRODIURIL) 25 MG tablet Take 1 tablet (25 mg total) by mouth daily. Patient taking differently: Take 25 mg by mouth every evening.  02/07/18  Yes Estill Dooms, NP  HYDROcodone-acetaminophen (NORCO) 10-325 MG tablet Take 1 tablet by mouth 3 (three) times daily as needed (pain).   Yes [provider]  losartan (COZAAR) 25 MG tablet Take 1 tablet (25 mg total) by mouth daily. Patient taking differently: Take 25 mg by mouth every evening.  02/07/18  Yes Estill Dooms, NP  lubiprostone (AMITIZA) 24 MCG capsule Take 1 capsule (24 mcg total) by mouth 2 (two) times daily with a meal. Patient taking differently: Take 24 mcg by mouth 2 (two) times daily as needed for constipation.  06/21/18  Yes Annitta Needs, NP  naproxen sodium (ALEVE) 220 MG tablet Take 440 mg by mouth 2 (two) times daily as needed (pain).   Yes [provider]  Potassium Chloride ER 20 MEQ TBCR 2 PO BID FOR 3 DAYS 10/14/18  Yes Trany Chernick L, MD  SUPREP BOWEL PREP KIT 17.5-3.13-1.6  GM/177ML SOLN Take 354 mLs by mouth once. 09/16/18  Yes [provider]    Allergies as of 07/20/2018 - Review Complete 06/21/2018  Allergen Reaction Noted  . Epirubicin Other (See Comments) 08/13/2010  . Latex Other (See Comments) 01/15/2012  . Methocarbamol Shortness Of Breath 12/23/2011  . Penicillins Rash and Other (See Comments) 07/30/2010    Family History  Problem Relation Age of Onset  . Diabetes Mother   . Hypertension Mother   . Heart disease Father   . Hypertension Father   . Diabetes Son   . Diabetes Sister   . Thyroid disease Sister   . Cancer Sister        lung and brain  . Diabetes Sister   . Diabetes Sister   . Breast cancer Sister 77  . Diabetes Sister   . COPD Brother   . Hypertension Brother   . Breast cancer Maternal Aunt 48  . Stomach cancer Maternal Uncle   . Cancer Other 35       possible uterine cancer  . Colon cancer Neg Hx     Social History   Socioeconomic History  . Marital status: Married    Spouse name: Not on file  . Number of children: 1  . Years of education: Not on file  . Highest education level: Not on file  Occupational History  . Not on file  Social Needs  . Financial resource strain: Not on file  . Food insecurity    Worry: Not on file    Inability: Not on file  . Transportation needs    Medical: Not on file    Non-medical: Not on file  Tobacco Use  . Smoking status: Former Smoker    Packs/day: 1.50    Years: 33.00    Pack years: 49.50    Types: Cigarettes    Quit date: 11/16/2004    Years since quitting: 13.9  . Smokeless tobacco: Never Used  Substance and Sexual Activity  . Alcohol use: No  . Drug use: No  . Sexual activity: Yes    Birth control/protection: Post-menopausal  Lifestyle  . Physical activity    Days per week: Not on file    Minutes per session: Not on file  . Stress: Not on file  Relationships  . Social Herbalist on phone: Not on file    Gets together: Not on file     Attends religious service: Not on file    Active member of club or organization: Not on file    Attends meetings of clubs or organizations: Not on file    Relationship status: Not on file  . Intimate partner violence    Fear of current or ex partner: Not  on file    Emotionally abused: Not on file    Physically abused: Not on file    Forced sexual activity: Not on file  Other Topics Concern  . Not on file  Social History Narrative  . Not on file    Review of Systems: See HPI, otherwise negative ROS   Physical Exam: BP 133/82   Temp 98.3 F (36.8 C) (Oral)   Resp 18   SpO2 99%  General:   Alert,  pleasant and cooperative in NAD Head:  Normocephalic and atraumatic. Neck:  Supple; Lungs:  Clear throughout to auscultation.    Heart:  Regular rate and rhythm. Abdomen:  Soft, nontender and nondistended. Normal bowel sounds, without guarding, and without rebound.   Neurologic:  Alert and  oriented x4;  grossly normal neurologically.  Impression/Plan:      PERSONAL HISTORY OF POLYPS.  PLAN: 1. TCS TODAY. DISCUSSED PROCEDURE, BENEFITS, & RISKS: < 1% chance of medication reaction, bleeding, perforation, ASPIRATION, or rupture of spleen/liver requiring surgery to fix it and missed polyps < 1 cm 10-20% of the time.

## 2018-10-18 NOTE — Transfer of Care (Signed)
Immediate Anesthesia Transfer of Care Note  Patient: Claudia Murphy  Procedure(s) Performed: COLONOSCOPY WITH PROPOFOL (N/A )  Patient Location: PACU  Anesthesia Type:MAC  Level of Consciousness: awake, alert  and oriented  Airway & Oxygen Therapy: Patient Spontanous Breathing and Patient connected to nasal cannula oxygen  Post-op Assessment: Report given to RN, Post -op Vital signs reviewed and stable and Patient moving all extremities X 4  Post vital signs: Reviewed and stable  Last Vitals:  Vitals Value Taken Time  BP 131/77 10/18/18 1241  Temp    Pulse 110 10/18/18 1241  Resp 16 10/18/18 1242  SpO2 100 % 10/18/18 1241  Vitals shown include unvalidated device data.  Last Pain:  Vitals:   10/18/18 1210  TempSrc:   PainSc: 0-No pain      Patients Stated Pain Goal: 7 (76/73/41 [id number])  Complications: No apparent anesthesia complications

## 2018-10-18 NOTE — Anesthesia Postprocedure Evaluation (Signed)
Anesthesia Post Note  Patient: Claudia Murphy  Procedure(s) Performed: COLONOSCOPY WITH PROPOFOL (N/A )  Patient location during evaluation: Phase II Anesthesia Type: General Level of consciousness: awake and alert and oriented Pain management: pain level controlled Vital Signs Assessment: post-procedure vital signs reviewed and stable Respiratory status: spontaneous breathing Anesthetic complications: no     Last Vitals:  Vitals:   10/18/18 1300 10/18/18 1308  BP:  133/75  Pulse: (!) 103 100  Resp: 10 16  Temp:  36.6 C  SpO2: 97% 95%    Last Pain:  Vitals:   10/18/18 1308  TempSrc: Oral  PainSc: 0-No pain                 ADAMS, AMY A

## 2018-10-18 NOTE — Op Note (Signed)
[facility] Patient Name: Claudia Murphy Procedure Date: 10/18/2018 11:40 AM MRN: [id number] Date of Birth: [birth year] Attending MD: Barney Drain MD, MD CSN: [id number] Age: 63 Admit Type: Outpatient Procedure:                Colonoscopy, SCREENING Indications:              Personal history of colonic polyps Providers:                Barney Drain MD, MD, Otis Peak B. Sharon Seller, RN,                            Randa Spike, Technician Referring MD:             Redmond School, MD Medicines:                Propofol per Anesthesia Complications:            No immediate complications. Estimated Blood Loss:     Estimated blood loss: none. Procedure:                Pre-Anesthesia Assessment:                           - Prior to the procedure, a History and Physical                            was performed, and patient medications and                            allergies were reviewed. The patient's tolerance of                            previous anesthesia was also reviewed. The risks                            and benefits of the procedure and the sedation                            options and risks were discussed with the patient.                            All questions were answered, and informed consent                            was obtained. Prior Anticoagulants: The patient has                            taken no previous anticoagulant or antiplatelet                            agents except for aspirin. ASA Grade Assessment: II                            - A patient with mild systemic disease. After  reviewing the risks and benefits, the patient was                            deemed in satisfactory condition to undergo the                            procedure. After obtaining informed consent, the                            colonoscope was passed under direct vision.                            Throughout the procedure, the patient's blood             pressure, pulse, and oxygen saturations were                            monitored continuously. The [id number]-[id number]                            scope was introduced through the anus and advanced                            to the the cecum, identified by appendiceal orifice                            and ileocecal valve. The colonoscopy was                            technically difficult and complex due to                            significant looping and a tortuous colon.                            Successful completion of the procedure was aided by                            straightening and shortening the scope to obtain                            bowel loop reduction and COLOWRAP. The patient                            tolerated the procedure well. The quality of the                            bowel preparation was excellent. The ileocecal                            valve, appendiceal orifice, and rectum were                            photographed. Scope In: 12:17:01 PM Scope Out: 12:33:47 PM  Scope Withdrawal Time: 0 hours 11 minutes 20 seconds  Total Procedure Duration: 0 hours 16 minutes 46 seconds  Findings:      Multiple small and large-mouthed diverticula were found in the       recto-sigmoid colon, sigmoid colon and descending colon.      External and internal hemorrhoids were found.      The recto-sigmoid colon, sigmoid colon and descending colon were       moderately tortuous. Impression:               - Diverticulosis in the recto-sigmoid colon, in the                            sigmoid colon and in the descending colon.                           - External and internal hemorrhoids.                           - Tortuous colon. Moderate Sedation:      Per Anesthesia Care Recommendation:           - Patient has a contact number available for                            emergencies. The signs and symptoms of potential                            delayed  complications were discussed with the                            patient. Return to normal activities tomorrow.                            Written discharge instructions were provided to the                            patient.                           - High fiber diet.                           - Continue present medications.                           - Await pathology results.                           - Repeat colonoscopy in 10 years for surveillance. Procedure Code(s):        --- Professional ---                           [phone], Colonoscopy, flexible; diagnostic, including                            collection of specimen(s) by brushing or washing,  when performed (separate procedure) Diagnosis Code(s):        --- Professional ---                           K64.8, Other hemorrhoids                           Z86.010, Personal history of colonic polyps                           K57.30, Diverticulosis of large intestine without                            perforation or abscess without bleeding                           Q43.8, Other specified congenital malformations of                            intestine CPT copyright 2019 American Medical Association. All rights reserved. The codes documented in this report are preliminary and upon coder review may  be revised to meet current compliance requirements. Barney Drain, MD Barney Drain MD, MD 10/18/2018 12:56:39 PM This report has been signed electronically. Number of Addenda: 0

## 2018-10-18 NOTE — Anesthesia Preprocedure Evaluation (Signed)
Anesthesia Evaluation  Patient identified by MRN, date of birth, ID band Patient awake    Reviewed: Allergy & Precautions, NPO status , Patient's Chart, lab work & pertinent test results  History of Anesthesia Complications (+) PONV  Airway Mallampati: II  TM Distance: >3 FB Neck ROM: Full    Dental no notable dental hx. (+) Teeth Intact   Pulmonary pneumonia, resolved, former smoker,  States 1978   Pulmonary exam normal breath sounds clear to auscultation       Cardiovascular Exercise Tolerance: Good hypertension, Pt. on medications negative cardio ROS Normal cardiovascular examI Rhythm:Regular Rate:Normal     Neuro/Psych Anxiety  Neuromuscular disease negative psych ROS   GI/Hepatic negative GI ROS, Neg liver ROS, neg GERD  ,Denies any GERD or meds at this time    Endo/Other  negative endocrine ROS  Renal/GU negative Renal ROS  negative genitourinary   Musculoskeletal  (+) Arthritis , Osteoarthritis,  States has back issues   Abdominal   Peds negative pediatric ROS (+)  Hematology negative hematology ROS (+)   Anesthesia Other Findings H/o breast L  CA - s/p mastectomy -last CT 2007  Reproductive/Obstetrics negative OB ROS                             Anesthesia Physical Anesthesia Plan  ASA: II  Anesthesia Plan: General   Post-op Pain Management:    Induction: Intravenous  PONV Risk Score and Plan: Treatment may vary due to age or medical condition, Propofol infusion, TIVA, Midazolam and Ondansetron  Airway Management Planned: Nasal Cannula and Simple Face Mask  Additional Equipment:   Intra-op Plan:   Post-operative Plan:   Informed Consent: I have reviewed the patients History and Physical, chart, labs and discussed the procedure including the risks, benefits and alternatives for the proposed anesthesia with the patient or authorized representative who has indicated  his/her understanding and acceptance.     Dental advisory given  Plan Discussed with: CRNA  Anesthesia Plan Comments: (Plan Full PPE use  Plan GA with GETA as needed d/w pt -WTP with same after Q&A  Requests  meds for nausea)        Anesthesia Quick Evaluation

## 2018-10-18 NOTE — Discharge Instructions (Signed)
You DID NOT HAVE ANY POLYPS. YOU HAVE DIVERTICULOSIS IN YOUR LEFT COLON. You have internal AND EXTERNAL hemorrhoids.   EAT TO LIVE AND THINK OF FOOD AS MEDICINE.  DRINK WATER TO KEEP YOUR URINE LIGHT YELLOW.  To have more energy and to lose weight:      1. CONTINUE YOUR WEIGHT LOSS EFFORTS. I RECOMMEND YOU READ AND FOLLOW RECOMMENDATIONS BY DR. MARK HYMAN, "10-DAY DETOX DIET".    2. If you must eat bread, EAT EZEKIEL BREAD. IT IS IN THE FROZEN SECTION OF THE GROCERY STORE.    3. Do not drink SODA, GATORADE, ENERGY DRINKS, OR DIET SODA.     4. AVOID HIGH FRUCTOSE CORN SYRUP.     5. DO NOT chew SUGAR FREE GUM OR USE ARTIFICIAL SWEETENERS. USE STEVIA AS A SWEETENER.    6. DO NOT EAT ENRICHED WHEAT FLOUR, PASTA, RICE, OR CEREAL.    7. ONLY EAT WILD CAUGHT SEAFOOD, GRASS FED BEEF OR CHICKEN, PASTURE RAISED PORK,  OR EGGS FROM PASTURE RAISED CHICKENS.    8. PRACTICE CHAIR YOGA FOR 15-30 MINS 3 OR 4 TIMES A WEEK AND PROGRESS TO HATHA YOGA OVER NEXT 6 MOS.    9. START TAKING MULTIVITAMIN, VITAMIN B12, AND VITAMIN D3 2000 IU DAILY.   Next colonoscopy in 10 years.   Colonoscopy Care After Read the instructions outlined below and refer to this sheet in the next week. These discharge instructions provide you with general information on caring for yourself after you leave the hospital. While your treatment has been planned according to the most current medical practices available, unavoidable complications occasionally occur. If you have any problems or questions after discharge, call DR. Amiir Heckard, [phone].  ACTIVITY  You may resume your regular activity, but move at a slower pace for the next 24 hours.   Take frequent rest periods for the next 24 hours.   Walking will help get rid of the air and reduce the bloated feeling in your belly (abdomen).   No driving for 24 hours (because of the medicine (anesthesia) used during the test).   You may shower.   Do not sign any important  legal documents or operate any machinery for 24 hours (because of the anesthesia used during the test).    NUTRITION  Drink plenty of fluids.   You may resume your normal diet as instructed by your doctor.   Begin with a light meal and progress to your normal diet. Heavy or fried foods are harder to digest and may make you feel sick to your stomach (nauseated).   Avoid alcoholic beverages for 24 hours or as instructed.    MEDICATIONS  You may resume your normal medications.   WHAT YOU CAN EXPECT TODAY  Some feelings of bloating in the abdomen.   Passage of more gas than usual.   Spotting of blood in your stool or on the toilet paper  .  IF YOU HAD POLYPS REMOVED DURING THE COLONOSCOPY:  Eat a soft diet IF YOU HAVE NAUSEA, BLOATING, ABDOMINAL PAIN, OR VOMITING.    FINDING OUT THE RESULTS OF YOUR TEST Not all test results are available during your visit. DR. Oneida Alar WILL CALL YOU WITHIN 7 DAYS OF YOUR PROCEDUE WITH YOUR RESULTS. Do not assume everything is normal if you have not heard from DR. Lacrystal Barbe IN ONE WEEK, CALL HER OFFICE AT [phone].  SEEK IMMEDIATE MEDICAL ATTENTION AND CALL THE OFFICE: [phone] IF:  You have more than a spotting of blood in your  stool.   Your belly is swollen (abdominal distention).   You are nauseated or vomiting.   You have a temperature over 101F.   You have abdominal pain or discomfort that is severe or gets worse throughout the day.    High-Fiber Diet A high-fiber diet changes your normal diet to include more whole grains, legumes, fruits, and vegetables. Changes in the diet involve replacing refined carbohydrates with unrefined foods. The calorie level of the diet is essentially unchanged. The Dietary Reference Intake (recommended amount) for adult males is 38 grams per day. For adult females, it is 25 grams per day. Pregnant and lactating women should consume 28 grams of fiber per day. Fiber is the intact part of a plant that  is not broken down during digestion. Functional fiber is fiber that has been isolated from the plant to provide a beneficial effect in the body.  PURPOSE  Increase stool bulk.   Ease and regulate bowel movements.   Lower cholesterol.   REDUCE RISK OF COLON CANCER  INDICATIONS THAT YOU NEED MORE FIBER  Constipation and hemorrhoids.   Uncomplicated diverticulosis (intestine condition) and irritable bowel syndrome.   Weight management.   As a protective measure against hardening of the arteries (atherosclerosis), diabetes, and cancer.   GUIDELINES FOR INCREASING FIBER IN THE DIET  Start adding fiber to the diet slowly. A gradual increase of about 5 more grams (2 servings of most fruits or vegetables) per day is best. Too rapid an increase in fiber may result in constipation, flatulence, and bloating.   Drink enough water and fluids to keep your urine clear or pale yellow. Water, juice, or caffeine-free drinks are recommended. Not drinking enough fluid may cause constipation.   Eat a variety of high-fiber foods rather than one type of fiber.   Try to increase your intake of fiber through using high-fiber foods rather than fiber pills or supplements that contain small amounts of fiber.   The goal is to change the types of food eaten. Do not supplement your present diet with high-fiber foods, but replace foods in your present diet.    Diverticulosis Diverticulosis is a common condition that develops when small pouches (diverticula) form in the wall of the colon. The risk of diverticulosis increases with age. It happens more often in people who eat a low-fiber diet. Most individuals with diverticulosis have no symptoms. Those individuals with symptoms usually experience belly (abdominal) pain, constipation, or loose stools (diarrhea).  HOME CARE INSTRUCTIONS  Increase the amount of fiber in your diet as directed by your caregiver or dietician. This may reduce symptoms of  diverticulosis.   Drink at least 6 to 8 glasses of water each day to prevent constipation.   Try not to strain when you have a bowel movement.   THERE IS NO NEED TO Avoid nuts and seeds to prevent complications.   FOODS HAVING HIGH FIBER CONTENT INCLUDE:  Fruits. Apple, peach, pear, tangerine, raisins, prunes.   Vegetables. Brussels sprouts, asparagus, broccoli, cabbage, carrot, cauliflower, romaine lettuce, spinach, summer squash, tomato, winter squash, zucchini.   Starchy Vegetables. Baked beans, kidney beans, lima beans, split peas, lentils, potatoes (with skin).

## 2018-10-20 ENCOUNTER — Encounter (HOSPITAL_COMMUNITY): Payer: Self-pay | Admitting: Gastroenterology

## 2018-10-22 ENCOUNTER — Other Ambulatory Visit: Payer: Self-pay | Admitting: Gastroenterology

## 2018-11-17 ENCOUNTER — Telehealth: Payer: Self-pay | Admitting: Gastroenterology

## 2018-11-17 NOTE — Telephone Encounter (Signed)
[phone] PATIENT CALLED STATING [facility] TOLD HER TO COME BACK TO THE OFFICE AND RETRIEVE HER 35$ COPAY BECAUSE SHE WAS NOT SEEN IN THE OFFICE BEFORE HER PROCEDURE?  ALL I COULD SEE WAS A VIRTUAL SHE HAD WITH ANNA IN MAY AND NO COPAY WAS COLLECTED.  PLEASE HELP

## 2018-11-17 NOTE — Telephone Encounter (Signed)
Reviewed

## 2018-11-17 NOTE — Telephone Encounter (Signed)
Called patient and gave her the billing phone number.  She will call them

## 2018-11-17 NOTE — Telephone Encounter (Signed)
Please call the patient it appears she was only seen 06/2018 as a telephone visit prior to her procedure for consult tcs and constipation.  She will need to call the billing department to request for a refund, however she was consulted by Vicente Males prior to her procedure.

## 2019-02-24 ENCOUNTER — Other Ambulatory Visit: Payer: Self-pay | Admitting: Adult Health

## 2019-06-08 ENCOUNTER — Other Ambulatory Visit (HOSPITAL_COMMUNITY): Payer: Self-pay | Admitting: Adult Health

## 2019-06-08 DIAGNOSIS — Z1231 Encounter for screening mammogram for malignant neoplasm of breast: Secondary | ICD-10-CM

## 2019-07-10 ENCOUNTER — Ambulatory Visit (HOSPITAL_COMMUNITY)
Admission: RE | Admit: 2019-07-10 | Discharge: 2019-07-10 | Disposition: A | Discharge status: Home / Self Care | Payer: 59 | Source: Ambulatory Visit | Attending: Adult Health | Admitting: Adult Health

## 2019-07-10 ENCOUNTER — Other Ambulatory Visit: Payer: Self-pay

## 2019-07-10 ENCOUNTER — Other Ambulatory Visit: Payer: Self-pay | Admitting: Otolaryngology

## 2019-07-10 DIAGNOSIS — Z1231 Encounter for screening mammogram for malignant neoplasm of breast: Secondary | ICD-10-CM | POA: Diagnosis not present

## 2019-07-10 DIAGNOSIS — R221 Localized swelling, mass and lump, neck: Secondary | ICD-10-CM

## 2019-07-24 ENCOUNTER — Ambulatory Visit
Admission: RE | Admit: 2019-07-24 | Discharge: 2019-07-24 | Disposition: A | Discharge status: Home / Self Care | Payer: 59 | Source: Ambulatory Visit | Attending: Otolaryngology | Admitting: Otolaryngology

## 2019-07-24 ENCOUNTER — Other Ambulatory Visit: Payer: Self-pay

## 2019-07-24 DIAGNOSIS — R221 Localized swelling, mass and lump, neck: Secondary | ICD-10-CM

## 2019-07-24 MED ORDER — IOPAMIDOL (ISOVUE-300) INJECTION 61%
75.0000 mL | Freq: Once | INTRAVENOUS | Status: AC | PRN
  Administered 2019-07-24: 75 mL via INTRAVENOUS

## 2019-09-06 ENCOUNTER — Encounter: Payer: Self-pay | Admitting: Podiatry

## 2019-09-06 ENCOUNTER — Other Ambulatory Visit: Payer: Self-pay

## 2019-09-06 ENCOUNTER — Ambulatory Visit (INDEPENDENT_AMBULATORY_CARE_PROVIDER_SITE_OTHER): Payer: 59

## 2019-09-06 ENCOUNTER — Other Ambulatory Visit: Payer: Self-pay | Admitting: Podiatry

## 2019-09-06 ENCOUNTER — Ambulatory Visit: Payer: 59 | Admitting: Podiatry

## 2019-09-06 VITALS — Temp 97.7°F

## 2019-09-06 DIAGNOSIS — M7672 Peroneal tendinitis, left leg: Secondary | ICD-10-CM

## 2019-09-06 DIAGNOSIS — M79672 Pain in left foot: Secondary | ICD-10-CM | POA: Diagnosis not present

## 2019-09-06 MED ORDER — DICLOFENAC SODIUM 75 MG PO TBEC: 75.0000 mg | DELAYED_RELEASE_TABLET | Freq: Two times a day (BID) | ORAL | 2 refills | Status: DC

## 2019-09-06 NOTE — Progress Notes (Signed)
   Subjective:    Patient ID: Claudia Murphy, female    DOB: [birth year], 64 y.o.   MRN: [id number]  HPI    Review of Systems  All other systems reviewed and are negative.      Objective:   Physical Exam        Assessment & Plan:

## 2019-09-11 ENCOUNTER — Ambulatory Visit: Payer: 59 | Admitting: Podiatry

## 2019-09-11 NOTE — Progress Notes (Signed)
Subjective:   Patient ID: Claudia Murphy, female   DOB: 64 y.o.   MRN: [id number]   HPI Patient presents stating she gets a lot of pain in the outside of her left foot and she had surgery for heels by me approximately 8 years ago.  Patient stated it is just started in the last 3 weeks and she does try to be active and has increased her activity recently.  Patient does not smoke currently   Review of Systems  All other systems reviewed and are negative.       Objective:  Physical Exam Vitals and nursing note reviewed.  Constitutional:      Appearance: She is well-developed.  Pulmonary:     Effort: Pulmonary effort is normal.  Musculoskeletal:        General: Normal range of motion.  Skin:    General: Skin is warm.  Neurological:     Mental Status: She is alert.     Neurovascular status was found to be intact muscle strength was found to be adequate range of motion within normal limits.  On the outside of the left foot there is inflammation fluid buildup around the peroneal tendon as it inserts into the base of the fifth metatarsal with no indication of tendon damage or muscle strength loss.  Mild discomfort plantar aspect heel not to the same degree     Assessment:  Probability for peroneal tendinitis left with moderate plantar fascial inflammation     Plan:  H&P conditions reviewed and today I did sterile prep and carefully injected the left lateral foot 3 mg Dexasone Kenalog 5 mg Xylocaine applied fascial brace to lift up the lateral foot discussed topical medicines oral medications to take at this point along with shoe gear modifications.  Patient will be seen back to recheck  X-rays indicate that there is no signs of fracture or bony issue associated with this condition

## 2019-09-27 ENCOUNTER — Ambulatory Visit: Payer: 59 | Admitting: Podiatry

## 2019-09-27 ENCOUNTER — Encounter: Payer: Self-pay | Admitting: Podiatry

## 2019-09-27 ENCOUNTER — Other Ambulatory Visit: Payer: Self-pay

## 2019-09-27 DIAGNOSIS — M722 Plantar fascial fibromatosis: Secondary | ICD-10-CM

## 2019-10-02 ENCOUNTER — Other Ambulatory Visit: Payer: 59 | Admitting: Adult Health

## 2019-10-03 NOTE — Progress Notes (Signed)
Subjective:   Patient ID: Claudia Murphy, female   DOB: 64 y.o.   MRN: [id number]   HPI Patient presents stating her heel is somewhat improved but it is still hurting especially on the outside bottom   ROS      Objective:  Physical Exam  Neurovascular status intact with plantar inflammation of the heel lateral side left that is painful when palpated     Assessment:  Planter fasciitis more of the lateral center part of the heel     Plan:  H&P reviewed condition sterile prep done injected the plantar fascial lateral side 3 mg Dexasone Kenalog 5 mg Xylocaine advised on supportive shoes anti-inflammatories and reappoint as needed

## 2019-10-04 ENCOUNTER — Other Ambulatory Visit: Payer: 59 | Admitting: Adult Health

## 2019-11-22 ENCOUNTER — Other Ambulatory Visit: Payer: Self-pay | Admitting: Podiatry

## 2019-11-27 NOTE — Telephone Encounter (Signed)
Please Advise

## 2019-12-01 ENCOUNTER — Other Ambulatory Visit: Payer: Self-pay

## 2019-12-01 ENCOUNTER — Ambulatory Visit (INDEPENDENT_AMBULATORY_CARE_PROVIDER_SITE_OTHER): Payer: 59 | Admitting: Adult Health

## 2019-12-01 ENCOUNTER — Encounter: Payer: Self-pay | Admitting: Adult Health

## 2019-12-01 VITALS — BP 144/79 | HR 93 | Ht 61.0 in | Wt 170.0 lb

## 2019-12-01 DIAGNOSIS — Z853 Personal history of malignant neoplasm of breast: Secondary | ICD-10-CM

## 2019-12-01 DIAGNOSIS — Z01419 Encounter for gynecological examination (general) (routine) without abnormal findings: Secondary | ICD-10-CM | POA: Insufficient documentation

## 2019-12-01 DIAGNOSIS — N3941 Urge incontinence: Secondary | ICD-10-CM

## 2019-12-01 DIAGNOSIS — Z1211 Encounter for screening for malignant neoplasm of colon: Secondary | ICD-10-CM | POA: Insufficient documentation

## 2019-12-01 DIAGNOSIS — R221 Localized swelling, mass and lump, neck: Secondary | ICD-10-CM | POA: Insufficient documentation

## 2019-12-01 DIAGNOSIS — N3281 Overactive bladder: Secondary | ICD-10-CM | POA: Diagnosis not present

## 2019-12-01 LAB — HEMOCCULT GUIAC POC 1CARD (OFFICE): Fecal Occult Blood, POC: NEGATIVE

## 2019-12-01 MED ORDER — GEMTESA 75 MG PO TABS: ORAL_TABLET | ORAL | 0 refills | Status: DC

## 2019-12-01 NOTE — Telephone Encounter (Signed)
She can have for 2 more months. If not improved I need to see back

## 2019-12-01 NOTE — Progress Notes (Signed)
Patient ID: Claudia Murphy, female   DOB: 1955-07-08, 64 y.o.   MRN: [id number] History of Present Illness: Claudia Murphy is a 64 year old white female, married, PM in for a well woman gyn exam, she had a normal pap with negative HPV 07/08/17. PCP is Dr Gerarda Fraction.   Current Medications, Allergies, Past Medical History, Past Surgical History, Family History and Social History were reviewed in [place] record.     Review of Systems: Patient denies any headaches, hearing loss, fatigue, blurred vision, shortness of breath, chest pain, abdominal pain, problems with bowel movements, or intercourse. No joint pain or mood swings. Has frequent urination and urge incontinence at times  Has knot in neck, had negative CT Denies any vaginal bleeding.  Physical Exam:BP (!) 144/79 (BP Location: Right Arm, Patient Position: Sitting, Cuff Size: Normal)    Pulse 93    Ht 5\' 1"  (1.549 m)    Wt 170 lb (77.1 kg)    BMI 32.12 kg/m  General:  Well developed, well nourished, no acute distress Skin:  Warm and dry Neck:  Midline trachea, normal thyroid, good ROM, no lymphadenopathy,has firm nodule right neck about 1 cm, round and smooth and mobile. Lungs; Clear to auscultation bilaterally Breast:  No dominant palpable mass, retraction, or nipple discharge on the right, left is absent. Cardiovascular: Regular rate and rhythm Abdomen:  Soft, non tender, no hepatosplenomegaly Pelvic:  External genitalia is normal in appearance, no lesions.  The vagina is pale with loss of moisture and rugae. Urethra has no lesions or masses. The cervix is smooth.  Uterus is felt to be normal size, shape, and contour.  No adnexal masses or tenderness noted.Bladder is non tender, no masses felt. Rectal: Good sphincter tone, no polyps, or hemorrhoids felt.  Hemoccult negative. Extremities/musculoskeletal:  No swelling or varicosities noted, no clubbing or cyanosis Psych:  No mood changes, alert and cooperative,seems  happy Fall risk is low PHQ 2 score is 2  [facility] - 12/01/19 1322      Pregnancy Intention Screening   Does the patient want to become pregnant in the next year? N/A    Does the patient's partner want to become pregnant in the next year? N/A    Would the patient like to discuss contraceptive options today? N/A      Contraception Wrap Up   Current Method No Method - Other Reason   postmenopausal   End Method No Method - Other Reason   postmenopausal   Contraception Counseling Provided No         Examination chaperoned by Levy Pupa LPN  Impression and Plan: 1. Encounter for well woman exam with routine gynecological exam Pap and physical in 1 year  Mammogram yearly Labs with PCP Colonoscopy per GI   2. Encounter for screening fecal occult blood testing   3. History of breast cancer   4. OAB (overactive bladder) Gave 42 tabs Gemtisa 1 daily to try, if works call me for Rx  5. Urge incontinence   6. Neck nodule Call if changes or grows

## 2020-02-29 ENCOUNTER — Other Ambulatory Visit: Payer: Self-pay | Admitting: Podiatry

## 2020-03-01 NOTE — Telephone Encounter (Signed)
Please advise 

## 2020-03-14 ENCOUNTER — Other Ambulatory Visit: Payer: Self-pay | Admitting: Adult Health

## 2020-05-13 ENCOUNTER — Other Ambulatory Visit: Payer: Self-pay | Admitting: Podiatry

## 2020-06-18 ENCOUNTER — Other Ambulatory Visit (HOSPITAL_COMMUNITY): Payer: Self-pay | Admitting: Adult Health

## 2020-06-18 DIAGNOSIS — Z1231 Encounter for screening mammogram for malignant neoplasm of breast: Secondary | ICD-10-CM

## 2020-06-28 ENCOUNTER — Other Ambulatory Visit: Payer: Self-pay

## 2020-06-28 ENCOUNTER — Ambulatory Visit (HOSPITAL_COMMUNITY)
Admission: RE | Admit: 2020-06-28 | Discharge: 2020-06-28 | Disposition: A | Discharge status: Home / Self Care | Payer: 59 | Source: Ambulatory Visit | Attending: Family Medicine | Admitting: Family Medicine

## 2020-06-28 ENCOUNTER — Other Ambulatory Visit (HOSPITAL_COMMUNITY): Payer: Self-pay | Admitting: Family Medicine

## 2020-06-28 DIAGNOSIS — M2550 Pain in unspecified joint: Secondary | ICD-10-CM

## 2020-07-22 ENCOUNTER — Ambulatory Visit (HOSPITAL_COMMUNITY)
Admission: RE | Admit: 2020-07-22 | Discharge: 2020-07-22 | Disposition: A | Discharge status: Home / Self Care | Payer: 59 | Source: Ambulatory Visit | Attending: Adult Health | Admitting: Adult Health

## 2020-07-22 ENCOUNTER — Other Ambulatory Visit: Payer: Self-pay

## 2020-07-22 DIAGNOSIS — Z1231 Encounter for screening mammogram for malignant neoplasm of breast: Secondary | ICD-10-CM | POA: Diagnosis present

## 2020-07-31 ENCOUNTER — Other Ambulatory Visit: Payer: Self-pay | Admitting: Podiatry

## 2020-07-31 NOTE — Telephone Encounter (Signed)
Please advise 

## 2020-11-01 ENCOUNTER — Other Ambulatory Visit: Payer: Self-pay | Admitting: Podiatry

## 2020-12-24 ENCOUNTER — Ambulatory Visit (INDEPENDENT_AMBULATORY_CARE_PROVIDER_SITE_OTHER): Payer: 59 | Admitting: Adult Health

## 2020-12-24 ENCOUNTER — Other Ambulatory Visit: Payer: Self-pay

## 2020-12-24 ENCOUNTER — Other Ambulatory Visit (HOSPITAL_COMMUNITY)
Admission: RE | Admit: 2020-12-24 | Discharge: 2020-12-24 | Disposition: A | Discharge status: Home / Self Care | Payer: 59 | Source: Ambulatory Visit | Attending: Adult Health | Admitting: Adult Health

## 2020-12-24 ENCOUNTER — Encounter: Payer: Self-pay | Admitting: Adult Health

## 2020-12-24 VITALS — BP 129/81 | HR 93 | Ht 61.0 in | Wt 170.0 lb

## 2020-12-24 DIAGNOSIS — Z01419 Encounter for gynecological examination (general) (routine) without abnormal findings: Secondary | ICD-10-CM

## 2020-12-24 DIAGNOSIS — N3941 Urge incontinence: Secondary | ICD-10-CM | POA: Diagnosis not present

## 2020-12-24 DIAGNOSIS — Z1211 Encounter for screening for malignant neoplasm of colon: Secondary | ICD-10-CM

## 2020-12-24 DIAGNOSIS — I10 Essential (primary) hypertension: Secondary | ICD-10-CM

## 2020-12-24 DIAGNOSIS — Z853 Personal history of malignant neoplasm of breast: Secondary | ICD-10-CM | POA: Diagnosis not present

## 2020-12-24 LAB — HEMOCCULT GUIAC POC 1CARD (OFFICE): Fecal Occult Blood, POC: NEGATIVE

## 2020-12-24 NOTE — Progress Notes (Signed)
Patient ID: Claudia Murphy, female   DOB: [birth year], 65 y.o.   MRN: [id number] History of Present Illness: Claudia Murphy is a 65 year old white female, married, PM in for a well woman gyn exam and pap. PCP is Dr Gerarda Fraction.   Current Medications, Allergies, Past Medical History, Past Surgical History, Family History and Social History were reviewed in [facility] record.     Review of Systems:  Patient denies any headaches, hearing loss, fatigue, blurred vision, shortness of breath, chest pain, abdominal pain, problems with bowel movements, or intercourse. No joint pain or mood swings.  Has urinary incontinence  at times Has pain in shoulders and clavicle area esp in am, seeing chiropractor, for back Right breast hurts at times like sharp stab  Physical Exam:BP 129/81 (BP Location: Right Arm, Patient Position: Sitting, Cuff Size: Normal)   Pulse 93   Ht 5\' 1"  (1.549 m)   Wt 170 lb (77.1 kg)   BMI 32.12 kg/m   General:  Well developed, well nourished, no acute distress Skin:  Warm and dry Neck:  Midline trachea, normal thyroid, good ROM, no lymphadenopathy Lungs; Clear to auscultation bilaterally Breast:  No dominant palpable mass, retraction, or nipple discharge on the right , breast absent on the left. Cardiovascular: Regular rate and rhythm Abdomen:  Soft, non tender, no hepatosplenomegaly Pelvic:  External genitalia is normal in appearance, no lesions.  The vagina is pale. Urethra has no lesions or masses. The cervix is smooth, pap with HR HPV genotyping performed. Uterus is felt to be normal size, shape, and contour.  No adnexal masses or tenderness noted.Bladder is non tender, no masses felt. Rectal: Good sphincter tone, no polyps, or hemorrhoids felt.  Hemoccult negative. Extremities/musculoskeletal:  No swelling or varicosities noted, no clubbing or cyanosis Psych:  No mood changes, alert and cooperative,seems happy AA is never Fall risk is low Depression screen  [facility] 2/9 12/24/2020 12/01/2019 02/07/2018  Decreased Interest 0 0 0  Down, Depressed, Hopeless 0 0 0  PHQ - 2 Score 0 0 0  Altered sleeping 0 - -  Tired, decreased energy 0 - -  Change in appetite 0 - -  Feeling bad or failure about yourself  0 - -  Trouble concentrating 0 - -  Moving slowly or fidgety/restless 0 - -  Suicidal thoughts 0 - -  PHQ-9 Score 0 - -    GAD 7 : Generalized Anxiety Score 12/24/2020  Nervous, Anxious, on Edge 0  Control/stop worrying 0  Worry too much - different things 0  Trouble relaxing 0  Restless 0  Easily annoyed or irritable 0  Afraid - awful might happen 0  Total GAD 7 Score 0    [facility] - 12/24/20 1040       Pregnancy Intention Screening   Does the patient want to become pregnant in the next year? No    Does the patient's partner want to become pregnant in the next year? No    Would the patient like to discuss contraceptive options today? No      Contraception Wrap Up   Current Method Female Sterilization    End Method Female Sterilization    Contraception Counseling Provided No            Examination chaperoned by August Luz NP student .  Impression and Plan: 1. Encounter for gynecological examination with Papanicolaou smear of cervix Pap sent Physical in 1 year Pap in 3 if normal Mammogram yearly Colonoscopy per GI  Labs with PCP - Cytology - PAP( [facility])  2. Encounter for screening fecal occult blood testing  - POCT occult blood stool  3. Urge incontinence So pee, stand up and then sit and pee again   4. History of breast cancer   5. Hypertension, unspecified type On 25 mg losartan has refills

## 2020-12-26 LAB — CYTOLOGY - PAP
Comment: NEGATIVE
Diagnosis: NEGATIVE
High risk HPV: NEGATIVE

## 2021-02-19 ENCOUNTER — Other Ambulatory Visit (HOSPITAL_COMMUNITY): Payer: Self-pay | Admitting: Family Medicine

## 2021-02-19 DIAGNOSIS — R1011 Right upper quadrant pain: Secondary | ICD-10-CM

## 2021-03-19 ENCOUNTER — Other Ambulatory Visit: Payer: Self-pay | Admitting: Adult Health

## 2021-03-19 ENCOUNTER — Ambulatory Visit (INDEPENDENT_AMBULATORY_CARE_PROVIDER_SITE_OTHER): Payer: 59 | Admitting: Podiatry

## 2021-03-19 ENCOUNTER — Encounter: Payer: Self-pay | Admitting: Podiatry

## 2021-03-19 ENCOUNTER — Other Ambulatory Visit: Payer: Self-pay

## 2021-03-19 ENCOUNTER — Ambulatory Visit (INDEPENDENT_AMBULATORY_CARE_PROVIDER_SITE_OTHER): Payer: 59

## 2021-03-19 ENCOUNTER — Other Ambulatory Visit: Payer: Self-pay | Admitting: Podiatry

## 2021-03-19 DIAGNOSIS — M722 Plantar fascial fibromatosis: Secondary | ICD-10-CM

## 2021-03-19 DIAGNOSIS — M79671 Pain in right foot: Secondary | ICD-10-CM

## 2021-03-19 MED ORDER — DICLOFENAC SODIUM 75 MG PO TBEC: 75.0000 mg | DELAYED_RELEASE_TABLET | Freq: Two times a day (BID) | ORAL | 2 refills | Status: DC

## 2021-03-19 MED ORDER — TRIAMCINOLONE ACETONIDE 10 MG/ML IJ SUSP
10.0000 mg | Freq: Once | INTRAMUSCULAR | Status: AC
  Administered 2021-03-19: 10 mg

## 2021-03-20 NOTE — Progress Notes (Signed)
Subjective:   Patient ID: Claudia Murphy, female   DOB: 67 y.o.   MRN: [id number]   HPI Patient states she has developed acute discomfort in the plantar aspect of her right heel at the insertional point calcaneus and does work on cement floors   ROS      Objective:  Physical Exam  Neurovascular status intact exquisite discomfort medial band right fascia at the insertion of the tendon into the calcaneus     Assessment:  Acute plantar fasciitis right with inflammation fluid buildup     Plan:  H&P condition reviewed sterile prep and injected the medial band of the fascia 3 mg Kenalog 5 mg Xylocaine and discussed using brace she has at home.  Discussed possible long-term orthotics depending on results and reappoint 2 weeks  X-rays indicate that there is spur formation no indication stress fracture arthritis

## 2021-04-02 ENCOUNTER — Other Ambulatory Visit: Payer: Self-pay

## 2021-04-02 ENCOUNTER — Ambulatory Visit (INDEPENDENT_AMBULATORY_CARE_PROVIDER_SITE_OTHER): Payer: 59 | Admitting: Podiatry

## 2021-04-02 ENCOUNTER — Encounter: Payer: Self-pay | Admitting: Podiatry

## 2021-04-02 DIAGNOSIS — M722 Plantar fascial fibromatosis: Secondary | ICD-10-CM

## 2021-04-03 NOTE — Progress Notes (Signed)
Subjective:   Patient ID: Claudia Murphy, female   DOB: 66 y.o.   MRN: [id number]   HPI Patient states the right foot is doing better stating the pain is about 80% improved   ROS      Objective:  Physical Exam  Neurovascular status intact with patient's right plantar heel improved pain still present upon deep palpation but better than previous     Assessment:  Improved from acute plantar fasciitis right     Plan:  Recommended the continuation of anti-inflammatories physical therapy support and patient's discharge will be seen back as needed and may require further treatment in future

## 2021-07-21 ENCOUNTER — Ambulatory Visit: Payer: 59 | Admitting: Podiatry

## 2021-07-22 ENCOUNTER — Other Ambulatory Visit (HOSPITAL_COMMUNITY): Payer: Self-pay | Admitting: Adult Health

## 2021-07-22 DIAGNOSIS — Z1231 Encounter for screening mammogram for malignant neoplasm of breast: Secondary | ICD-10-CM

## 2021-07-28 ENCOUNTER — Ambulatory Visit (HOSPITAL_COMMUNITY): Payer: 59

## 2021-08-04 ENCOUNTER — Ambulatory Visit (HOSPITAL_COMMUNITY)
Admission: RE | Admit: 2021-08-04 | Discharge: 2021-08-04 | Disposition: A | Discharge status: Home / Self Care | Payer: 59 | Source: Ambulatory Visit | Attending: Family Medicine | Admitting: Family Medicine

## 2021-08-04 ENCOUNTER — Other Ambulatory Visit (HOSPITAL_COMMUNITY): Payer: Self-pay | Admitting: Family Medicine

## 2021-08-04 ENCOUNTER — Ambulatory Visit (HOSPITAL_COMMUNITY)
Admission: RE | Admit: 2021-08-04 | Discharge: 2021-08-04 | Disposition: A | Discharge status: Home / Self Care | Payer: 59 | Source: Ambulatory Visit | Attending: Adult Health | Admitting: Adult Health

## 2021-08-04 DIAGNOSIS — M85612 Other cyst of bone, left shoulder: Secondary | ICD-10-CM

## 2021-08-04 DIAGNOSIS — Z1231 Encounter for screening mammogram for malignant neoplasm of breast: Secondary | ICD-10-CM | POA: Diagnosis present

## 2021-08-04 DIAGNOSIS — D0592 Unspecified type of carcinoma in situ of left breast: Secondary | ICD-10-CM | POA: Diagnosis not present

## 2021-08-05 ENCOUNTER — Other Ambulatory Visit: Payer: Self-pay | Admitting: Sports Medicine

## 2021-08-05 ENCOUNTER — Encounter: Payer: Self-pay | Admitting: Internal Medicine

## 2021-08-05 DIAGNOSIS — M2540 Effusion, unspecified joint: Secondary | ICD-10-CM

## 2021-08-05 DIAGNOSIS — R079 Chest pain, unspecified: Secondary | ICD-10-CM

## 2021-08-06 ENCOUNTER — Other Ambulatory Visit: Payer: Self-pay | Admitting: Sports Medicine

## 2021-08-06 DIAGNOSIS — M2540 Effusion, unspecified joint: Secondary | ICD-10-CM

## 2021-08-16 ENCOUNTER — Ambulatory Visit
Admission: RE | Admit: 2021-08-16 | Discharge: 2021-08-16 | Disposition: A | Discharge status: Home / Self Care | Payer: 59 | Source: Ambulatory Visit | Attending: Sports Medicine | Admitting: Sports Medicine

## 2021-08-16 DIAGNOSIS — M2540 Effusion, unspecified joint: Secondary | ICD-10-CM

## 2021-08-16 DIAGNOSIS — R079 Chest pain, unspecified: Secondary | ICD-10-CM

## 2021-08-17 ENCOUNTER — Other Ambulatory Visit: Payer: 59

## 2021-08-31 ENCOUNTER — Other Ambulatory Visit: Payer: 59

## 2021-09-14 ENCOUNTER — Other Ambulatory Visit: Payer: Self-pay | Admitting: Podiatry

## 2021-09-17 ENCOUNTER — Ambulatory Visit: Payer: 59 | Admitting: Internal Medicine

## 2021-11-04 ENCOUNTER — Ambulatory Visit (INDEPENDENT_AMBULATORY_CARE_PROVIDER_SITE_OTHER): Payer: 59 | Admitting: Gastroenterology

## 2021-11-04 ENCOUNTER — Encounter: Payer: Self-pay | Admitting: Gastroenterology

## 2021-11-04 VITALS — BP 135/79 | HR 99 | Temp 97.3°F | Ht 61.0 in | Wt 166.0 lb

## 2021-11-04 DIAGNOSIS — R1011 Right upper quadrant pain: Secondary | ICD-10-CM

## 2021-11-04 MED ORDER — PANTOPRAZOLE SODIUM 40 MG PO TBEC: 40.0000 mg | DELAYED_RELEASE_TABLET | Freq: Every day | ORAL | 3 refills | Status: DC

## 2021-11-04 NOTE — Progress Notes (Signed)
Gastroenterology Office Note    Referring Provider: Redmond School, MD Primary Care Physician:  Redmond School, MD  Primary GI: Dr. Abbey Chatters     Chief Complaint   Chief Complaint  Patient presents with   Abdominal Pain    she is here for right upper quadrant pain. Not hurting now but if you mash on it sometimes it will hurt     History of Present Illness   Claudia Murphy is a 66 y.o. female presenting today at the request of Redmond School, MD. The referral states for dysphagia, but the patient denies this. She notes RUQ abdominal pain. Patient as last seen virtually during Covid in 2020. Colonoscopy up-to-date as of 2020 without polyps.   She notes RUQ abdominal pain for at least 6 months that is intermittent. Waxes and wanes in intensity. Denies postprandial abdominal pain. Some nausea intermittently but no vomiting. States she has a good appetite. No dysphagia. Sometimes movement worsens discomfort. Notes GERD. NO PPI. Occasional Goody powders. Occasional Aleve. Wants to avoid EGD if possible. Thinks she lost weight since January due to family issues but stable now.   US abdomen complete in Nov 2022 without acute findings but did show a 2 cm left renal cyst.   Past Medical History:  Diagnosis Date   Anxiety    Arthritis    "all over my body" (01/25/2012)   Breast cancer ([place])    "left" (01/25/2012)   Breast disorder    breast left   Carpal tunnel syndrome on both sides    Cataract mature, total senile    "both" (01/25/2012)   Cataracts, bilateral    Chronic lower back pain    DJD (degenerative joint disease)    "neck, shoulders, back" (01/25/2012)   Family history of breast cancer    Family history of stomach cancer    ?niece? 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 Repeat screening colonoscopy in 5-10 years.   COLONOSCOPY WITH PROPOFOL N/A 10/18/2018   diverticulosis, torturous colon, hemorrhoids.   FOOT SURGERY     INJECTION KNEE  2013   lt   KNEE ARTHROSCOPY  1980's   "left" (01/25/2012)   MASTECTOMY  02/2005   left   PARTIAL KNEE ARTHROPLASTY  01/25/2012   Procedure: UNICOMPARTMENTAL KNEE;  Surgeon: Lorn Junes, MD;  Location: [facility];  Service: Orthopedics;  Laterality: Left;  LEFT KNEE UNICOMPARTMENTAL ARTHROPLASTY    PERIPHERALLY INSERTED CENTRAL CATHETER INSERTION  2007   PLANTAR FASCIA SURGERY  11/25/2010   "left" (01/25/2012)   REPLACEMENT UNICONDYLAR JOINT KNEE  01/25/2012   "left" (01/25/2012)   THROMBECTOMY / EMBOLECTOMY SUBCLAVIAN ARTERY  2007   "post PICC line removal" (01/25/2012)  TUBAL LIGATION  1970's    Current Outpatient Medications  Medication Sig Dispense Refill   aspirin EC 81 MG tablet Take 81 mg by mouth daily.     calcium carbonate (OS-CAL - DOSED IN MG OF ELEMENTAL CALCIUM) 1250 (500 Ca) MG tablet Take 2 tablets by mouth 2 (two) times daily.      diclofenac (VOLTAREN) 75 MG EC tablet Take 1 tablet (75 mg total) by mouth 2 (two) times daily. 50 tablet 2   losartan (COZAAR) 25 MG tablet TAKE 1 TABLET BY MOUTH EVERY DAY 30 tablet PRN   pantoprazole (PROTONIX) 40 MG tablet Take 1 tablet (40 mg total) by mouth daily. 30 minutes before breakfast 90 tablet 3   Probiotic Product (PROBIOTIC PO) Take by mouth.     No  current facility-administered medications for this visit.    Allergies as of 11/04/2021 - Review Complete 11/04/2021  Allergen Reaction Noted   Epirubicin Other (See Comments) 08/13/2010   Latex Other (See Comments) 01/15/2012   Methocarbamol Shortness Of Breath 12/23/2011   Penicillins Rash and Other (See Comments) 07/30/2010   Golytely [peg 3350-electrolytes]  10/18/2018    Family History  Problem Relation Age of Onset   Diabetes Mother    Hypertension Mother    Heart disease Father    Hypertension Father    Diabetes Son    Diabetes Sister    Thyroid disease Sister    Cancer Sister        lung and brain   Diabetes Sister    Diabetes Sister    Breast cancer Sister 58   Diabetes Sister    COPD Brother    Hypertension Brother    Breast cancer Maternal Aunt 48   Stomach cancer Maternal Uncle    Cancer Other 35       possible uterine cancer   Colon cancer Neg Hx     Social History   Socioeconomic History   Marital status: Legally Separated    Spouse name: Not on file   Number of children: 1   Years of education: Not on file   Highest education level: Not on file  Occupational History   Not on file  Tobacco Use   Smoking status: Former    Packs/day: 1.50    Years: 33.00    Total pack years: 49.50    Types: Cigarettes    Quit date: 11/16/2004    Years since quitting: 16.9   Smokeless tobacco: Never  Vaping Use   Vaping Use: Never used  Substance and Sexual Activity   Alcohol use: No   Drug use: No   Sexual activity: Yes    Birth control/protection: Post-menopausal, Surgical    Comment: tubal  Other Topics Concern   Not on file  Social History Narrative   Not on file   Social Determinants of Health   Financial Resource Strain: Low Risk  (12/24/2020)   Overall Financial Resource Strain (CARDIA)    Difficulty of Paying Living Expenses: Not hard at all  Food Insecurity: No Food Insecurity (12/24/2020)   Hunger Vital Sign    Worried About Running Out of  Food in the Last Year: Never true    [place] in the Last Year: Never true  Transportation Needs: No Transportation Needs (12/24/2020)   PRAPARE - Hydrologist (Medical): No    Lack of Transportation (Non-Medical): No  Physical Activity: Inactive (12/24/2020)   Exercise Vital Sign    Days  of Exercise per Week: 0 days    Minutes of Exercise per Session: 0 min  Stress: No Stress Concern Present (12/24/2020)   [place]    Feeling of Stress : Not at all  Social Connections: Unknown (12/24/2020)   Social Connection and Isolation Panel [NHANES]    Frequency of Communication with Friends and Family: Not on file    Frequency of Social Gatherings with Friends and Family: Once a week    Attends Religious Services: Never    Marine scientist or Organizations: No    Attends Archivist Meetings: Never    Marital Status: Married  Human resources officer Violence: Unknown (12/24/2020)   Humiliation, Afraid, Rape, and Kick questionnaire    Fear of Current or Ex-Partner: Not on file    Emotionally Abused: No    Physically Abused: No    Sexually Abused: No     Review of Systems   Gen: Denies any fever, chills, fatigue, weight loss, lack of appetite.  CV: Denies chest pain, heart palpitations, peripheral edema, syncope.  Resp: Denies shortness of breath at rest or with exertion. Denies wheezing or cough.  GI: Denies dysphagia or odynophagia. Denies jaundice, hematemesis, fecal incontinence. GU : Denies urinary burning, urinary frequency, urinary hesitancy MS: Denies joint pain, muscle weakness, cramps, or limitation of movement.  Derm: Denies rash, itching, dry skin Psych: Denies depression, anxiety, memory loss, and confusion Heme: Denies bruising, bleeding, and enlarged lymph nodes.   Physical Exam   BP 135/79   Pulse 99   Temp (!) 97.3 F (36.3 C)   Ht '5\' 1"'$  (1.549 m)   Wt 166  lb (75.3 kg)   BMI 31.37 kg/m  General:   Alert and oriented. Pleasant and cooperative. Well-nourished and well-developed.  Head:  Normocephalic and atraumatic. Eyes:  Without icterus Ears:  Normal auditory acuity. Lungs:  Clear to auscultation bilaterally.  Heart:  S1, S2 present without murmurs appreciated.  Abdomen:  +BS, soft, non-tender and non-distended. No HSM noted. No guarding or rebound. No masses appreciated.  Rectal:  Deferred  Msk:  Symmetrical without gross deformities. Normal posture. Extremities:  Without edema. Neurologic:  Alert and  oriented x4;  grossly normal neurologically. Skin:  Intact without significant lesions or rashes. Psych:  Alert and cooperative. Normal mood and affect.   Assessment   Claudia Murphy is a 66 y.o. female presenting today at the the request of Redmond School, MD. Initially, the referral states for dysphagia, but patient denies this and reports RUQ abdominal pain.  RUQ abdominal pain: no real precipitating or relieving factors. Gallbladder present but no stones on ultrasound. Does not appear to be biliary. She does have some reproduction with movement. Intermittent GERD but no PPI. Wants to hold off on EGD at the moment unless necessary.   Will order CBC, HFP, start PPI. Have her return in close follow-up in 3 months. Call if any postprandial symptoms, weight loss, nausea, vomiting etc.     PLAN   Start pantoprazole once daily  CBC, HFP today  3 month follow-up  Annitta Needs, PhD, ANP-BC [facility]

## 2021-11-04 NOTE — Patient Instructions (Signed)
I have sent in pantoprazole to take once per day, 30 minutes before breakfast.  Please have blood work done today. If no improvement in next 2-4 weeks, please call us!  If any further weight loss, worsening pain, pain with eating, nausea, vomiting, or loss of appetite, please call.   We will see you in 3 months!  It was a pleasure to see you today. I want to create trusting relationships with patients to provide genuine, compassionate, and quality care. I value your feedback. If you receive a survey regarding your visit,  I greatly appreciate you taking time to fill this out.   Annitta Needs, PhD, ANP-BC [facility]

## 2021-11-05 LAB — CBC WITH DIFFERENTIAL/PLATELET
Absolute Monocytes: 797 cells/uL (ref 200–950)
Basophils Absolute: 131 cells/uL (ref 0–200)
Basophils Relative: 1.1 %
Eosinophils Absolute: 405 cells/uL (ref 15–500)
Eosinophils Relative: 3.4 %
HCT: 37.2 % (ref 35.0–45.0)
Hemoglobin: 12.5 g/dL (ref 11.7–15.5)
Lymphs Abs: 2892 cells/uL (ref 850–3900)
MCH: 28.8 pg (ref 27.0–33.0)
MCHC: 33.6 g/dL (ref 32.0–36.0)
MCV: 85.7 fL (ref 80.0–100.0)
MPV: 10.9 fL (ref 7.5–12.5)
Monocytes Relative: 6.7 %
Neutro Abs: 7676 cells/uL (ref 1500–7800)
Neutrophils Relative %: 64.5 %
Platelets: 375 10*3/uL (ref 140–400)
RBC: 4.34 10*6/uL (ref 3.80–5.10)
RDW: 12.4 % (ref 11.0–15.0)
Total Lymphocyte: 24.3 %
WBC: 11.9 10*3/uL — ABNORMAL HIGH (ref 3.8–10.8)

## 2021-11-05 LAB — HEPATIC FUNCTION PANEL
AG Ratio: 1.6 (calc) (ref 1.0–2.5)
ALT: 21 U/L (ref 6–29)
AST: 25 U/L (ref 10–35)
Albumin: 4.3 g/dL (ref 3.6–5.1)
Alkaline phosphatase (APISO): 105 U/L (ref 37–153)
Bilirubin, Direct: 0.1 mg/dL (ref 0.0–0.2)
Globulin: 2.7 g/dL (calc) (ref 1.9–3.7)
Indirect Bilirubin: 0.2 mg/dL (calc) (ref 0.2–1.2)
Total Bilirubin: 0.3 mg/dL (ref 0.2–1.2)
Total Protein: 7 g/dL (ref 6.1–8.1)

## 2022-02-15 ENCOUNTER — Emergency Department (HOSPITAL_COMMUNITY): Payer: 59

## 2022-02-15 ENCOUNTER — Inpatient Hospital Stay (HOSPITAL_COMMUNITY)
Admission: EM | Admit: 2022-02-15 | Discharge: 2022-02-20 | DRG: 419 | Disposition: A | Discharge status: Home / Self Care | Payer: 59 | Attending: General Surgery | Admitting: General Surgery

## 2022-02-15 ENCOUNTER — Encounter (HOSPITAL_COMMUNITY): Payer: Self-pay

## 2022-02-15 ENCOUNTER — Other Ambulatory Visit: Payer: Self-pay

## 2022-02-15 DIAGNOSIS — D72829 Elevated white blood cell count, unspecified: Secondary | ICD-10-CM | POA: Diagnosis present

## 2022-02-15 DIAGNOSIS — Z96652 Presence of left artificial knee joint: Secondary | ICD-10-CM | POA: Diagnosis present

## 2022-02-15 DIAGNOSIS — R1110 Vomiting, unspecified: Secondary | ICD-10-CM | POA: Diagnosis present

## 2022-02-15 DIAGNOSIS — Z833 Family history of diabetes mellitus: Secondary | ICD-10-CM

## 2022-02-15 DIAGNOSIS — D75839 Thrombocytosis, unspecified: Secondary | ICD-10-CM | POA: Diagnosis present

## 2022-02-15 DIAGNOSIS — Z880 Allergy status to penicillin: Secondary | ICD-10-CM

## 2022-02-15 DIAGNOSIS — Z87891 Personal history of nicotine dependence: Secondary | ICD-10-CM

## 2022-02-15 DIAGNOSIS — I10 Essential (primary) hypertension: Secondary | ICD-10-CM | POA: Diagnosis present

## 2022-02-15 DIAGNOSIS — Z1152 Encounter for screening for COVID-19: Secondary | ICD-10-CM

## 2022-02-15 DIAGNOSIS — K828 Other specified diseases of gallbladder: Principal | ICD-10-CM | POA: Diagnosis present

## 2022-02-15 DIAGNOSIS — I700 Atherosclerosis of aorta: Secondary | ICD-10-CM | POA: Diagnosis present

## 2022-02-15 DIAGNOSIS — Z825 Family history of asthma and other chronic lower respiratory diseases: Secondary | ICD-10-CM

## 2022-02-15 DIAGNOSIS — Z79899 Other long term (current) drug therapy: Secondary | ICD-10-CM

## 2022-02-15 DIAGNOSIS — K5792 Diverticulitis of intestine, part unspecified, without perforation or abscess without bleeding: Secondary | ICD-10-CM

## 2022-02-15 DIAGNOSIS — Z800 Family history of malignant neoplasm of digestive organs: Secondary | ICD-10-CM

## 2022-02-15 DIAGNOSIS — Z803 Family history of malignant neoplasm of breast: Secondary | ICD-10-CM

## 2022-02-15 DIAGNOSIS — K8050 Calculus of bile duct without cholangitis or cholecystitis without obstruction: Principal | ICD-10-CM

## 2022-02-15 DIAGNOSIS — Z9012 Acquired absence of left breast and nipple: Secondary | ICD-10-CM

## 2022-02-15 DIAGNOSIS — Z853 Personal history of malignant neoplasm of breast: Secondary | ICD-10-CM

## 2022-02-15 DIAGNOSIS — Z7982 Long term (current) use of aspirin: Secondary | ICD-10-CM

## 2022-02-15 DIAGNOSIS — Z8349 Family history of other endocrine, nutritional and metabolic diseases: Secondary | ICD-10-CM

## 2022-02-15 DIAGNOSIS — Z8249 Family history of ischemic heart disease and other diseases of the circulatory system: Secondary | ICD-10-CM

## 2022-02-15 DIAGNOSIS — R112 Nausea with vomiting, unspecified: Secondary | ICD-10-CM | POA: Diagnosis not present

## 2022-02-15 DIAGNOSIS — M810 Age-related osteoporosis without current pathological fracture: Secondary | ICD-10-CM | POA: Diagnosis present

## 2022-02-15 DIAGNOSIS — K219 Gastro-esophageal reflux disease without esophagitis: Secondary | ICD-10-CM | POA: Insufficient documentation

## 2022-02-15 LAB — URINALYSIS, ROUTINE W REFLEX MICROSCOPIC
Bacteria, UA: NONE SEEN
Bilirubin Urine: NEGATIVE
Glucose, UA: NEGATIVE mg/dL
Hgb urine dipstick: NEGATIVE
Ketones, ur: NEGATIVE mg/dL
Nitrite: NEGATIVE
Protein, ur: NEGATIVE mg/dL
Specific Gravity, Urine: 1.017 (ref 1.005–1.030)
pH: 6 (ref 5.0–8.0)

## 2022-02-15 LAB — CBC WITH DIFFERENTIAL/PLATELET
Abs Immature Granulocytes: 0.04 10*3/uL (ref 0.00–0.07)
Basophils Absolute: 0.1 10*3/uL (ref 0.0–0.1)
Basophils Relative: 1 %
Eosinophils Absolute: 0.2 10*3/uL (ref 0.0–0.5)
Eosinophils Relative: 2 %
HCT: 41.9 % (ref 36.0–46.0)
Hemoglobin: 13.2 g/dL (ref 12.0–15.0)
Immature Granulocytes: 0 %
Lymphocytes Relative: 25 %
Lymphs Abs: 3.2 10*3/uL (ref 0.7–4.0)
MCH: 28.2 pg (ref 26.0–34.0)
MCHC: 31.5 g/dL (ref 30.0–36.0)
MCV: 89.5 fL (ref 80.0–100.0)
Monocytes Absolute: 0.8 10*3/uL (ref 0.1–1.0)
Monocytes Relative: 6 %
Neutro Abs: 8.8 10*3/uL — ABNORMAL HIGH (ref 1.7–7.7)
Neutrophils Relative %: 66 %
Platelets: 407 10*3/uL — ABNORMAL HIGH (ref 150–400)
RBC: 4.68 MIL/uL (ref 3.87–5.11)
RDW: 13.2 % (ref 11.5–15.5)
WBC: 13.2 10*3/uL — ABNORMAL HIGH (ref 4.0–10.5)
nRBC: 0 % (ref 0.0–0.2)

## 2022-02-15 LAB — LIPASE, BLOOD: Lipase: 32 U/L (ref 11–51)

## 2022-02-15 LAB — COMPREHENSIVE METABOLIC PANEL
ALT: 16 U/L (ref 0–44)
AST: 25 U/L (ref 15–41)
Albumin: 4 g/dL (ref 3.5–5.0)
Alkaline Phosphatase: 96 U/L (ref 38–126)
Anion gap: 9 (ref 5–15)
BUN: 23 mg/dL (ref 8–23)
CO2: 29 mmol/L (ref 22–32)
Calcium: 9.6 mg/dL (ref 8.9–10.3)
Chloride: 102 mmol/L (ref 98–111)
Creatinine, Ser: 0.49 mg/dL (ref 0.44–1.00)
GFR, Estimated: >60 mL/min (ref >60)
Glucose, Bld: 115 mg/dL — ABNORMAL HIGH (ref 70–99)
Potassium: 4 mmol/L (ref 3.5–5.1)
Sodium: 140 mmol/L (ref 135–145)
Total Bilirubin: 0.4 mg/dL (ref 0.3–1.2)
Total Protein: 8 g/dL (ref 6.5–8.1)

## 2022-02-15 MED ORDER — ONDANSETRON HCL 4 MG/2ML IJ SOLN
4.0000 mg | Freq: Once | INTRAMUSCULAR | Status: AC
Start: 2022-02-15 — End: 2022-02-15
  Administered 2022-02-15: 4 mg via INTRAVENOUS
  Filled 2022-02-15: qty 2

## 2022-02-15 MED ORDER — HYDROMORPHONE HCL 1 MG/ML IJ SOLN
0.5000 mg | Freq: Once | INTRAMUSCULAR | Status: AC
  Administered 2022-02-15: 0.5 mg via INTRAVENOUS
  Filled 2022-02-15: qty 0.5

## 2022-02-15 MED ORDER — IOHEXOL 300 MG/ML  SOLN
100.0000 mL | Freq: Once | INTRAMUSCULAR | Status: AC | PRN
  Administered 2022-02-15: 100 mL via INTRAVENOUS

## 2022-02-15 NOTE — ED Provider Notes (Signed)
[facility] EMERGENCY DEPARTMENT Provider Note   CSN: [id number] Arrival date & time: 02/15/22  2012     History {Add pertinent medical, surgical, social history, OB history to HPI:1} Chief Complaint  Patient presents with   Abdominal Pain    Claudia Murphy is a 66 y.o. female with *** presents with ***.    Abdominal Pain      Home Medications Prior to Admission medications   Medication Sig Start Date End Date Taking? Authorizing Provider  aspirin EC 81 MG tablet Take 81 mg by mouth daily.    [provider]  calcium carbonate (OS-CAL - DOSED IN MG OF ELEMENTAL CALCIUM) 1250 (500 Ca) MG tablet Take 2 tablets by mouth 2 (two) times daily.     [provider]  diclofenac (VOLTAREN) 75 MG EC tablet Take 1 tablet (75 mg total) by mouth 2 (two) times daily. 03/19/21   Wallene Huh, DPM  losartan (COZAAR) 25 MG tablet TAKE 1 TABLET BY MOUTH EVERY DAY 03/19/21   Derrek Monaco A, NP  pantoprazole (PROTONIX) 40 MG tablet Take 1 tablet (40 mg total) by mouth daily. 30 minutes before breakfast 11/04/21   Annitta Needs, NP  Probiotic Product (PROBIOTIC PO) Take by mouth.    [provider]      Allergies    Epirubicin, Latex, Methocarbamol, Penicillins, and Golytely [peg [id number]]    Review of Systems   Review of Systems  Gastrointestinal:  Positive for abdominal pain.   Review of systems {pos/neg:18640::"Negative","Positive"} for ***.  A 10 point review of systems was performed and is negative unless otherwise reported in HPI.  Physical Exam Updated Vital Signs BP (!) 147/90 (BP Location: Right Arm)   Pulse 100   Temp 97.7 F (36.5 C) (Oral)   Resp 18   Ht '5\' 1"'$  (1.549 m)   Wt 70.3 kg   SpO2 97%   BMI 29.29 kg/m  Physical Exam General: Normal appearing {Desc; female/female:11659}, lying in bed.  HEENT: PERRLA, Sclera anicteric, MMM, trachea midline.  Cardiology: RRR, no murmurs/rubs/gallops. BL radial and DP pulses equal  bilaterally.  Resp: Normal respiratory rate and effort. CTAB, no wheezes, rhonchi, crackles.  Abd: Soft, non-tender, non-distended. No rebound tenderness or guarding.  GU: Deferred. MSK: No peripheral edema or signs of trauma. Extremities without deformity or TTP. No cyanosis or clubbing. Skin: warm, dry. No rashes or lesions. Back: No CVA tenderness Neuro: A&Ox4, CNs II-XII grossly intact. MAEs. Sensation grossly intact.  Psych: Normal mood and affect.   ED Results / Procedures / Treatments   Labs (all labs ordered are listed, but only abnormal results are displayed) Labs Reviewed - No data to display  EKG None  Radiology No results found.  Procedures Procedures  {Document cardiac monitor, telemetry assessment procedure when appropriate:1}  Medications Ordered in ED Medications - No data to display  ED Course/ Medical Decision Making/ A&P                          Medical Decision Making   This patient presents to the ED for concern of ***, this involves an extensive number of treatment options, and is a complaint that carries with it a high risk of complications and morbidity.  I considered the following differential and admission for this acute, potentially life threatening condition.   MDM:    ***     Labs: I Ordered, and personally interpreted labs.  The pertinent results include:  ***  Imaging Studies ordered: I ordered imaging studies including *** I independently visualized and interpreted imaging. I agree with the radiologist interpretation  Additional history obtained from ***.  External records from outside source obtained and reviewed including ***  Cardiac Monitoring: The patient was maintained on a cardiac monitor.  I personally viewed and interpreted the cardiac monitored which showed an underlying rhythm of: ***  Reevaluation: After the interventions noted above, I reevaluated the patient and found that they have  :{resolved/improved/worsened:23923::"improved"}  Social Determinants of Health: ***  Disposition:  ***  Co morbidities that complicate the patient evaluation  Past Medical History:  Diagnosis Date   Anxiety    Arthritis    "all over my body" (01/25/2012)   Breast cancer ([place])    "left" (01/25/2012)   Breast disorder    breast left   Carpal tunnel syndrome on both sides    Cataract mature, total senile    "both" (01/25/2012)   Cataracts, bilateral    Chronic lower back pain    DJD (degenerative joint disease)    "neck, shoulders, back" (01/25/2012)   Family history of breast cancer    Family history of stomach cancer    ?niece? 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   I have reviewed the patients home medicines and have made adjustments as needed  Problem List / ED Course: Problem List Items Addressed This Visit   None        {Document critical care time when appropriate:1} {Document review of labs and clinical decision tools ie heart score, Chads2Vasc2 etc:1}  {Document your independent review of radiology images, and any outside records:1} {Document your discussion with family members, caretakers, and with consultants:1} {Document social determinants of health affecting pt's care:1} {Document your decision making why or why not admission, treatments were needed:1}  This note was created using dictation software, which may contain  spelling or grammatical errors.

## 2022-02-15 NOTE — ED Triage Notes (Signed)
Pt reports RUQ pain shortly after eating some fried food this afternoon.  Pt reports she has had similar pain in the past but never lasted this long.  She has taken gas-x, patoprazole and pepto bismol with no relief.

## 2022-02-16 ENCOUNTER — Observation Stay (HOSPITAL_COMMUNITY): Payer: 59

## 2022-02-16 DIAGNOSIS — K8050 Calculus of bile duct without cholangitis or cholecystitis without obstruction: Secondary | ICD-10-CM | POA: Diagnosis not present

## 2022-02-16 DIAGNOSIS — Z1152 Encounter for screening for COVID-19: Secondary | ICD-10-CM | POA: Diagnosis not present

## 2022-02-16 DIAGNOSIS — Z880 Allergy status to penicillin: Secondary | ICD-10-CM | POA: Diagnosis not present

## 2022-02-16 DIAGNOSIS — K828 Other specified diseases of gallbladder: Secondary | ICD-10-CM | POA: Diagnosis present

## 2022-02-16 DIAGNOSIS — Z87891 Personal history of nicotine dependence: Secondary | ICD-10-CM | POA: Diagnosis not present

## 2022-02-16 DIAGNOSIS — M810 Age-related osteoporosis without current pathological fracture: Secondary | ICD-10-CM | POA: Diagnosis present

## 2022-02-16 DIAGNOSIS — K219 Gastro-esophageal reflux disease without esophagitis: Secondary | ICD-10-CM | POA: Diagnosis present

## 2022-02-16 DIAGNOSIS — Z9012 Acquired absence of left breast and nipple: Secondary | ICD-10-CM | POA: Diagnosis not present

## 2022-02-16 DIAGNOSIS — R112 Nausea with vomiting, unspecified: Secondary | ICD-10-CM | POA: Diagnosis present

## 2022-02-16 DIAGNOSIS — Z833 Family history of diabetes mellitus: Secondary | ICD-10-CM | POA: Diagnosis not present

## 2022-02-16 DIAGNOSIS — D72829 Elevated white blood cell count, unspecified: Secondary | ICD-10-CM | POA: Diagnosis not present

## 2022-02-16 DIAGNOSIS — K5792 Diverticulitis of intestine, part unspecified, without perforation or abscess without bleeding: Secondary | ICD-10-CM | POA: Diagnosis not present

## 2022-02-16 DIAGNOSIS — Z79899 Other long term (current) drug therapy: Secondary | ICD-10-CM | POA: Diagnosis not present

## 2022-02-16 DIAGNOSIS — I10 Essential (primary) hypertension: Secondary | ICD-10-CM | POA: Diagnosis not present

## 2022-02-16 DIAGNOSIS — D75839 Thrombocytosis, unspecified: Secondary | ICD-10-CM | POA: Diagnosis not present

## 2022-02-16 DIAGNOSIS — Z803 Family history of malignant neoplasm of breast: Secondary | ICD-10-CM | POA: Diagnosis not present

## 2022-02-16 DIAGNOSIS — Z96652 Presence of left artificial knee joint: Secondary | ICD-10-CM | POA: Diagnosis present

## 2022-02-16 DIAGNOSIS — Z8349 Family history of other endocrine, nutritional and metabolic diseases: Secondary | ICD-10-CM | POA: Diagnosis not present

## 2022-02-16 DIAGNOSIS — Z800 Family history of malignant neoplasm of digestive organs: Secondary | ICD-10-CM | POA: Diagnosis not present

## 2022-02-16 DIAGNOSIS — I700 Atherosclerosis of aorta: Secondary | ICD-10-CM | POA: Diagnosis present

## 2022-02-16 DIAGNOSIS — R1110 Vomiting, unspecified: Secondary | ICD-10-CM | POA: Diagnosis not present

## 2022-02-16 DIAGNOSIS — Z853 Personal history of malignant neoplasm of breast: Secondary | ICD-10-CM | POA: Diagnosis not present

## 2022-02-16 DIAGNOSIS — Z7982 Long term (current) use of aspirin: Secondary | ICD-10-CM | POA: Diagnosis not present

## 2022-02-16 DIAGNOSIS — Z825 Family history of asthma and other chronic lower respiratory diseases: Secondary | ICD-10-CM | POA: Diagnosis not present

## 2022-02-16 DIAGNOSIS — Z8249 Family history of ischemic heart disease and other diseases of the circulatory system: Secondary | ICD-10-CM | POA: Diagnosis not present

## 2022-02-16 LAB — COMPREHENSIVE METABOLIC PANEL
ALT: 15 U/L (ref 0–44)
AST: 25 U/L (ref 15–41)
Albumin: 3.7 g/dL (ref 3.5–5.0)
Alkaline Phosphatase: 88 U/L (ref 38–126)
Anion gap: 8 (ref 5–15)
BUN: 21 mg/dL (ref 8–23)
CO2: 26 mmol/L (ref 22–32)
Calcium: 9.3 mg/dL (ref 8.9–10.3)
Chloride: 106 mmol/L (ref 98–111)
Creatinine, Ser: 0.48 mg/dL (ref 0.44–1.00)
GFR, Estimated: >60 mL/min (ref >60)
Glucose, Bld: 114 mg/dL — ABNORMAL HIGH (ref 70–99)
Potassium: 3.8 mmol/L (ref 3.5–5.1)
Sodium: 140 mmol/L (ref 135–145)
Total Bilirubin: 0.6 mg/dL (ref 0.3–1.2)
Total Protein: 7.4 g/dL (ref 6.5–8.1)

## 2022-02-16 LAB — RESPIRATORY PANEL BY PCR

## 2022-02-16 LAB — CBC
HCT: 38.9 % (ref 36.0–46.0)
Hemoglobin: 12.5 g/dL (ref 12.0–15.0)
MCH: 28.3 pg (ref 26.0–34.0)
MCHC: 32.1 g/dL (ref 30.0–36.0)
MCV: 88.2 fL (ref 80.0–100.0)
Platelets: 370 10*3/uL (ref 150–400)
RBC: 4.41 MIL/uL (ref 3.87–5.11)
RDW: 13.2 % (ref 11.5–15.5)
WBC: 14.9 10*3/uL — ABNORMAL HIGH (ref 4.0–10.5)
nRBC: 0 % (ref 0.0–0.2)

## 2022-02-16 LAB — RESP PANEL BY RT-PCR (RSV, FLU A&B, COVID)  RVPGX2
Influenza A by PCR: NEGATIVE
Influenza B by PCR: NEGATIVE
Resp Syncytial Virus by PCR: NEGATIVE
SARS Coronavirus 2 by RT PCR: NEGATIVE

## 2022-02-16 LAB — PHOSPHORUS: Phosphorus: 3.4 mg/dL (ref 2.5–4.6)

## 2022-02-16 LAB — HIV ANTIBODY (ROUTINE TESTING W REFLEX): HIV Screen 4th Generation wRfx: NONREACTIVE

## 2022-02-16 LAB — MAGNESIUM: Magnesium: 2 mg/dL (ref 1.7–2.4)

## 2022-02-16 MED ORDER — ONDANSETRON HCL 4 MG PO TABS: 4.0000 mg | ORAL_TABLET | Freq: Four times a day (QID) | ORAL | Status: DC | PRN

## 2022-02-16 MED ORDER — PANTOPRAZOLE SODIUM 40 MG PO TBEC
40.0000 mg | DELAYED_RELEASE_TABLET | Freq: Every day | ORAL | Status: DC
  Administered 2022-02-16: 40 mg via ORAL
  Filled 2022-02-16: qty 1

## 2022-02-16 MED ORDER — SODIUM CHLORIDE 0.9 % IV SOLN
2.0000 g | INTRAVENOUS | Status: DC
  Administered 2022-02-16: 2 g via INTRAVENOUS
  Filled 2022-02-16: qty 20

## 2022-02-16 MED ORDER — HYDROMORPHONE HCL 1 MG/ML IJ SOLN
0.5000 mg | INTRAMUSCULAR | Status: DC | PRN
  Administered 2022-02-16 (×4): 0.5 mg via INTRAVENOUS
  Filled 2022-02-16 (×4): qty 0.5

## 2022-02-16 MED ORDER — ONDANSETRON HCL 4 MG/2ML IJ SOLN
4.0000 mg | Freq: Four times a day (QID) | INTRAMUSCULAR | Status: DC | PRN
  Administered 2022-02-16: 4 mg via INTRAVENOUS
  Filled 2022-02-16: qty 2

## 2022-02-16 MED ORDER — SODIUM CHLORIDE 0.9 % IV SOLN
1.0000 g | Freq: Three times a day (TID) | INTRAVENOUS | Status: DC
  Administered 2022-02-16 – 2022-02-18 (×6): 1 g via INTRAVENOUS
  Filled 2022-02-16 (×6): qty 20

## 2022-02-16 MED ORDER — METRONIDAZOLE 500 MG/100ML IV SOLN
500.0000 mg | Freq: Two times a day (BID) | INTRAVENOUS | Status: DC
  Administered 2022-02-16: 500 mg via INTRAVENOUS
  Filled 2022-02-16: qty 100

## 2022-02-16 MED ORDER — PANTOPRAZOLE SODIUM 40 MG IV SOLR
40.0000 mg | Freq: Two times a day (BID) | INTRAVENOUS | Status: DC
  Administered 2022-02-16 – 2022-02-20 (×8): 40 mg via INTRAVENOUS
  Filled 2022-02-16 (×8): qty 10

## 2022-02-16 MED ORDER — METOCLOPRAMIDE HCL 5 MG/ML IJ SOLN
5.0000 mg | Freq: Three times a day (TID) | INTRAMUSCULAR | Status: AC
  Administered 2022-02-16 – 2022-02-17 (×3): 5 mg via INTRAVENOUS
  Filled 2022-02-16 (×3): qty 2

## 2022-02-16 MED ORDER — ONDANSETRON HCL 4 MG/2ML IJ SOLN
4.0000 mg | Freq: Four times a day (QID) | INTRAMUSCULAR | Status: DC
  Administered 2022-02-16 – 2022-02-20 (×14): 4 mg via INTRAVENOUS
  Filled 2022-02-16 (×15): qty 2

## 2022-02-16 MED ORDER — LOSARTAN POTASSIUM 50 MG PO TABS
25.0000 mg | ORAL_TABLET | Freq: Every day | ORAL | Status: DC
  Administered 2022-02-16: 25 mg via ORAL
  Filled 2022-02-16: qty 1

## 2022-02-16 MED ORDER — ACETAMINOPHEN 325 MG PO TABS
650.0000 mg | ORAL_TABLET | Freq: Four times a day (QID) | ORAL | Status: DC | PRN
  Administered 2022-02-18 (×2): 650 mg via ORAL
  Filled 2022-02-16 (×2): qty 2

## 2022-02-16 MED ORDER — MELATONIN 3 MG PO TABS: 6.0000 mg | ORAL_TABLET | Freq: Every evening | ORAL | Status: DC | PRN

## 2022-02-16 MED ORDER — KETOROLAC TROMETHAMINE 15 MG/ML IJ SOLN
15.0000 mg | Freq: Four times a day (QID) | INTRAMUSCULAR | Status: DC | PRN
  Administered 2022-02-16: 15 mg via INTRAVENOUS
  Filled 2022-02-16: qty 1

## 2022-02-16 MED ORDER — SODIUM CHLORIDE 0.9 % IV SOLN: INTRAVENOUS | Status: DC

## 2022-02-16 MED ORDER — LACTATED RINGERS IV SOLN: INTRAVENOUS | Status: DC

## 2022-02-16 MED ORDER — ACETAMINOPHEN 650 MG RE SUPP: 650.0000 mg | Freq: Four times a day (QID) | RECTAL | Status: DC | PRN

## 2022-02-16 MED ORDER — ENOXAPARIN SODIUM 40 MG/0.4ML IJ SOSY
40.0000 mg | PREFILLED_SYRINGE | INTRAMUSCULAR | Status: DC
  Administered 2022-02-16 – 2022-02-20 (×5): 40 mg via SUBCUTANEOUS
  Filled 2022-02-16 (×5): qty 0.4

## 2022-02-16 NOTE — Progress Notes (Signed)
Patient reported having dry heaves and two emesis episodes, only liquids. Patient given scheduled Reglan, see MAR. Family at bedside requesting to speak with MD. MD Tat made aware.

## 2022-02-16 NOTE — ED Notes (Signed)
Tat. MD at bedside

## 2022-02-16 NOTE — Hospital Course (Signed)
67 year old female with a history of GERD and hypertension presenting with right upper quadrant abdominal pain that began in the afternoon of 02/15/2022 after eating at a restaurant.  She had an episode of nausea and vomiting.  She states that she had been in her usual state of health without any issues prior to this episode.  She had denied any fevers, chills, chest pain, shortness of breath, coughing, hemoptysis, dysuria, hematuria.  She has not had any diarrhea, hematochezia, melena.  She states that she was started on Celebrex about 1 month prior to this admission with no other new medications.  She tried taking some over-the-counter remedies without relief of abdominal pain.  As result, she presented for further evaluation and treatment. In the ED, the patient was afebrile and hemodynamically stable with oxygen saturation 99% on room air.  WBC 14.9, hemoglobin 12.5, platelets 370,000.  Sodium 140, potassium 3.8, bicarbonate 26, serum creatinine 0.48, AST 25, ALT 15, alk phosphatase 88, total bilirubin 0.6. CT of the abdomen and pelvis showed scattered colon diverticula with focal wall thickening in the sigmoid colon and adjacent fat stranding.  This was concerning for diverticulitis.  There is no perforation or abscess.  There is mildly prominent extrahepatic biliary common bile duct.  There were no gallstones or gallbladder wall thickening noted.  UA was negative for any pyuria.  The patient was started on intravenous hydromorphone for pain and admitted for further evaluation and treatment.

## 2022-02-16 NOTE — Progress Notes (Addendum)
         PROGRESS NOTE  Claudia Murphy MRN:[id number] DOB: 04/05/1955 DOA: 02/15/2022 PCP: Fusco, Lawrence, MD  Brief History:  66-year-old female with a history of GERD and hypertension presenting with right upper quadrant abdominal pain that began in the afternoon of 02/15/2022 after eating at a restaurant.  She had an episode of nausea and vomiting.  She states that she had been in her usual state of health without any issues prior to this episode.  She had denied any fevers, chills, chest pain, shortness of breath, coughing, hemoptysis, dysuria, hematuria.  She has not had any diarrhea, hematochezia, melena.  She states that she was started on Celebrex about 1 month prior to this admission with no other new medications.  She tried taking some over-the-counter remedies without relief of abdominal pain.  As result, she presented for further evaluation and treatment. In the ED, the patient was afebrile and hemodynamically stable with oxygen saturation 99% on room air.  WBC 14.9, hemoglobin 12.5, platelets 370,000.  Sodium 140, potassium 3.8, bicarbonate 26, serum creatinine 0.48, AST 25, ALT 15, alk phosphatase 88, total bilirubin 0.6. CT of the abdomen and pelvis showed scattered colon diverticula with focal wall thickening in the sigmoid colon and adjacent fat stranding.  This was concerning for diverticulitis.  There is no perforation or abscess.  There is mildly prominent extrahepatic biliary common bile duct.  There were no gallstones or gallbladder wall thickening noted.  UA was negative for any pyuria.  The patient was started on intravenous hydromorphone for pain and admitted for further evaluation and treatment.   Assessment/Plan: Abdominal pain -Likely multifactorial including diverticulitis and possible biliary colic -LFTs were unremarkable with normal total bilirubin -Lipase 32 -Obtain abdominal ultrasound -Continue IV hydromorphone as needed pain  Diverticulitis -Start ceftriaxone  and metronidazole -Clear liquid diet for now  Personal history of penicillin allergy -Discussed with the patient -She states that she was able to take amoxicillin in the recent past without difficulty -Continue ceftriaxone as planned and monitor closely  Essential hypertension -Continue losartan  GERD -Continue pantoprazole     Family Communication: no  Family at bedside  Consultants:  none  Code Status:  FULL   DVT Prophylaxis:  [place] Lovenox   Procedures: As Listed in Progress Note Above  Antibiotics: Ceftriaxone 1/1>> Metronidazole 1/1>>     Total time spent 50 minutes.  Greater than 50% spent face to face counseling and coordinating care.    Subjective: She continues to have abd pain but states it is controlled with hydromorphone.  Had dry heave in ED.  No f/c, cp, sob, hematochezia, melena, hematemesis.  Objective: Vitals:   02/15/22 2315 02/16/22 0237 02/16/22 0653 02/16/22 0700  BP:  (!) 145/74 (!) 151/85 134/72  Pulse: (!) 104 99 90 96  Resp: 20 18 18   Temp:  98 F (36.7 C) 98 F (36.7 C)   TempSrc:  Oral Oral   SpO2: 98% 97% 99% 97%  Weight:      Height:       No intake or output data in the 24 hours ending 02/16/22 0836 Weight change:  Exam:  General:  Pt is alert, follows commands appropriately, not in acute distress HEENT: No icterus, No thrush, No neck mass, [place]/AT Cardiovascular: RRR, S1/S2, no rubs, no gallops Respiratory: CTA bilaterally, no wheezing, no crackles, no rhonchi Abdomen: Soft/+BS, RUQ tender, non distended, no guarding Extremities: No edema, No lymphangitis, No petechiae, No rashes, no synovitis     Data Reviewed: I have personally reviewed following labs and imaging studies Basic Metabolic Panel: Recent Labs  Lab 02/15/22 2122 02/16/22 0241  NA 140 140  K 4.0 3.8  CL 102 106  CO2 29 26  GLUCOSE 115* 114*  BUN 23 21  CREATININE 0.49 0.48  CALCIUM 9.6 9.3  MG  --  2.0  PHOS  --  3.4   Liver Function  Tests: Recent Labs  Lab 02/15/22 2122 02/16/22 0241  AST 25 25  ALT 16 15  ALKPHOS 96 88  BILITOT 0.4 0.6  PROT 8.0 7.4  ALBUMIN 4.0 3.7   Recent Labs  Lab 02/15/22 2122  LIPASE 32   No results for input(s): "AMMONIA" in the last 168 hours. Coagulation Profile: No results for input(s): "INR", "PROTIME" in the last 168 hours. CBC: Recent Labs  Lab 02/15/22 2122 02/16/22 0241  WBC 13.2* 14.9*  NEUTROABS 8.8*  --   HGB 13.2 12.5  HCT 41.9 38.9  MCV 89.5 88.2  PLT 407* 370   Cardiac Enzymes: No results for input(s): "CKTOTAL", "CKMB", "CKMBINDEX", "TROPONINI" in the last 168 hours. BNP: Invalid input(s): "POCBNP" CBG: No results for input(s): "GLUCAP" in the last 168 hours. HbA1C: No results for input(s): "HGBA1C" in the last 72 hours. Urine analysis:    Component Value Date/Time   COLORURINE YELLOW 02/15/2022 2122   APPEARANCEUR CLEAR 02/15/2022 2122   LABSPEC 1.017 02/15/2022 2122   PHURINE 6.0 02/15/2022 2122   GLUCOSEU NEGATIVE 02/15/2022 2122   HGBUR NEGATIVE 02/15/2022 2122   BILIRUBINUR NEGATIVE 02/15/2022 2122   KETONESUR NEGATIVE 02/15/2022 2122   PROTEINUR NEGATIVE 02/15/2022 2122   UROBILINOGEN 0.2 01/20/2012 1020   NITRITE NEGATIVE 02/15/2022 2122   LEUKOCYTESUR TRACE (A) 02/15/2022 2122   Sepsis Labs: @LABRCNTIP(procalcitonin:4,lacticidven:4) )No results found for this or any previous visit (from the past 240 hour(s)).   Scheduled Meds:  enoxaparin (LOVENOX) injection  40 mg Subcutaneous Q24H   losartan  25 mg Oral Daily   pantoprazole  40 mg Oral Daily   Continuous Infusions:  sodium chloride 75 mL/hr at 02/16/22 0238    Procedures/Studies: CT ABDOMEN PELVIS W CONTRAST  Result Date: 02/15/2022 CLINICAL DATA:  Abdominal pain, acute, nonlocalized. EXAM: CT ABDOMEN AND PELVIS WITH CONTRAST TECHNIQUE: Multidetector CT imaging of the abdomen and pelvis was performed using the standard protocol following bolus administration of intravenous  contrast. RADIATION DOSE REDUCTION: This exam was performed according to the departmental dose-optimization program which includes automated exposure control, adjustment of the mA and/or kV according to patient size and/or use of iterative reconstruction technique. CONTRAST:  100mL OMNIPAQUE IOHEXOL 300 MG/ML  SOLN COMPARISON:  None Available. FINDINGS: Lower chest: No acute abnormality. Hepatobiliary: No focal liver abnormality is seen. No gallstones, gallbladder wall thickening, or biliary dilatation. Prominent extrahepatic common duct measuring up to 6 mm without evidence of calculus. Pancreas: Moderate generalized pancreatic atrophy. No pancreatic ductal dilatation or surrounding inflammatory changes. Spleen: Normal in size without focal abnormality. Adrenals/Urinary Tract: Adrenal glands are unremarkable. Exophytic simple cysts in the upper pole of the left kidney. Small 3-4 mm cyst in the upper pole of the right kidney. No evidence of hydronephrosis or ureteral calculus. Bladder is unremarkable. Stomach/Bowel: Stomach is within normal limits. Appendix appears normal. Scattered colonic diverticuli. Focal wall thickening in the sigmoid colon with mild adjacent fat stranding concerning for acute diverticulitis. Vascular/Lymphatic: Mild aortic atherosclerosis. No enlarged abdominal or pelvic lymph nodes. Reproductive: Uterus and bilateral adnexa are unremarkable. Other: Small fat containing umbilical hernia.   No abdominopelvic ascites. 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 Dextroscoliosis centered at L2-L3 vertebral bodies. IMPRESSION: 1. Scattered colonic diverticula with focal wall thickening in the sigmoid colon and mild adjacent fat stranding concerning for acute diverticulitis. No evidence of perforation or abscess. 2. Mildly prominent extrahepatic common bile duct without evidence of biliary calculus. Clinical correlation is suggested. If there is high clinical  concern for biliary obstruction, MRCP could be considered for further evaluation. 3. Moderate generalized pancreatic atrophy. 4. 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 5. Small fat containing umbilical hernia. Aortic Atherosclerosis (ICD10-I70.0). Electronically Signed   By: Imran  Ahmed D.O.   On: 02/15/2022 23:12    David Tat, DO  [facility]  If 7PM-7AM, please contact night-coverage [email or web address] Password [email or web address] 02/16/2022, 8:36 AM   LOS: 0 days   

## 2022-02-16 NOTE — H&P (Addendum)
History and Physical    Patient: Claudia Murphy DOB: 02/18/1955 DOA: 02/15/2022 DOS: the patient was seen and examined on 02/16/2022 PCP: Redmond School, MD  Patient coming from: Home  Chief Complaint:  Chief Complaint  Patient presents with   Abdominal Pain   HPI: Claudia Murphy is a 67 y.o. female with medical history significant of hypertension, GERD who presents emergency department due to right upper quadrant pain shortly after eating some fried food in the afternoon yesterday.  She endorsed of having similar pain in the past that did not last this long.  Abdominal pain was rated as 9/10 on pain scale and it was constant.  She took Protonix, Pepto-Bismol and Gas-X without any relief, associated to go to the ED for further evaluation and management.  She denies tobacco, alcohol or any other recreational drug use.  ED Course:  In the emergency department, BP was 140/90, but other vital signs were within normal range.  Workup in the ED showed leukocytosis and thrombocytosis.  BMP was normal except for blood glucose of 115, lipase 32, urinalysis was normal. CT abdomen and pelvis with contrast showed scattered  colonic diverticula with focal wall thickening in the sigmoid colon and mild adjacent fat stranding concerning for acute diverticulitis. No evidence of perforation or abscess. Mildly prominent extrahepatic common bile duct without evidence of biliary calculus. Clinical correlation is suggested. If there is high clinical concern for biliary obstruction, MRCP could be considered for further evaluation.   Review of Systems: Review of systems as noted in the HPI. All other systems reviewed and are negative.   Past Medical History:  Diagnosis Date   Anxiety    Arthritis    "all over my body" (01/25/2012)   Breast cancer ([place])    "left" (01/25/2012)   Breast disorder    breast left   Carpal tunnel syndrome on both sides    Cataract mature, total senile    "both"  (01/25/2012)   Cataracts, bilateral    Chronic lower back pain    DJD (degenerative joint disease)    "neck, shoulders, back" (01/25/2012)   Family history of breast cancer    Family history of stomach cancer    ?niece? 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 Repeat screening colonoscopy in 5-10 years.   COLONOSCOPY WITH PROPOFOL N/A 10/18/2018   diverticulosis, torturous colon, hemorrhoids.   FOOT SURGERY     INJECTION KNEE  2013   lt   KNEE ARTHROSCOPY  1980's   "left" (01/25/2012)   MASTECTOMY  02/2005   left   PARTIAL KNEE ARTHROPLASTY  01/25/2012   Procedure: UNICOMPARTMENTAL KNEE;  Surgeon: Lorn Junes, MD;  Location: [facility];  Service: Orthopedics;  Laterality: Left;  LEFT KNEE UNICOMPARTMENTAL ARTHROPLASTY    PERIPHERALLY INSERTED CENTRAL CATHETER INSERTION  2007   PLANTAR FASCIA SURGERY  11/25/2010   "left" (01/25/2012)   REPLACEMENT UNICONDYLAR JOINT KNEE  01/25/2012   "left" (01/25/2012)   THROMBECTOMY / EMBOLECTOMY SUBCLAVIAN ARTERY  2007   "post PICC line removal" (01/25/2012)   TUBAL LIGATION  1970's    Social History:  reports that she quit smoking about 17 years ago. Her smoking use included cigarettes. She has a 49.50 pack-year smoking history. She has never used smokeless  tobacco. She reports that she does not drink alcohol and does not use drugs.   Allergies  Allergen Reactions   Epirubicin Other (See Comments)    Skin toxicity "hands started peeling and turning real red" (01/25/2012)   Latex Other (See Comments)    Blisters   Methocarbamol Shortness Of Breath   Penicillins Rash and Other (See Comments)    "turned real real red; took 1 pill and ended up in hospital for 2 wks; in the 1980's" (01/25/2012) Did it involve swelling of the face/tongue/throat, SOB, or low BP? No Did it involve sudden or severe rash/hives, skin peeling, or any reaction on the inside of your mouth or nose? Yes Did you need to seek medical attention at a hospital or doctor's office? Yes When did it last happen?1980s       If all above answers are "NO", may proceed with cephalosporin use.    Golytely [Peg 3350-Kcl-Nabcb-Nacl-Nasulf]     NAUSEA/VOMITING, WEAKNESS    Family History  Problem Relation Age of Onset   Diabetes Mother    Hypertension Mother    Heart disease Father    Hypertension Father    Diabetes Son    Diabetes Sister    Thyroid disease Sister    Cancer Sister        lung and brain   Diabetes Sister    Diabetes Sister    Breast cancer Sister 38   Diabetes Sister    COPD Brother    Hypertension Brother    Breast cancer Maternal Aunt 48   Stomach cancer Maternal Uncle    Cancer Other 35       possible uterine cancer   Colon cancer Neg Hx      Prior to Admission medications   Medication Sig Start Date End Date Taking? Authorizing Provider  aspirin EC 81 MG tablet Take 81 mg by mouth daily.    [provider]  calcium carbonate (OS-CAL - DOSED IN MG OF ELEMENTAL CALCIUM) 1250 (500 Ca) MG tablet Take 2 tablets by mouth 2 (two) times daily.     [provider]  diclofenac (VOLTAREN) 75 MG EC tablet Take 1 tablet (75 mg total) by mouth 2 (two) times daily. 03/19/21   Wallene Huh, DPM  losartan (COZAAR) 25 MG tablet TAKE 1 TABLET BY  MOUTH EVERY DAY 03/19/21   Derrek Monaco A, NP  pantoprazole (PROTONIX) 40 MG tablet Take 1 tablet (40 mg total) by mouth daily. 30 minutes before breakfast 11/04/21   Annitta Needs, NP  Probiotic Product (PROBIOTIC PO) Take by mouth.    [provider]    Physical Exam: BP (!) 150/88   Pulse (!) 104   Temp 97.7 F (36.5 C) (Oral)   Resp 20   Ht '5\' 1"'$  (1.549 m)   Wt 70.3 kg   SpO2 98%   BMI 29.29 kg/m   General: 67 y.o. year-old female well developed  well nourished in no acute distress.  Alert and oriented x3. HEENT: NCAT, EOMI Neck: Supple, trachea medial Cardiovascular: Regular rate and rhythm with no rubs or gallops.  No thyromegaly or JVD noted.  No lower extremity edema. 2/4 pulses in all 4 extremities. Respiratory: Clear to auscultation with no wheezes or rales. Good inspiratory effort. Abdomen: Soft, tender to palpation of RUQ.  Nondistended with normal bowel sounds x4 quadrants. Muskuloskeletal: No cyanosis, clubbing or edema noted bilaterally Neuro: CN II-XII intact, strength 5/5 x 4, sensation, reflexes intact Skin: No ulcerative lesions noted or rashes Psychiatry: Judgement and insight appear normal. Mood is appropriate for condition and setting          Labs on Admission:  Basic Metabolic Panel: Recent Labs  Lab 02/15/22 2122  NA 140  K 4.0  CL 102  CO2 29  GLUCOSE 115*  BUN 23  CREATININE 0.49  CALCIUM 9.6   Liver Function Tests: Recent Labs  Lab 02/15/22 2122  AST 25  ALT 16  ALKPHOS 96  BILITOT 0.4  PROT 8.0  ALBUMIN 4.0   Recent Labs  Lab 02/15/22 2122  LIPASE 32   No results for input(s): "AMMONIA" in the last 168 hours. CBC: Recent Labs  Lab 02/15/22 2122  WBC 13.2*  NEUTROABS 8.8*  HGB 13.2  HCT 41.9  MCV 89.5  PLT 407*   Cardiac Enzymes: No results for input(s): "CKTOTAL", "CKMB", "CKMBINDEX", "TROPONINI" in the last 168 hours.  BNP (last 3 results) No results for input(s): "BNP" in the last 8760  hours.  ProBNP (last 3 results) No results for input(s): "PROBNP" in the last 8760 hours.  CBG: No results for input(s): "GLUCAP" in the last 168 hours.  Radiological Exams on Admission: CT ABDOMEN PELVIS W CONTRAST  Result Date: 02/15/2022 CLINICAL DATA:  Abdominal pain, acute, nonlocalized. EXAM: CT ABDOMEN AND PELVIS WITH CONTRAST TECHNIQUE: Multidetector CT imaging of the abdomen and pelvis was performed using the standard protocol following bolus administration of intravenous contrast. RADIATION DOSE REDUCTION: This exam was performed according to the departmental dose-optimization program which includes automated exposure control, adjustment of the mA and/or kV according to patient size and/or use of iterative reconstruction technique. CONTRAST:  159m OMNIPAQUE IOHEXOL 300 MG/ML  SOLN COMPARISON:  None Available. FINDINGS: Lower chest: No acute abnormality. Hepatobiliary: No focal liver abnormality is seen. No gallstones, gallbladder wall thickening, or biliary dilatation. Prominent extrahepatic common duct measuring up to 6 mm without evidence of calculus. Pancreas: Moderate generalized pancreatic atrophy. No pancreatic ductal dilatation or surrounding inflammatory changes. Spleen: Normal in size without focal abnormality. Adrenals/Urinary Tract: Adrenal glands are unremarkable. Exophytic simple cysts in the upper pole of the left kidney. Small 3-4 mm cyst in the upper pole of the right kidney. No evidence of hydronephrosis or ureteral calculus. Bladder is unremarkable. Stomach/Bowel: Stomach is within normal limits. Appendix appears normal. Scattered colonic diverticuli. Focal wall thickening in the sigmoid colon with mild adjacent fat stranding concerning for acute diverticulitis. Vascular/Lymphatic: Mild aortic atherosclerosis. No enlarged abdominal or pelvic lymph nodes. Reproductive: Uterus and bilateral adnexa are unremarkable. Other: Small fat containing umbilical hernia. No abdominopelvic  ascites. 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 Dextroscoliosis centered at L2-L3 vertebral bodies. IMPRESSION: 1. Scattered colonic diverticula with focal wall thickening in the sigmoid colon and mild adjacent fat stranding concerning for acute diverticulitis. No evidence of perforation or abscess. 2. Mildly prominent extrahepatic common bile duct without evidence of biliary calculus. Clinical correlation is suggested. If  there is high clinical concern for biliary obstruction, MRCP could be considered for further evaluation. 3. Moderate generalized pancreatic atrophy. 4. 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 5. Small fat containing umbilical hernia. Aortic Atherosclerosis (ICD10-I70.0). Electronically Signed   By: Keane Police D.O.   On: 02/15/2022 23:12    EKG: I independently viewed the EKG done and my findings are as followed: EKG was not done in the ED  Assessment/Plan Present on Admission:  Biliary colic  Essential hypertension  Principal Problem:   Biliary colic Active Problems:   Essential hypertension   Leukocytosis   Thrombocytosis   GERD without esophagitis   Biliary colic Continue IV Dilaudid 0.5 mg every 3 hours as needed for moderate/severe. Continue IV Toradol as needed Continue IV hydration Abdominal ultrasound will be done in the morning  Questionable acute diverticulitis CT abdomen and pelvis was suggestive of acute diverticulitis, however, patient denies any LLQ pain, fever or any other symptomatology. Continue to monitor patient clinically and treat accordingly  Leukocytosis/Thrombocytosis possibly reactive WBC 13.2, platelets 407 Continue to monitor platelet levels  Essential hypertension Continue losartan  GERD Continue Protonix   DVT prophylaxis: Lovenox  Code Status: Full code  Family Communication: None at  bedside  Consults: None  Severity of Illness: The appropriate patient status for this patient is OBSERVATION. Observation status is judged to be reasonable and necessary in order to provide the required intensity of service to ensure the patient's safety. The patient's presenting symptoms, physical exam findings, and initial radiographic and laboratory data in the context of their medical condition is felt to place them at decreased risk for further clinical deterioration. Furthermore, it is anticipated that the patient will be medically stable for discharge from the hospital within 2 midnights of admission.   Author: Bernadette Hoit, DO 02/16/2022 1:15 AM  For on call review [email or web address].

## 2022-02-17 ENCOUNTER — Inpatient Hospital Stay (HOSPITAL_COMMUNITY): Payer: 59

## 2022-02-17 ENCOUNTER — Encounter (HOSPITAL_COMMUNITY): Payer: Self-pay | Admitting: Internal Medicine

## 2022-02-17 DIAGNOSIS — D75839 Thrombocytosis, unspecified: Secondary | ICD-10-CM | POA: Diagnosis not present

## 2022-02-17 DIAGNOSIS — R1110 Vomiting, unspecified: Secondary | ICD-10-CM

## 2022-02-17 DIAGNOSIS — K5792 Diverticulitis of intestine, part unspecified, without perforation or abscess without bleeding: Secondary | ICD-10-CM | POA: Diagnosis not present

## 2022-02-17 DIAGNOSIS — K8050 Calculus of bile duct without cholangitis or cholecystitis without obstruction: Secondary | ICD-10-CM | POA: Diagnosis not present

## 2022-02-17 LAB — CBC
HCT: 42.2 % (ref 36.0–46.0)
Hemoglobin: 12.9 g/dL (ref 12.0–15.0)
MCH: 28.5 pg (ref 26.0–34.0)
MCHC: 30.6 g/dL (ref 30.0–36.0)
MCV: 93.2 fL (ref 80.0–100.0)
Platelets: 315 10*3/uL (ref 150–400)
RBC: 4.53 MIL/uL (ref 3.87–5.11)
RDW: 13.1 % (ref 11.5–15.5)
WBC: 10 10*3/uL (ref 4.0–10.5)
nRBC: 0 % (ref 0.0–0.2)

## 2022-02-17 LAB — COMPREHENSIVE METABOLIC PANEL
ALT: 10 U/L (ref 0–44)
AST: 20 U/L (ref 15–41)
Albumin: 3.6 g/dL (ref 3.5–5.0)
Alkaline Phosphatase: 85 U/L (ref 38–126)
Anion gap: 12 (ref 5–15)
BUN: 12 mg/dL (ref 8–23)
CO2: 23 mmol/L (ref 22–32)
Calcium: 9.1 mg/dL (ref 8.9–10.3)
Chloride: 105 mmol/L (ref 98–111)
Creatinine, Ser: 0.45 mg/dL (ref 0.44–1.00)
GFR, Estimated: >60 mL/min (ref >60)
Glucose, Bld: 95 mg/dL (ref 70–99)
Potassium: 3.7 mmol/L (ref 3.5–5.1)
Sodium: 140 mmol/L (ref 135–145)
Total Bilirubin: 0.8 mg/dL (ref 0.3–1.2)
Total Protein: 7.1 g/dL (ref 6.5–8.1)

## 2022-02-17 MED ORDER — TECHNETIUM TC 99M MEBROFENIN IV KIT
5.0000 | PACK | Freq: Once | INTRAVENOUS | Status: AC | PRN
  Administered 2022-02-17: 5.5 via INTRAVENOUS

## 2022-02-17 MED ORDER — SINCALIDE 5 MCG IJ SOLR
INTRAMUSCULAR | Status: AC
  Administered 2022-02-17: 1.43 ug via INTRAVENOUS
  Filled 2022-02-17: qty 5

## 2022-02-17 MED ORDER — STERILE WATER FOR INJECTION IJ SOLN
INTRAMUSCULAR | Status: AC
  Administered 2022-02-17: 1.43 mL via INTRAVENOUS
  Filled 2022-02-17: qty 10

## 2022-02-17 NOTE — TOC Progression Note (Signed)
  Transition of Care [facility]) Screening Note   Patient Details  Name: Claudia Murphy Date of Birth: [birth year]   Transition of Care [facility]) CM/SW Contact:    Boneta Lucks, RN Phone Number: 02/17/2022, 10:04 AM   Transition of Care Department [facility]) has reviewed patient and no TOC needs have been identified at this time. We will continue to monitor patient advancement through interdisciplinary progression rounds. If new patient transition needs arise, please place a TOC consult.     Barriers to Discharge: Continued Medical Work up  Expected Discharge Plan and Services      Living arrangements for the past 2 months: [place] Determinants of Health (SDOH) Interventions [place]: No Food Insecurity (02/16/2022)  Housing: Low Risk  (02/16/2022)  Transportation Needs: No Transportation Needs (02/16/2022)  Utilities: Not At Risk (02/16/2022)  Alcohol Screen: Low Risk  (12/24/2020)  Depression (PHQ2-9): Low Risk  (12/24/2020)  Financial Resource Strain: Low Risk  (12/24/2020)  Physical Activity: Inactive (12/24/2020)  Social Connections: Unknown (12/24/2020)  Stress: No Stress Concern Present (12/24/2020)  Tobacco Use: Medium Risk (02/17/2022)

## 2022-02-17 NOTE — Progress Notes (Signed)
[facility]  Patient with intractable, nausea, vomiting right upper quadrant pain; hida with Biliary dyskinesia.  I will see Patient in person on Wednesday. Plan for robotic, assisted laparoscopic, cholecystectomy Thursday pending no changes. Diet as tolerated for now Antibiotics for possible diverticulitis but unlikely given no pain in LLQ  [name], MD

## 2022-02-17 NOTE — Progress Notes (Signed)
PROGRESS NOTE  Claudia Murphy [facility]:[id number] DOB: [birth year] DOA: 02/15/2022 PCP: [employer], MD  Brief History:  67 year old female with a history of GERD and hypertension presenting with right upper quadrant abdominal pain that began in the afternoon of 02/15/2022 after eating at a restaurant.  She had an episode of nausea and vomiting.  She states that she had been in her usual state of health without any issues prior to this episode.  She had denied any fevers, chills, chest pain, shortness of breath, coughing, hemoptysis, dysuria, hematuria.  She has not had any diarrhea, hematochezia, melena.  She states that she was started on Celebrex about 1 month prior to this admission with no other new medications.  She tried taking some over-the-counter remedies without relief of abdominal pain.  As result, she presented for further evaluation and treatment. In the ED, the patient was afebrile and hemodynamically stable with oxygen saturation 99% on room air.  WBC 14.9, hemoglobin 12.5, platelets 370,000.  Sodium 140, potassium 3.8, bicarbonate 26, serum creatinine 0.48, AST 25, ALT 15, alk phosphatase 88, total bilirubin 0.6. CT of the abdomen and pelvis showed scattered colon diverticula with focal wall thickening in the sigmoid colon and adjacent fat stranding.  This was concerning for diverticulitis.  There is no perforation or abscess.  There is mildly prominent extrahepatic biliary common bile duct.  There were no gallstones or gallbladder wall thickening noted.  UA was negative for any pyuria.  The patient was started on intravenous hydromorphone for pain and admitted for further evaluation and treatment.   Assessment/Plan: Abdominal pain/biliary dyskinesia -Likely multifactorial including diverticulitis and possible biliary colic -LFTs were unremarkable with normal total bilirubin -Lipase 32 -Obtain abdominal ultrasound--no acute findings -Continue IV hydromorphone as needed  pain -HIDA--patent ducts, EF 14% -general surgery consult   Diverticulitis -Start ceftriaxone and metronidazole>>stopped due to persistent n/v -started on merrem -Clear liquid diet for now   Personal history of penicillin allergy -Discussed with the patient -She states that she was able to take amoxicillin in the recent past without difficulty -Continue merrem--tolerating   Essential hypertension -Continue losartan   GERD -Continue pantoprazole         Family Communication: son at bedside updated   Consultants:  none   Code Status:  FULL    DVT Prophylaxis:  [place] Lovenox     Procedures: As Listed in Progress Note Above   Antibiotics: Ceftriaxone 1/1 Metronidazole 1/1 Merrem 1/1>>       Subjective:  Patient states RUQ pain is 50% better.  Denies f/c, cp, sob, n/v/d Objective: Vitals:   02/16/22 1537 02/16/22 1829 02/16/22 2049 02/17/22 0354  BP: 139/82 137/73 128/73 127/77  Pulse: 98 99 (!) 104 99  Resp:  _0 Temp: 98.7 F (37.1 C)  98.1 F (36.7 C) 98 F (36.7 C)  TempSrc: Oral     SpO2: 96% 98% 97% 96%  Weight:      Height:        Intake/Output Summary (Last 24 hours) at 02/17/2022 1314 Last data filed at 02/17/2022 0500 Gross per 24 hour  Intake 1665.17 ml  Output --  Net 1665.17 ml   Weight change: 1.093 kg Exam:  General:  Pt is alert, follows commands appropriately, not in acute distress HEENT: No icterus, No thrush, No neck mass, [place]/AT Cardiovascular: RRR, S1/S2, no rubs, no gallops Respiratory: CTA bilaterally, no wheezing, no crackles, no rhonchi Abdomen: Soft/+BS, mild  RUQ tender, non distended, no guarding Extremities: No edema, No lymphangitis, No petechiae, No rashes, no synovitis   Data Reviewed: I have personally reviewed following labs and imaging studies Basic Metabolic Panel: Recent Labs  Lab 02/15/22 2122 02/16/22 0241 02/17/22 1034  NA 140 140 140  K 4.0 3.8 3.7  CL 102 106 105  CO2 _0 GLUCOSE 115*  114* 95  BUN _1 CREATININE 0.49 0.48 0.45  CALCIUM 9.6 9.3 9.1  MG  --  2.0  --   PHOS  --  3.4  --    Liver Function Tests: Recent Labs  Lab 02/15/22 2122 02/16/22 0241 02/17/22 1034  AST _2 ALT _3 ALKPHOS 96 88 85  BILITOT 0.4 0.6 0.8  PROT 8.0 7.4 7.1  ALBUMIN 4.0 3.7 3.6   Recent Labs  Lab 02/15/22 2122  LIPASE 32   No results for input(s): "AMMONIA" in the last 168 hours. Coagulation Profile: No results for input(s): "INR", "PROTIME" in the last 168 hours. CBC: Recent Labs  Lab 02/15/22 2122 02/16/22 0241 02/17/22 1034  WBC 13.2* 14.9* 10.0  NEUTROABS 8.8*  --   --   HGB 13.2 12.5 12.9  HCT 41.9 38.9 42.2  MCV 89.5 88.2 93.2  PLT 407* 370 315   Cardiac Enzymes: No results for input(s): "CKTOTAL", "CKMB", "CKMBINDEX", "TROPONINI" in the last 168 hours. BNP: Invalid input(s): "POCBNP" CBG: No results for input(s): "GLUCAP" in the last 168 hours. HbA1C: No results for input(s): "HGBA1C" in the last 72 hours. Urine analysis:    Component Value Date/Time   COLORURINE YELLOW 02/15/2022 2122   APPEARANCEUR CLEAR 02/15/2022 2122   LABSPEC 1.017 02/15/2022 2122   PHURINE 6.0 02/15/2022 2122   GLUCOSEU NEGATIVE 02/15/2022 2122   HGBUR NEGATIVE 02/15/2022 2122   BILIRUBINUR NEGATIVE 02/15/2022 2122   KETONESUR NEGATIVE 02/15/2022 2122   PROTEINUR NEGATIVE 02/15/2022 2122   UROBILINOGEN 0.2 01/20/2012 1020   NITRITE NEGATIVE 02/15/2022 2122   LEUKOCYTESUR TRACE (A) 02/15/2022 2122   Sepsis Labs: _4 (procalcitonin:4,lacticidven:4) ) Recent Results (from the past 240 hour(s))  Resp panel by RT-PCR (RSV, Flu A&B, Covid) Anterior Nasal Swab     Status: None   Collection Time: 02/16/22  3:21 PM   Specimen: Anterior Nasal Swab  Result Value Ref Range Status   SARS Coronavirus 2 by RT PCR NEGATIVE NEGATIVE Final    Comment: (NOTE) SARS-CoV-2 target nucleic acids are NOT DETECTED.  The SARS-CoV-2 RNA is generally detectable in  upper respiratory specimens during the acute phase of infection. The lowest concentration of SARS-CoV-2 viral copies this assay can detect is 138 copies/mL. A negative result does not preclude SARS-Cov-2 infection and should not be used as the sole basis for treatment or other patient management decisions. A negative result may occur with  improper specimen collection/handling, submission of specimen other than nasopharyngeal swab, presence of viral mutation(s) within the areas targeted by this assay, and inadequate number of viral copies(<138 copies/mL). A negative result must be combined with clinical observations, patient history, and epidemiological information. The expected result is Negative.  Fact Sheet for Patients:  EntrepreneurPulse.com.au  Fact Sheet for Healthcare Providers:  IncredibleEmployment.be  This test is no t yet approved or cleared by the [place] FDA and  has been authorized for detection and/or diagnosis of SARS-CoV-2 by FDA under an Emergency Use Authorization (EUA). This EUA will remain  in effect (meaning this test can be used) for the  duration of the COVID-19 declaration under Section 564(b)(1) of the Act, 21 U.S.C.section 360bbb-3(b)(1), unless the authorization is terminated  or revoked sooner.       Influenza A by PCR NEGATIVE NEGATIVE Final   Influenza B by PCR NEGATIVE NEGATIVE Final    Comment: (NOTE) The Xpert Xpress SARS-CoV-2/FLU/RSV plus assay is intended as an aid in the diagnosis of influenza from Nasopharyngeal swab specimens and should not be used as a sole basis for treatment. Nasal washings and aspirates are unacceptable for Xpert Xpress SARS-CoV-2/FLU/RSV testing.  Fact Sheet for Patients: EntrepreneurPulse.com.au  Fact Sheet for Healthcare Providers: IncredibleEmployment.be  This test is not yet approved or cleared by the [place] FDA and has been  authorized for detection and/or diagnosis of SARS-CoV-2 by FDA under an Emergency Use Authorization (EUA). This EUA will remain in effect (meaning this test can be used) for the duration of the COVID-19 declaration under Section 564(b)(1) of the Act, 21 U.S.C. section 360bbb-3(b)(1), unless the authorization is terminated or revoked.     Resp Syncytial Virus by PCR NEGATIVE NEGATIVE Final    Comment: (NOTE) Fact Sheet for Patients: EntrepreneurPulse.com.au  Fact Sheet for Healthcare Providers: IncredibleEmployment.be  This test is not yet approved or cleared by the [place] FDA and has been authorized for detection and/or diagnosis of SARS-CoV-2 by FDA under an Emergency Use Authorization (EUA). This EUA will remain in effect (meaning this test can be used) for the duration of the COVID-19 declaration under Section 564(b)(1) of the Act, 21 U.S.C. section 360bbb-3(b)(1), unless the authorization is terminated or revoked.  Performed at [facility], [address], [place], [place] [address]   Respiratory (~20 pathogens) panel by PCR     Status: None   Collection Time: 02/16/22  3:21 PM  Result Value Ref Range Status   Adenovirus NOT DETECTED NOT DETECTED Final   Coronavirus 229E NOT DETECTED NOT DETECTED Final    Comment: (NOTE) The Coronavirus on the Respiratory Panel, DOES NOT test for the novel  Coronavirus (2019 nCoV)    Coronavirus HKU1 NOT DETECTED NOT DETECTED Final   Coronavirus NL63 NOT DETECTED NOT DETECTED Final   Coronavirus OC43 NOT DETECTED NOT DETECTED Final   Metapneumovirus NOT DETECTED NOT DETECTED Final   Rhinovirus / Enterovirus NOT DETECTED NOT DETECTED Final   Influenza A NOT DETECTED NOT DETECTED Final   Influenza B NOT DETECTED NOT DETECTED Final   Parainfluenza Virus 1 NOT DETECTED NOT DETECTED Final   Parainfluenza Virus 2 NOT DETECTED NOT DETECTED Final   Parainfluenza Virus 3 NOT DETECTED NOT DETECTED Final    Parainfluenza Virus 4 NOT DETECTED NOT DETECTED Final   Respiratory Syncytial Virus NOT DETECTED NOT DETECTED Final   Bordetella pertussis NOT DETECTED NOT DETECTED Final   Bordetella Parapertussis NOT DETECTED NOT DETECTED Final   Chlamydophila pneumoniae NOT DETECTED NOT DETECTED Final   Mycoplasma pneumoniae NOT DETECTED NOT DETECTED Final    Comment: Performed at [facility], [place] [address], [place], [place] [address]     Scheduled Meds:  enoxaparin (LOVENOX) injection  40 mg Subcutaneous Q24H   ondansetron (ZOFRAN) IV  4 mg Intravenous Q6H   pantoprazole (PROTONIX) IV  40 mg Intravenous Q12H   Continuous Infusions:  lactated ringers 125 mL/hr at 02/16/22 1706   meropenem (MERREM) IV 1 g (02/17/22 0553)    Procedures/Studies: NM Hepato W/EF  Result Date: 02/17/2022 CLINICAL DATA:  Right upper quadrant pain. Biliary disease suspected. EXAM: NUCLEAR MEDICINE HEPATOBILIARY IMAGING WITH  GALLBLADDER EF TECHNIQUE: Sequential images of the abdomen were obtained out to 60 minutes following intravenous administration of radiopharmaceutical. After slow intravenous infusion of 1.43 micrograms Cholecystokinin, gallbladder ejection fraction was determined. RADIOPHARMACEUTICALS:  5.5 mCi Tc-70mCholetec IV COMPARISON:  Abdominal ultrasound 02/16/2022. Abdomen/pelvis CT 02/15/2022 FINDINGS: Prompt uptake and biliary excretion of activity by the liver is seen. Gallbladder activity is visualized, consistent with patency of cystic duct. Biliary activity passes into small bowel, consistent with patent common bile duct. Calculated gallbladder ejection fraction is 14%. (At 60 min, normal ejection fraction is greater than 40%.) IMPRESSION: 1. Normal hepatobiliary patency study. 2. Abnormally decreased gallbladder ejection fraction. Electronically Signed   By: EMisty StanleyM.D.   On: 02/17/2022 10:53   [place]domen Limited  Result Date: 02/16/2022 CLINICAL DATA:  [age]year old female with history of  right upper quadrant abdominal pain for the past day. EXAM: ULTRASOUND ABDOMEN LIMITED RIGHT UPPER QUADRANT COMPARISON:  None Available. FINDINGS: Gallbladder: No gallstones or wall thickening visualized. No sonographic Murphy sign noted by sonographer. Common bile duct: Diameter: 4 mm in the porta hepatis Liver: No focal lesion identified. Within normal limits in parenchymal echogenicity. Portal vein is patent on color Doppler imaging with normal direction of blood flow towards the liver. Other: None. IMPRESSION: 1. No acute findings. Specifically, no gallstones or findings to suggest an acute cholecystitis are noted at this time. Electronically Signed   By: DVinnie LangtonM.D.   On: 02/16/2022 08:44   CT ABDOMEN PELVIS W CONTRAST  Result Date: 02/15/2022 CLINICAL DATA:  Abdominal pain, acute, nonlocalized. EXAM: CT ABDOMEN AND PELVIS WITH CONTRAST TECHNIQUE: Multidetector CT imaging of the abdomen and pelvis was performed using the standard protocol following bolus administration of intravenous contrast. RADIATION DOSE REDUCTION: This exam was performed according to the departmental dose-optimization program which includes automated exposure control, adjustment of the mA and/or kV according to patient size and/or use of iterative reconstruction technique. CONTRAST:  1088mOMNIPAQUE IOHEXOL 300 MG/ML  SOLN COMPARISON:  None Available. FINDINGS: Lower chest: No acute abnormality. Hepatobiliary: No focal liver abnormality is seen. No gallstones, gallbladder wall thickening, or biliary dilatation. Prominent extrahepatic common duct measuring up to 6 mm without evidence of calculus. Pancreas: Moderate generalized pancreatic atrophy. No pancreatic ductal dilatation or surrounding inflammatory changes. Spleen: Normal in size without focal abnormality. Adrenals/Urinary Tract: Adrenal glands are unremarkable. Exophytic simple cysts in the upper pole of the left kidney. Small 3-4 mm cyst in the upper pole of the  right kidney. No evidence of hydronephrosis or ureteral calculus. Bladder is unremarkable. Stomach/Bowel: Stomach is within normal limits. Appendix appears normal. Scattered colonic diverticuli. Focal wall thickening in the sigmoid colon with mild adjacent fat stranding concerning for acute diverticulitis. Vascular/Lymphatic: Mild aortic atherosclerosis. No enlarged abdominal or pelvic lymph nodes. Reproductive: Uterus and bilateral adnexa are unremarkable. Other: Small fat containing umbilical hernia. No abdominopelvic ascites. 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 Dextroscoliosis centered at L2-L3 vertebral bodies. IMPRESSION: 1. Scattered colonic diverticula with focal wall thickening in the sigmoid colon and mild adjacent fat stranding concerning for acute diverticulitis. No evidence of perforation or abscess. 2. Mildly prominent extrahepatic common bile duct without evidence of biliary calculus. Clinical correlation is suggested. If there is high clinical concern for biliary obstruction, MRCP could be considered for further evaluation. 3. Moderate generalized pancreatic atrophy. 4. 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 5. Small fat containing umbilical hernia. Aortic Atherosclerosis (ICD10-I70.0).  Electronically Signed   By: Keane Police D.O.   On: 02/15/2022 23:12    Orson Eva, DO  [facility]  If 7PM-7AM, please contact night-coverage [email or web address] Password [email or web address] 02/17/2022, 1:14 PM   LOS: 1 day

## 2022-02-17 NOTE — Plan of Care (Signed)

## 2022-02-18 DIAGNOSIS — K8050 Calculus of bile duct without cholangitis or cholecystitis without obstruction: Secondary | ICD-10-CM | POA: Diagnosis not present

## 2022-02-18 DIAGNOSIS — K219 Gastro-esophageal reflux disease without esophagitis: Secondary | ICD-10-CM | POA: Diagnosis not present

## 2022-02-18 DIAGNOSIS — R1110 Vomiting, unspecified: Secondary | ICD-10-CM | POA: Diagnosis not present

## 2022-02-18 DIAGNOSIS — K5792 Diverticulitis of intestine, part unspecified, without perforation or abscess without bleeding: Secondary | ICD-10-CM | POA: Diagnosis not present

## 2022-02-18 MED ORDER — INDOCYANINE GREEN 25 MG IV SOLR: 2.5000 mg | Freq: Once | INTRAVENOUS | Status: AC

## 2022-02-18 MED ORDER — OXYCODONE HCL 5 MG PO TABS: 5.0000 mg | ORAL_TABLET | ORAL | Status: DC | PRN

## 2022-02-18 MED ORDER — BOOST / RESOURCE BREEZE PO LIQD CUSTOM
1.0000 | Freq: Three times a day (TID) | ORAL | Status: DC
  Administered 2022-02-18: 1 via ORAL

## 2022-02-18 MED ORDER — CHLORHEXIDINE GLUCONATE CLOTH 2 % EX PADS
6.0000 | MEDICATED_PAD | Freq: Once | CUTANEOUS | Status: AC
  Administered 2022-02-18: 6 via TOPICAL

## 2022-02-18 MED ORDER — SODIUM CHLORIDE 0.9 % IV SOLN
1.0000 g | INTRAVENOUS | Status: AC
  Administered 2022-02-19: 1 g via INTRAVENOUS
  Filled 2022-02-18: qty 1

## 2022-02-18 MED ORDER — MORPHINE SULFATE (PF) 2 MG/ML IV SOLN: 2.0000 mg | INTRAVENOUS | Status: DC | PRN

## 2022-02-18 MED ORDER — KETOROLAC TROMETHAMINE 15 MG/ML IJ SOLN: 15.0000 mg | Freq: Four times a day (QID) | INTRAMUSCULAR | Status: DC | PRN

## 2022-02-18 MED ORDER — LOSARTAN POTASSIUM 50 MG PO TABS
25.0000 mg | ORAL_TABLET | Freq: Every day | ORAL | Status: DC
  Administered 2022-02-18 – 2022-02-20 (×2): 25 mg via ORAL
  Filled 2022-02-18 (×3): qty 1

## 2022-02-18 NOTE — Progress Notes (Signed)
PROGRESS NOTE  Claudia Murphy ESP:[id number] DOB: [birth year] DOA: 02/15/2022 PCP: [employer], MD  Brief History:  67 year old female with a history of GERD and hypertension presenting with right upper quadrant abdominal pain that began in the afternoon of 02/15/2022 after eating at a restaurant.  She had an episode of nausea and vomiting.  She states that she had been in her usual state of health without any issues prior to this episode.  She had denied any fevers, chills, chest pain, shortness of breath, coughing, hemoptysis, dysuria, hematuria.  She has not had any diarrhea, hematochezia, melena.  She states that she was started on Celebrex about 1 month prior to this admission with no other new medications.  She tried taking some over-the-counter remedies without relief of abdominal pain.  As result, she presented for further evaluation and treatment. In the ED, the patient was afebrile and hemodynamically stable with oxygen saturation 99% on room air.  WBC 14.9, hemoglobin 12.5, platelets 370,000.  Sodium 140, potassium 3.8, bicarbonate 26, serum creatinine 0.48, AST 25, ALT 15, alk phosphatase 88, total bilirubin 0.6. CT of the abdomen and pelvis showed scattered colon diverticula with focal wall thickening in the sigmoid colon and adjacent fat stranding.  This was concerning for diverticulitis.  There is no perforation or abscess.  There is mildly prominent extrahepatic biliary common bile duct.  There were no gallstones or gallbladder wall thickening noted.  UA was negative for any pyuria.  The patient was started on intravenous hydromorphone for pain and admitted for further evaluation and treatment.   Assessment/Plan: Abdominal pain/biliary dyskinesia -Likely multifactorial including diverticulitis and possible biliary colic -LFTs were unremarkable with normal total bilirubin -Lipase 32 -Obtain abdominal ultrasound--no acute findings -Continue IV hydromorphone as needed  pain -HIDA--patent ducts, EF 14% -general surgery consult with plans to take to OR on 1/4   Diverticulitis -Start ceftriaxone and metronidazole>>stopped due to persistent n/v -started on meropenem -Clear liquid diet for now   Personal history of penicillin allergy -Discussed with the patient -She states that she was able to take amoxicillin in the recent past without difficulty -Continue merrem--tolerating   Essential hypertension -Continue losartan   GERD -Continue pantoprazole      Family Communication: son at bedside updated 1/3   Consultants:  none   Code Status:  FULL    DVT Prophylaxis:  [place] Lovenox     Procedures: As Listed in Progress Note Above   Antibiotics: Merrem 1/1>>   Subjective: Remains unchanged, anxious to have surgery done to remove gallbladder and get back home;   Objective: Vitals:   02/17/22 0354 02/17/22 1423 02/17/22 2027 02/18/22 0512  BP: 127/77 (!) 154/76 (!) 140/87 (!) 141/76  Pulse: 99 (!) 107 90 97  Resp: _0 Temp: 98 F (36.7 C) 98.3 F (36.8 C) 98 F (36.7 C) 98.2 F (36.8 C)  TempSrc:  Oral Oral   SpO2: 96% 97% 97% 96%  Weight:      Height:        Intake/Output Summary (Last 24 hours) at 02/18/2022 1214 Last data filed at 02/18/2022 0900 Gross per 24 hour  Intake 720 ml  Output --  Net 720 ml   Weight change:  Exam:  General:  Pt is alert, follows commands appropriately, not in acute distress HEENT: No icterus, No thrush, No neck mass, [place]/AT Cardiovascular: RRR, S1/S2, no rubs, no gallops Respiratory: CTA bilaterally, no wheezing, no crackles, no rhonchi Abdomen: Soft/+BS,  mild RUQ tender, non distended, no guarding Extremities: No edema, No lymphangitis, No petechiae, No rashes, no synovitis  Data Reviewed: I have personally reviewed following labs and imaging studies Basic Metabolic Panel: Recent Labs  Lab 02/15/22 2122 02/16/22 0241 02/17/22 1034  NA 140 140 140  K 4.0 3.8 3.7  CL 102 106 105   CO2 _0 GLUCOSE 115* 114* 95  BUN _1 CREATININE 0.49 0.48 0.45  CALCIUM 9.6 9.3 9.1  MG  --  2.0  --   PHOS  --  3.4  --    Liver Function Tests: Recent Labs  Lab 02/15/22 2122 02/16/22 0241 02/17/22 1034  AST _2 ALT _3 ALKPHOS 96 88 85  BILITOT 0.4 0.6 0.8  PROT 8.0 7.4 7.1  ALBUMIN 4.0 3.7 3.6   Recent Labs  Lab 02/15/22 2122  LIPASE 32   No results for input(s): "AMMONIA" in the last 168 hours. Coagulation Profile: No results for input(s): "INR", "PROTIME" in the last 168 hours. CBC: Recent Labs  Lab 02/15/22 2122 02/16/22 0241 02/17/22 1034  WBC 13.2* 14.9* 10.0  NEUTROABS 8.8*  --   --   HGB 13.2 12.5 12.9  HCT 41.9 38.9 42.2  MCV 89.5 88.2 93.2  PLT 407* 370 315   Cardiac Enzymes: No results for input(s): "CKTOTAL", "CKMB", "CKMBINDEX", "TROPONINI" in the last 168 hours. BNP: Invalid input(s): "POCBNP" CBG: No results for input(s): "GLUCAP" in the last 168 hours. HbA1C: No results for input(s): "HGBA1C" in the last 72 hours. Urine analysis:    Component Value Date/Time   COLORURINE YELLOW 02/15/2022 2122   APPEARANCEUR CLEAR 02/15/2022 2122   LABSPEC 1.017 02/15/2022 2122   PHURINE 6.0 02/15/2022 2122   GLUCOSEU NEGATIVE 02/15/2022 2122   HGBUR NEGATIVE 02/15/2022 2122   BILIRUBINUR NEGATIVE 02/15/2022 2122   KETONESUR NEGATIVE 02/15/2022 2122   PROTEINUR NEGATIVE 02/15/2022 2122   UROBILINOGEN 0.2 01/20/2012 1020   NITRITE NEGATIVE 02/15/2022 2122   LEUKOCYTESUR TRACE (A) 02/15/2022 2122    Recent Results (from the past 240 hour(s))  Resp panel by RT-PCR (RSV, Flu A&B, Covid) Anterior Nasal Swab     Status: None   Collection Time: 02/16/22  3:21 PM   Specimen: Anterior Nasal Swab  Result Value Ref Range Status   SARS Coronavirus 2 by RT PCR NEGATIVE NEGATIVE Final    Comment: (NOTE) SARS-CoV-2 target nucleic acids are NOT DETECTED.  The SARS-CoV-2 RNA is generally detectable in upper respiratory specimens  during the acute phase of infection. The lowest concentration of SARS-CoV-2 viral copies this assay can detect is 138 copies/mL. A negative result does not preclude SARS-Cov-2 infection and should not be used as the sole basis for treatment or other patient management decisions. A negative result may occur with  improper specimen collection/handling, submission of specimen other than nasopharyngeal swab, presence of viral mutation(s) within the areas targeted by this assay, and inadequate number of viral copies(<138 copies/mL). A negative result must be combined with clinical observations, patient history, and epidemiological information. The expected result is Negative.  Fact Sheet for Patients:  EntrepreneurPulse.com.au  Fact Sheet for Healthcare Providers:  IncredibleEmployment.be  This test is no t yet approved or cleared by the [place] FDA and  has been authorized for detection and/or diagnosis of SARS-CoV-2 by FDA under an Emergency Use Authorization (EUA). This EUA will remain  in effect (meaning this test can be used) for the duration of the  COVID-19 declaration under Section 564(b)(1) of the Act, 21 U.S.C.section 360bbb-3(b)(1), unless the authorization is terminated  or revoked sooner.       Influenza A by PCR NEGATIVE NEGATIVE Final   Influenza B by PCR NEGATIVE NEGATIVE Final    Comment: (NOTE) The Xpert Xpress SARS-CoV-2/FLU/RSV plus assay is intended as an aid in the diagnosis of influenza from Nasopharyngeal swab specimens and should not be used as a sole basis for treatment. Nasal washings and aspirates are unacceptable for Xpert Xpress SARS-CoV-2/FLU/RSV testing.  Fact Sheet for Patients: EntrepreneurPulse.com.au  Fact Sheet for Healthcare Providers: IncredibleEmployment.be  This test is not yet approved or cleared by the [place] FDA and has been authorized for detection  and/or diagnosis of SARS-CoV-2 by FDA under an Emergency Use Authorization (EUA). This EUA will remain in effect (meaning this test can be used) for the duration of the COVID-19 declaration under Section 564(b)(1) of the Act, 21 U.S.C. section 360bbb-3(b)(1), unless the authorization is terminated or revoked.     Resp Syncytial Virus by PCR NEGATIVE NEGATIVE Final    Comment: (NOTE) Fact Sheet for Patients: EntrepreneurPulse.com.au  Fact Sheet for Healthcare Providers: IncredibleEmployment.be  This test is not yet approved or cleared by the [place] FDA and has been authorized for detection and/or diagnosis of SARS-CoV-2 by FDA under an Emergency Use Authorization (EUA). This EUA will remain in effect (meaning this test can be used) for the duration of the COVID-19 declaration under Section 564(b)(1) of the Act, 21 U.S.C. section 360bbb-3(b)(1), unless the authorization is terminated or revoked.  Performed at [facility], [address], [place], [place] [address]   Respiratory (~20 pathogens) panel by PCR     Status: None   Collection Time: 02/16/22  3:21 PM  Result Value Ref Range Status   Adenovirus NOT DETECTED NOT DETECTED Final   Coronavirus 229E NOT DETECTED NOT DETECTED Final    Comment: (NOTE) The Coronavirus on the Respiratory Panel, DOES NOT test for the novel  Coronavirus (2019 nCoV)    Coronavirus HKU1 NOT DETECTED NOT DETECTED Final   Coronavirus NL63 NOT DETECTED NOT DETECTED Final   Coronavirus OC43 NOT DETECTED NOT DETECTED Final   Metapneumovirus NOT DETECTED NOT DETECTED Final   Rhinovirus / Enterovirus NOT DETECTED NOT DETECTED Final   Influenza A NOT DETECTED NOT DETECTED Final   Influenza B NOT DETECTED NOT DETECTED Final   Parainfluenza Virus 1 NOT DETECTED NOT DETECTED Final   Parainfluenza Virus 2 NOT DETECTED NOT DETECTED Final   Parainfluenza Virus 3 NOT DETECTED NOT DETECTED Final   Parainfluenza Virus 4  NOT DETECTED NOT DETECTED Final   Respiratory Syncytial Virus NOT DETECTED NOT DETECTED Final   Bordetella pertussis NOT DETECTED NOT DETECTED Final   Bordetella Parapertussis NOT DETECTED NOT DETECTED Final   Chlamydophila pneumoniae NOT DETECTED NOT DETECTED Final   Mycoplasma pneumoniae NOT DETECTED NOT DETECTED Final    Comment: Performed at [facility], [place] [address], [place],  [address]    Scheduled Meds:  enoxaparin (LOVENOX) injection  40 mg Subcutaneous Q24H   feeding supplement  1 Container Oral TID BM   losartan  25 mg Oral Daily   ondansetron (ZOFRAN) IV  4 mg Intravenous Q6H   pantoprazole (PROTONIX) IV  40 mg Intravenous Q12H   Continuous Infusions:  lactated ringers 125 mL/hr at 02/18/22 0150   meropenem (MERREM) IV 1 g (02/18/22 0514)    Procedures/Studies: NM Hepato W/EF  Result Date: 02/17/2022 CLINICAL DATA:  Right upper quadrant pain. Biliary disease suspected. EXAM: NUCLEAR MEDICINE HEPATOBILIARY IMAGING WITH GALLBLADDER EF TECHNIQUE: Sequential images of the abdomen were obtained out to 60 minutes following intravenous administration of radiopharmaceutical. After slow intravenous infusion of 1.43 micrograms Cholecystokinin, gallbladder ejection fraction was determined. RADIOPHARMACEUTICALS:  5.5 mCi Tc-27mCholetec IV COMPARISON:  Abdominal ultrasound 02/16/2022. Abdomen/pelvis CT 02/15/2022 FINDINGS: Prompt uptake and biliary excretion of activity by the liver is seen. Gallbladder activity is visualized, consistent with patency of cystic duct. Biliary activity passes into small bowel, consistent with patent common bile duct. Calculated gallbladder ejection fraction is 14%. (At 60 min, normal ejection fraction is greater than 40%.) IMPRESSION: 1. Normal hepatobiliary patency study. 2. Abnormally decreased gallbladder ejection fraction. Electronically Signed   By: EMisty StanleyM.D.   On: 02/17/2022 10:53   [place]domen Limited  Result Date:  02/16/2022 CLINICAL DATA:  67year old female with history of right upper quadrant abdominal pain for the past day. EXAM: ULTRASOUND ABDOMEN LIMITED RIGHT UPPER QUADRANT COMPARISON:  None Available. FINDINGS: Gallbladder: No gallstones or wall thickening visualized. No sonographic Murphy sign noted by sonographer. Common bile duct: Diameter: 4 mm in the porta hepatis Liver: No focal lesion identified. Within normal limits in parenchymal echogenicity. Portal vein is patent on color Doppler imaging with normal direction of blood flow towards the liver. Other: None. IMPRESSION: 1. No acute findings. Specifically, no gallstones or findings to suggest an acute cholecystitis are noted at this time. Electronically Signed   By: DVinnie LangtonM.D.   On: 02/16/2022 08:44   CT ABDOMEN PELVIS W CONTRAST  Result Date: 02/15/2022 CLINICAL DATA:  Abdominal pain, acute, nonlocalized. EXAM: CT ABDOMEN AND PELVIS WITH CONTRAST TECHNIQUE: Multidetector CT imaging of the abdomen and pelvis was performed using the standard protocol following bolus administration of intravenous contrast. RADIATION DOSE REDUCTION: This exam was performed according to the departmental dose-optimization program which includes automated exposure control, adjustment of the mA and/or kV according to patient size and/or use of iterative reconstruction technique. CONTRAST:  107mOMNIPAQUE IOHEXOL 300 MG/ML  SOLN COMPARISON:  None Available. FINDINGS: Lower chest: No acute abnormality. Hepatobiliary: No focal liver abnormality is seen. No gallstones, gallbladder wall thickening, or biliary dilatation. Prominent extrahepatic common duct measuring up to 6 mm without evidence of calculus. Pancreas: Moderate generalized pancreatic atrophy. No pancreatic ductal dilatation or surrounding inflammatory changes. Spleen: Normal in size without focal abnormality. Adrenals/Urinary Tract: Adrenal glands are unremarkable. Exophytic simple cysts in the upper pole of the  left kidney. Small 3-4 mm cyst in the upper pole of the right kidney. No evidence of hydronephrosis or ureteral calculus. Bladder is unremarkable. Stomach/Bowel: Stomach is within normal limits. Appendix appears normal. Scattered colonic diverticuli. Focal wall thickening in the sigmoid colon with mild adjacent fat stranding concerning for acute diverticulitis. Vascular/Lymphatic: Mild aortic atherosclerosis. No enlarged abdominal or pelvic lymph nodes. Reproductive: Uterus and bilateral adnexa are unremarkable. Other: Small fat containing umbilical hernia. No abdominopelvic ascites. 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 Dextroscoliosis centered at L2-L3 vertebral bodies. IMPRESSION: 1. Scattered colonic diverticula with focal wall thickening in the sigmoid colon and mild adjacent fat stranding concerning for acute diverticulitis. No evidence of perforation or abscess. 2. Mildly prominent extrahepatic common bile duct without evidence of biliary calculus. Clinical correlation is suggested. If there is high clinical concern for biliary obstruction, MRCP could be considered for further evaluation. 3. Moderate generalized pancreatic atrophy. 4. 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 5. Small fat containing umbilical hernia. Aortic Atherosclerosis (ICD10-I70.0). Electronically Signed   By: Keane Police D.O.   On: 02/15/2022 23:12    Irwin Brakeman, MD  [facility]  If 7PM-7AM, please contact night-coverage [email or web address] Password [email or web address] 02/18/2022, 12:14 PM   LOS: 2 days

## 2022-02-18 NOTE — Progress Notes (Signed)
Initial Nutrition Assessment  DOCUMENTATION CODES:   Not applicable  INTERVENTION:  Boost Breeze po TID, each supplement provides 250 kcal and 9 grams of protein Monitor for diet advancement and order Ensure Enlive/High Protein (vanilla) once diet advanced to at least full liquids  NUTRITION DIAGNOSIS:   Increased nutrient needs related to acute illness as evidenced by estimated needs.   GOAL:   Patient will meet greater than or equal to 90% of their needs  MONITOR:   PO intake, Supplement acceptance, Diet advancement, Labs, Weight trends  REASON FOR ASSESSMENT:   Malnutrition Screening Tool    ASSESSMENT:   Pt admitted with abdominal pain secondary to biliary colic. PMH significant for HTN and GERD.  Surgery consulted. HIDA scan showing decreased EF suggesting biliary dyskinesia. Plans for lap chole on Thursday.   Pt sitting up in chair at time of visit. She reports that her baseline PO intake is typically 1 meal, on average, which she consumes after she gets off work in the evening. She does not have much of an appetite and eats when she feels hungry.   Pt reports that she has experienced a 25 lb weight loss since February which she initially suspected was d/t personal stressors of having experienced multiple moves within a short time frame, however upon further discussion with her son, she suspects it also may have been r/t underlying gallbladder issues.   Reviewed weight history. Last documented weight on file was 75.3 kg on 9/19. Current admit weight noted to be 71.4 kg. This is a weight loss of 5.2% which is not clinically significant for time frame.   Medications: zofran, protonix IV drips: LR @ 182m/hr  Labs: reviewed  NUTRITION - FOCUSED PHYSICAL EXAM:  Flowsheet Row Most Recent Value  Orbital Region Mild depletion  Upper Arm Region No depletion  Thoracic and Lumbar Region No depletion  Buccal Region No depletion  Temple Region No depletion  Clavicle  Bone Region No depletion  Clavicle and Acromion Bone Region No depletion  Scapular Bone Region No depletion  Dorsal Hand No depletion  Patellar Region No depletion  Anterior Thigh Region No depletion  Posterior Calf Region No depletion  Edema (RD Assessment) Mild  [RUE, BLE]  Hair Reviewed  Eyes Reviewed  Mouth Reviewed  Skin Reviewed  Nails Reviewed       Diet Order:   Diet Order             Diet clear liquid Room service appropriate? Yes; Fluid consistency: Thin  Diet effective now                   EDUCATION NEEDS:   No education needs have been identified at this time  Skin:  Skin Assessment: Reviewed RN Assessment  Last BM:  12/31  Height:   Ht Readings from Last 1 Encounters:  02/16/22 '5\' 1"'$  (1.549 m)    Weight:   Wt Readings from Last 1 Encounters:  02/16/22 71.4 kg   BMI:  Body mass index is 29.74 kg/m.  Estimated Nutritional Needs:   Kcal:  1650-1850  Protein:  85-100g  Fluid:  >/=1.7L  Claudia Murphy, Claudia Murphy Clinical Nutrition

## 2022-02-18 NOTE — H&P (Addendum)
I was present with the medical student for this service. I personally verified the history of present illness, performed the physical exam, and made the plan for this encounter. I have verified the medical student's documentation and made modifications where appropriately. I have personally documented in my own words a brief history, physical, and plan below.      Biliary dyskinesia on HIDA scan, remains tender and with intractable nausea and vomiting.   No LLQ pain, can stop the meropenem Personal reviewed CT and diverticula but under distended colon, do not think it is diverticulitis, and clinically is not acting like diverticulitis.  RUQ pain with palpation    PLAN: I counseled the patient about the indication, risks and benefits of robotic assisted laparoscopic cholecystectomy.  She understands there is a very small chance for bleeding, infection, injury to normal structures (including common bile duct), conversion to open surgery, persistent symptoms, evolution of postcholecystectomy diarrhea, need for secondary interventions, anesthesia reaction, cardiopulmonary issues and other risks not specifically detailed here. I described the expected recovery, the plan for follow-up and the restrictions during the recovery phase.  All questions were answered.  [name], MD [facility] [address] [place], [place] [address] [phone] (office)   Reason for Consult: Biliary dyskinesia  Referring Physician: Dr. Carles Collet  HPI: Claudia Murphy is an 67 y.o. female with a past medical history of left breast cancer s/p left mastectomy in 2007, HTN, GERD and DJD presenting for RUQ pain since Sunday, December 31. The patient reports on Sunday, December 31 she ate fried flounder, fried shrimp, and hush puppies at a restaurant for dinner. When she returned home, she developed constant, sharp, RUQ abdominal pain located beneath her ribs and on the right side of her back.  She took antacids and Pepto bismol with no relief of her pain. She also had nausea, and one episode of NBNB vomiting prior to admission. Denies fever, chills, recent illness, diarrhea or blood in her stool. She reports similar episodes of RUQ abdominal pain a year ago, which resolved spontaneously within hours.    Past Medical History:  Diagnosis Date   Anxiety    Arthritis    "all over my body" (01/25/2012)   Breast cancer ([place])    "left" (01/25/2012)   Breast disorder    breast left   Carpal tunnel syndrome on both sides    Cataract mature, total senile    "both" (01/25/2012)   Cataracts, bilateral    Chronic lower back pain    DJD (degenerative joint disease)    "neck, shoulders, back" (01/25/2012)   Family history of breast cancer    Family history of stomach cancer    ?niece? 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 Repeat screening colonoscopy in 5-10 years.   COLONOSCOPY WITH PROPOFOL N/A 10/18/2018   diverticulosis, torturous colon, hemorrhoids.   FOOT SURGERY     INJECTION KNEE  2013   lt   KNEE ARTHROSCOPY  1980's   "left" (01/25/2012)   MASTECTOMY  02/2005   left   PARTIAL KNEE ARTHROPLASTY  01/25/2012   Procedure: UNICOMPARTMENTAL KNEE;  Surgeon: Lorn Junes, MD;  Location:  [facility];  Service: Orthopedics;  Laterality: Left;  LEFT KNEE UNICOMPARTMENTAL ARTHROPLASTY    PERIPHERALLY INSERTED CENTRAL CATHETER INSERTION  2007   PLANTAR FASCIA SURGERY  11/25/2010   "left" (01/25/2012)   REPLACEMENT UNICONDYLAR JOINT KNEE  01/25/2012   "left" (01/25/2012)   THROMBECTOMY / EMBOLECTOMY SUBCLAVIAN ARTERY  2007   "post PICC line removal" (01/25/2012)   TUBAL LIGATION  1970's    Family History  Problem Relation Age of Onset   Diabetes Mother    Hypertension Mother    Heart disease Father    Hypertension Father    Diabetes Son    Diabetes Sister    Thyroid disease Sister    Cancer Sister        lung and brain   Diabetes Sister    Diabetes Sister    Breast cancer Sister 12   Diabetes Sister    COPD Brother    Hypertension Brother    Breast cancer Maternal Aunt 48   Stomach cancer Maternal Uncle    Cancer Other 35       possible uterine cancer   Colon cancer Neg Hx     Social History:  reports that she quit smoking about 17 years ago. Her smoking use included cigarettes. She has a 49.50 pack-year smoking history. She has never used smokeless tobacco. She reports that she does not drink alcohol and does not use drugs.  Allergies:  Allergies  Allergen Reactions   Epirubicin Other (See Comments)    Skin toxicity "hands started peeling and turning real red" (01/25/2012)   Latex Other (See Comments)    Blisters   Methocarbamol Shortness Of Breath   Penicillins Rash and Other (See Comments)    "turned real real red; took 1 pill and ended up in hospital for 2 wks; in the 1980's" (01/25/2012) Did it involve swelling of the face/tongue/throat, SOB, or low BP? No Did it involve sudden or severe rash/hives, skin peeling, or any reaction on the inside of your mouth or nose? Yes Did you need to seek medical attention at a hospital or doctor's office? Yes When did it last happen?1980s       If all above answers are "NO", may proceed with cephalosporin use.    Golytely  [Peg [id number]-Nacl-Nasulf]     NAUSEA/VOMITING, WEAKNESS    Medications: I have reviewed the patient's current medications. Current Facility-Administered Medications  Medication Dose Route Frequency Provider Last Rate Last Admin   acetaminophen (TYLENOL) tablet 650 mg  650 mg Oral Q6H PRN Adefeso, Oladapo, DO   650 mg at 02/18/22 0801   Or   acetaminophen (TYLENOL) suppository 650 mg  650 mg Rectal Q6H PRN Adefeso, Oladapo, DO       enoxaparin (LOVENOX) injection 40 mg  40 mg Subcutaneous Q24H Adefeso, Oladapo, DO   40 mg at 02/18/22 0801   feeding supplement (BOOST / RESOURCE BREEZE) liquid 1 Container  1 Container Oral TID BM Johnson, Clanford L, MD       ketorolac (TORADOL) 15 MG/ML injection 15  mg  15 mg Intravenous Q6H PRN Virl Cagey, MD       lactated ringers infusion   Intravenous Continuous Virl Cagey, MD 100 mL/hr at 02/18/22 1222 Rate Change at 02/18/22 1222   losartan (COZAAR) tablet 25 mg  25 mg Oral Daily Johnson, Clanford L, MD   25 mg at 02/18/22 1220   melatonin tablet 6 mg  6 mg Oral QHS PRN Adefeso, Oladapo, DO       morphine (PF) 2 MG/ML injection 2 mg  2 mg Intravenous Q3H PRN Virl Cagey, MD       ondansetron (ZOFRAN) tablet 4 mg  4 mg Oral Q6H PRN Adefeso, Oladapo, DO       Or   ondansetron (ZOFRAN) injection 4 mg  4 mg Intravenous Q6H PRN Adefeso, Oladapo, DO   4 mg at 02/16/22 0858   ondansetron (ZOFRAN) injection 4 mg  4 mg Intravenous Q6H Tat, Shanon Brow, MD   4 mg at 02/18/22 1220   oxyCODONE (Oxy IR/ROXICODONE) immediate release tablet 5 mg  5 mg Oral Q4H PRN Virl Cagey, MD       pantoprazole (PROTONIX) injection 40 mg  40 mg Intravenous Therisa Doyne, MD   40 mg at 02/18/22 4982    Results for orders placed or performed during the hospital encounter of 02/15/22 (from the past 48 hour(s))  Resp panel by RT-PCR (RSV, Flu A&B, Covid) Anterior Nasal Swab     Status: None   Collection Time: 02/16/22  3:21 PM   Specimen: Anterior  Nasal Swab  Result Value Ref Range   SARS Coronavirus 2 by RT PCR NEGATIVE NEGATIVE    Comment: (NOTE) SARS-CoV-2 target nucleic acids are NOT DETECTED.  The SARS-CoV-2 RNA is generally detectable in upper respiratory specimens during the acute phase of infection. The lowest concentration of SARS-CoV-2 viral copies this assay can detect is 138 copies/mL. A negative result does not preclude SARS-Cov-2 infection and should not be used as the sole basis for treatment or other patient management decisions. A negative result may occur with  improper specimen collection/handling, submission of specimen other than nasopharyngeal swab, presence of viral mutation(s) within the areas targeted by this assay, and inadequate number of viral copies(<138 copies/mL). A negative result must be combined with clinical observations, patient history, and epidemiological information. The expected result is Negative.  Fact Sheet for Patients:  EntrepreneurPulse.com.au  Fact Sheet for Healthcare Providers:  IncredibleEmployment.be  This test is no t yet approved or cleared by the [place] FDA and  has been authorized for detection and/or diagnosis of SARS-CoV-2 by FDA under an Emergency Use Authorization (EUA). This EUA will remain  in effect (meaning this test can be used) for the duration of the COVID-19 declaration under Section 564(b)(1) of the Act, 21 U.S.C.section 360bbb-3(b)(1), unless the authorization is terminated  or revoked sooner.       Influenza A by PCR NEGATIVE NEGATIVE   Influenza B by PCR NEGATIVE NEGATIVE    Comment: (NOTE) The Xpert Xpress SARS-CoV-2/FLU/RSV plus assay is intended as an aid in the diagnosis of influenza from Nasopharyngeal swab specimens and should not be used as a sole basis for treatment. Nasal washings and aspirates are unacceptable for Xpert Xpress SARS-CoV-2/FLU/RSV testing.  Fact Sheet for  Patients: EntrepreneurPulse.com.au  Fact Sheet for Healthcare Providers: IncredibleEmployment.be  This test is not yet approved or cleared by the [place] FDA and has been authorized for detection and/or diagnosis of SARS-CoV-2 by FDA  under an Emergency Use Authorization (EUA). This EUA will remain in effect (meaning this test can be used) for the duration of the COVID-19 declaration under Section 564(b)(1) of the Act, 21 U.S.C. section 360bbb-3(b)(1), unless the authorization is terminated or revoked.     Resp Syncytial Virus by PCR NEGATIVE NEGATIVE    Comment: (NOTE) Fact Sheet for Patients: EntrepreneurPulse.com.au  Fact Sheet for Healthcare Providers: IncredibleEmployment.be  This test is not yet approved or cleared by the [place] FDA and has been authorized for detection and/or diagnosis of SARS-CoV-2 by FDA under an Emergency Use Authorization (EUA). This EUA will remain in effect (meaning this test can be used) for the duration of the COVID-19 declaration under Section 564(b)(1) of the Act, 21 U.S.C. section 360bbb-3(b)(1), unless the authorization is terminated or revoked.  Performed at [facility], [address] [place], [place] [address]   Respiratory (~20 pathogens) panel by PCR     Status: None   Collection Time: 02/16/22  3:21 PM  Result Value Ref Range   Adenovirus NOT DETECTED NOT DETECTED   Coronavirus 229E NOT DETECTED NOT DETECTED    Comment: (NOTE) The Coronavirus on the Respiratory Panel, DOES NOT test for the novel  Coronavirus (2019 nCoV)    Coronavirus HKU1 NOT DETECTED NOT DETECTED   Coronavirus NL63 NOT DETECTED NOT DETECTED   Coronavirus OC43 NOT DETECTED NOT DETECTED   Metapneumovirus NOT DETECTED NOT DETECTED   Rhinovirus / Enterovirus NOT DETECTED NOT DETECTED   Influenza A NOT DETECTED NOT DETECTED   Influenza B NOT DETECTED NOT DETECTED   Parainfluenza  Virus 1 NOT DETECTED NOT DETECTED   Parainfluenza Virus 2 NOT DETECTED NOT DETECTED   Parainfluenza Virus 3 NOT DETECTED NOT DETECTED   Parainfluenza Virus 4 NOT DETECTED NOT DETECTED   Respiratory Syncytial Virus NOT DETECTED NOT DETECTED   Bordetella pertussis NOT DETECTED NOT DETECTED   Bordetella Parapertussis NOT DETECTED NOT DETECTED   Chlamydophila pneumoniae NOT DETECTED NOT DETECTED   Mycoplasma pneumoniae NOT DETECTED NOT DETECTED    Comment: Performed at [facility], [place] [address] [place], [place] [address]  Comprehensive metabolic panel     Status: None   Collection Time: 02/17/22 10:34 AM  Result Value Ref Range   Sodium 140 135 - 145 mmol/L   Potassium 3.7 3.5 - 5.1 mmol/L   Chloride 105 98 - 111 mmol/L   CO2 23 22 - 32 mmol/L   Glucose, Bld 95 70 - 99 mg/dL    Comment: Glucose reference range applies only to samples taken after fasting for at least 8 hours.   BUN 12 8 - 23 mg/dL   Creatinine, Ser 0.45 0.44 - 1.00 mg/dL   Calcium 9.1 8.9 - 10.3 mg/dL   Total Protein 7.1 6.5 - 8.1 g/dL   Albumin 3.6 3.5 - 5.0 g/dL   AST 20 15 - 41 U/L   ALT 10 0 - 44 U/L   Alkaline Phosphatase 85 38 - 126 U/L   Total Bilirubin 0.8 0.3 - 1.2 mg/dL   GFR, Estimated >60 >60 mL/min    Comment: (NOTE) Calculated using the CKD-EPI Creatinine Equation (2021)    Anion gap 12 5 - 15    Comment: Performed at [facility], [address], [place],  [address]  CBC     Status: None   Collection Time: 02/17/22 10:34 AM  Result Value Ref Range   WBC 10.0 4.0 - 10.5 K/uL   RBC 4.53 3.87 -  5.11 MIL/uL   Hemoglobin 12.9 12.0 - 15.0 g/dL   HCT 42.2 36.0 - 46.0 %   MCV 93.2 80.0 - 100.0 fL   MCH 28.5 26.0 - 34.0 pg   MCHC 30.6 30.0 - 36.0 g/dL   RDW 13.1 11.5 - 15.5 %   Platelets 315 150 - 400 K/uL   nRBC 0.0 0.0 - 0.2 %    Comment: Performed at [facility], [address], [place], [place] [address]    NM Hepato W/EF  Result Date: 02/17/2022 CLINICAL DATA:  Right upper  quadrant pain. Biliary disease suspected. EXAM: NUCLEAR MEDICINE HEPATOBILIARY IMAGING WITH GALLBLADDER EF TECHNIQUE: Sequential images of the abdomen were obtained out to 60 minutes following intravenous administration of radiopharmaceutical. After slow intravenous infusion of 1.43 micrograms Cholecystokinin, gallbladder ejection fraction was determined. RADIOPHARMACEUTICALS:  5.5 mCi Tc-66mCholetec IV COMPARISON:  Abdominal ultrasound 02/16/2022. Abdomen/pelvis CT 02/15/2022 FINDINGS: Prompt uptake and biliary excretion of activity by the liver is seen. Gallbladder activity is visualized, consistent with patency of cystic duct. Biliary activity passes into small bowel, consistent with patent common bile duct. Calculated gallbladder ejection fraction is 14%. (At 60 min, normal ejection fraction is greater than 40%.) IMPRESSION: 1. Normal hepatobiliary patency study. 2. Abnormally decreased gallbladder ejection fraction. Electronically Signed   By: EMisty StanleyM.D.   On: 02/17/2022 10:53    ROS:  Pertinent items are noted in HPI.  Blood pressure (!) 141/76, pulse 97, temperature 98.2 F (36.8 C), resp. rate 18, height '5\' 1"'$  (1.549 m), weight 71.4 kg, SpO2 96 %. Physical Exam:   General: Appears to be sitting up in chair comfortably; no acute distress  Cardiovascular: Regular rate and rhythm; no murmurs, rubs, or gallops; 2+ radial pulses  Pulmonary: Lungs clear to auscultation bilaterally; no wheezes, rales, or rhonchi  Abdomen: Active bowel sounds; soft, non-distended, no organomegaly; Moderate RUQ tenderness; mild epigastric tenderness; negative Murphy's sign; no rebound or guarding; no LLQ tenderness   Assessment/Plan:  Claudia BEGNAUDis an 67y.o. female presenting for 1 day of biliary colic, found to have abnormally decreased gallbladder EF on HIDA scan. Surgery was consulted for surgical management of suspected biliary dyskinesia.    Abdominal pain/biliary dyskinesia  The patient's  history of RUQ pain, nausea and vomiting after eating a fatty meal, and HIDA scan showing decreased EF suggests biliary dyskinesia. Plan for robotic assisted laparoscopic cholecystectomy tomorrow, January 4. NPO at midnight.   Low suspicion for diverticulitis given absence of LLQ pain, fever, and high white count, so would recommend discontinuing the meropenem.    SPaulo Fruit1/04/2022, 8:32 AM

## 2022-02-18 NOTE — Discharge Instructions (Addendum)
Discharge Robotic Assisted Laparoscopic Surgery Instructions:  Common Complaints: Right shoulder pain is common after laparoscopic surgery.  This is secondary to the gas used in the surgery being trapped under the diaphragm.  Walk to help your body absorb the gas. This will improve in a few days. Pain at the port sites are common, especially the larger port sites. This will improve with time.  Some nausea is common and poor appetite. The main goal is to stay hydrated the first few days after surgery.   Diet/ Activity: Diet as tolerated. You may not have an appetite, but it is important to stay hydrated.  Drink 64 ounces of water a day. Your appetite will return with time.  Shower per your regular routine daily.  Do not take hot showers. Take warm showers that are less than 10 minutes. Rest and listen to your body, but do not remain in bed all day.  Walk everyday for at least 15-20 minutes. Deep cough and move around every 1-2 hours in the first few days after surgery.  Do not lift > 10 lbs, perform excessive bending, pushing, pulling, squatting for 1-2 weeks after surgery.  Do not pick at the dermabond glue on your incision sites.  This glue film will remain in place for 1-2 weeks and will start to peel off.  Do not place lotions or balms on your incision unless instructed to specifically by Dr. Constance Haw.   Pain Expectations and Narcotics: -After surgery you will have pain associated with your incisions and this is normal. The pain is muscular and nerve pain, and will get better with time. -You are encouraged and expected to take non narcotic medications like tylenol and ibuprofen (when able) to treat pain as multiple modalities can aid with pain treatment. -Narcotics are only used when pain is severe or there is breakthrough pain. -You are not expected to have a pain score of 0 after surgery, as we cannot prevent pain. A pain score of 3-4 that allows you to be functional, move, walk, and  tolerate some activity is the goal. The pain will continue to improve over the days after surgery and is dependent on your surgery. -Due to [place] law, we are only able to give a certain amount of pain medication to treat post operative pain, and we only give additional narcotics on a patient by patient basis.  -For most laparoscopic surgery, studies have shown that the majority of patients only need 10-15 narcotic pills, and for open surgeries most patients only need 15-20.   -Having appropriate expectations of pain and knowledge of pain management with non narcotics is important as we do not want anyone to become addicted to narcotic pain medication.  -Using ice packs in the first 48 hours and heating pads after 48 hours, wearing an abdominal binder (when recommended), and using over the counter medications are all ways to help with pain management.   -Simple acts like meditation and mindfulness practices after surgery can also help with pain control and research has proven the benefit of these practices.  Medication: Take tylenol and ibuprofen as needed for pain control, alternating every 4-6 hours.  Example:  Tylenol 1072m @ 6am, 12noon, 6pm, 137mnight (Do not exceed 400028mf tylenol a day). Ibuprofen 800m63m9am, 3pm, 9pm, 3am (Do not exceed 3600mg55mibuprofen a day).  Take Roxicodone for breakthrough pain every 4 hours.  Take Colace for constipation related to narcotic pain medication. If you do not have a bowel movement in  2 days, take Miralax over the counter.  Drink plenty of water to also prevent constipation.   Contact Information: If you have questions or concerns, please call our office, [phone], Monday- Thursday 8AM-5PM and Friday 8AM-12Noon.  If it is after hours or on the weekend, please call [facility] Main Number, [phone], [phone], and ask to speak to the surgeon on call for Dr. Constance Haw at [facility].

## 2022-02-18 NOTE — Progress Notes (Signed)
New IV obtained due to the last was leaking. Patient ambulated multiple times to the restroom this shift. No request for pain medications shift. Continued to monitor patient.

## 2022-02-19 ENCOUNTER — Encounter (HOSPITAL_COMMUNITY)
Admission: EM | Disposition: A | Discharge status: Home / Self Care | Payer: Self-pay | Source: Home / Self Care | Attending: Internal Medicine

## 2022-02-19 ENCOUNTER — Other Ambulatory Visit: Payer: Self-pay

## 2022-02-19 ENCOUNTER — Encounter (HOSPITAL_COMMUNITY): Payer: Self-pay | Admitting: Internal Medicine

## 2022-02-19 ENCOUNTER — Inpatient Hospital Stay (HOSPITAL_COMMUNITY): Payer: 59 | Admitting: Certified Registered Nurse Anesthetist

## 2022-02-19 DIAGNOSIS — K828 Other specified diseases of gallbladder: Secondary | ICD-10-CM

## 2022-02-19 LAB — TYPE AND SCREEN
ABO/RH(D): O POS
Antibody Screen: NEGATIVE

## 2022-02-19 SURGERY — CHOLECYSTECTOMY, ROBOT-ASSISTED, LAPAROSCOPIC
Anesthesia: General | Site: Abdomen

## 2022-02-19 MED ORDER — ONDANSETRON HCL 4 MG/2ML IJ SOLN
INTRAMUSCULAR | Status: AC
  Filled 2022-02-19: qty 2

## 2022-02-19 MED ORDER — FENTANYL CITRATE (PF) 250 MCG/5ML IJ SOLN
INTRAMUSCULAR | Status: AC
  Filled 2022-02-19: qty 5

## 2022-02-19 MED ORDER — BUPIVACAINE LIPOSOME 1.3 % IJ SUSP
INTRAMUSCULAR | Status: AC
  Filled 2022-02-19: qty 20

## 2022-02-19 MED ORDER — INDOCYANINE GREEN 25 MG IV SOLR
INTRAVENOUS | Status: AC
  Administered 2022-02-19: 2.5 mg via INTRAVENOUS
  Filled 2022-02-19: qty 10

## 2022-02-19 MED ORDER — PROPOFOL 10 MG/ML IV BOLUS
INTRAVENOUS | Status: DC | PRN
  Administered 2022-02-19: 120 mg via INTRAVENOUS

## 2022-02-19 MED ORDER — LIDOCAINE HCL (PF) 2 % IJ SOLN
INTRAMUSCULAR | Status: AC
  Filled 2022-02-19: qty 5

## 2022-02-19 MED ORDER — EPHEDRINE SULFATE (PRESSORS) 50 MG/ML IJ SOLN
INTRAMUSCULAR | Status: DC | PRN
  Administered 2022-02-19: 10 mg via INTRAVENOUS

## 2022-02-19 MED ORDER — LIDOCAINE HCL (CARDIAC) PF 100 MG/5ML IV SOSY
PREFILLED_SYRINGE | INTRAVENOUS | Status: DC | PRN
  Administered 2022-02-19: 100 mg via INTRAVENOUS

## 2022-02-19 MED ORDER — PROPOFOL 10 MG/ML IV BOLUS
INTRAVENOUS | Status: AC
  Filled 2022-02-19: qty 20

## 2022-02-19 MED ORDER — DEXMEDETOMIDINE HCL IN NACL 200 MCG/50ML IV SOLN
INTRAVENOUS | Status: DC | PRN
  Administered 2022-02-19: 4 ug via INTRAVENOUS
  Administered 2022-02-19: 8 ug via INTRAVENOUS

## 2022-02-19 MED ORDER — KETOROLAC TROMETHAMINE 30 MG/ML IJ SOLN
INTRAMUSCULAR | Status: DC | PRN
  Administered 2022-02-19: 15 mg via INTRAVENOUS

## 2022-02-19 MED ORDER — DEXAMETHASONE SODIUM PHOSPHATE 10 MG/ML IJ SOLN
INTRAMUSCULAR | Status: AC
  Filled 2022-02-19: qty 1

## 2022-02-19 MED ORDER — HYDROMORPHONE HCL 1 MG/ML IJ SOLN: 0.2500 mg | INTRAMUSCULAR | Status: DC | PRN

## 2022-02-19 MED ORDER — HYDROCODONE-ACETAMINOPHEN 7.5-325 MG PO TABS: 1.0000 | ORAL_TABLET | Freq: Once | ORAL | Status: DC | PRN

## 2022-02-19 MED ORDER — LIDOCAINE HCL URETHRAL/MUCOSAL 2 % EX GEL
CUTANEOUS | Status: AC
  Filled 2022-02-19: qty 11

## 2022-02-19 MED ORDER — LACTATED RINGERS IV SOLN: INTRAVENOUS | Status: DC

## 2022-02-19 MED ORDER — STERILE WATER FOR IRRIGATION IR SOLN
Status: DC | PRN
  Administered 2022-02-19: 500 mL

## 2022-02-19 MED ORDER — FENTANYL CITRATE (PF) 100 MCG/2ML IJ SOLN
INTRAMUSCULAR | Status: DC | PRN
  Administered 2022-02-19 (×5): 50 ug via INTRAVENOUS

## 2022-02-19 MED ORDER — ONDANSETRON HCL 4 MG/2ML IJ SOLN
INTRAMUSCULAR | Status: DC | PRN
  Administered 2022-02-19: 4 mg via INTRAVENOUS

## 2022-02-19 MED ORDER — SUGAMMADEX SODIUM 200 MG/2ML IV SOLN
INTRAVENOUS | Status: DC | PRN
  Administered 2022-02-19: 200 mg via INTRAVENOUS

## 2022-02-19 MED ORDER — KETOROLAC TROMETHAMINE 30 MG/ML IJ SOLN
INTRAMUSCULAR | Status: AC
  Filled 2022-02-19: qty 1

## 2022-02-19 MED ORDER — MEPERIDINE HCL 50 MG/ML IJ SOLN: 6.2500 mg | INTRAMUSCULAR | Status: DC | PRN

## 2022-02-19 MED ORDER — ONDANSETRON HCL 4 MG/2ML IJ SOLN: 4.0000 mg | Freq: Once | INTRAMUSCULAR | Status: DC | PRN

## 2022-02-19 MED ORDER — DEXAMETHASONE SODIUM PHOSPHATE 4 MG/ML IJ SOLN
INTRAMUSCULAR | Status: DC | PRN
  Administered 2022-02-19: 5 mg via INTRAVENOUS

## 2022-02-19 MED ORDER — ROCURONIUM BROMIDE 100 MG/10ML IV SOLN
INTRAVENOUS | Status: DC | PRN
  Administered 2022-02-19: 40 mg via INTRAVENOUS

## 2022-02-19 MED ORDER — BUPIVACAINE LIPOSOME 1.3 % IJ SUSP
INTRAMUSCULAR | Status: DC | PRN
  Administered 2022-02-19: 20 mL

## 2022-02-19 MED ORDER — CHLORHEXIDINE GLUCONATE 0.12 % MT SOLN
OROMUCOSAL | Status: AC
  Filled 2022-02-19: qty 15

## 2022-02-19 MED ORDER — DEXMEDETOMIDINE HCL IN NACL 80 MCG/20ML IV SOLN
INTRAVENOUS | Status: AC
  Filled 2022-02-19: qty 20

## 2022-02-19 SURGICAL SUPPLY — 41 items
BLADE SURG 15 STRL LF DISP TIS (BLADE) ×1 IMPLANT
BLADE SURG 15 STRL SS (BLADE) ×1
CHLORAPREP W/TINT 26 (MISCELLANEOUS) ×1 IMPLANT
CLIP LIGATING HEM O LOK PURPLE (MISCELLANEOUS) ×1 IMPLANT
COVER SURGICAL LIGHT HANDLE (MISCELLANEOUS) ×2 IMPLANT
COVER TIP SHEARS 8 DVNC (MISCELLANEOUS) ×1 IMPLANT
COVER TIP SHEARS 8MM DA VINCI (MISCELLANEOUS) ×1
DERMABOND ADVANCED .7 DNX12 (GAUZE/BANDAGES/DRESSINGS) ×1 IMPLANT
DRAPE ARM DVNC X/XI (DISPOSABLE) ×4 IMPLANT
DRAPE COLUMN DVNC XI (DISPOSABLE) ×1 IMPLANT
DRAPE DA VINCI XI ARM (DISPOSABLE) ×4
DRAPE DA VINCI XI COLUMN (DISPOSABLE) ×1
ELECT REM PT RETURN 9FT ADLT (ELECTROSURGICAL) ×1
ELECTRODE REM PT RTRN 9FT ADLT (ELECTROSURGICAL) ×1 IMPLANT
GLOVE BIOGEL PI IND STRL 6.5 (GLOVE) ×2 IMPLANT
GLOVE BIOGEL PI IND STRL 7.0 (GLOVE) ×2 IMPLANT
GLOVE SURG SS PI 6.5 STRL IVOR (GLOVE) IMPLANT
GOWN STRL REUS W/TWL LRG LVL3 (GOWN DISPOSABLE) ×4 IMPLANT
KIT TURNOVER KIT A (KITS) ×1 IMPLANT
MANIFOLD NEPTUNE II (INSTRUMENTS) IMPLANT
NDL HYPO 21X1 ECLIPSE (NEEDLE) ×1 IMPLANT
NDL INSUFFLATION 14GA 120MM (NEEDLE) ×1 IMPLANT
NEEDLE HYPO 21X1 ECLIPSE (NEEDLE) ×1 IMPLANT
NEEDLE INSUFFLATION 14GA 120MM (NEEDLE) ×1 IMPLANT
OBTURATOR OPTICAL STANDARD 8MM (TROCAR) ×1
OBTURATOR OPTICAL STND 8 DVNC (TROCAR) ×1
OBTURATOR OPTICALSTD 8 DVNC (TROCAR) ×1 IMPLANT
PACK LAP CHOLE LZT030E (CUSTOM PROCEDURE TRAY) ×1 IMPLANT
PAD ARMBOARD 7.5X6 YLW CONV (MISCELLANEOUS) ×1 IMPLANT
PENCIL SMOKE EVACUATOR (MISCELLANEOUS) IMPLANT
SEAL CANN UNIV 5-8 DVNC XI (MISCELLANEOUS) ×4 IMPLANT
SEAL XI 5MM-8MM UNIVERSAL (MISCELLANEOUS) ×4
SET BASIN LINEN APH (SET/KITS/TRAYS/PACK) ×1 IMPLANT
SET TUBE SMOKE EVAC HIGH FLOW (TUBING) ×1 IMPLANT
SOL ANTI FOG 6CC (MISCELLANEOUS) IMPLANT
SUT MNCRL AB 4-0 PS2 18 (SUTURE) ×2 IMPLANT
SUT VICRYL 0 AB UR-6 (SUTURE) IMPLANT
SYR 20ML LL LF (SYRINGE) ×1 IMPLANT
SYS RETRIEVAL 5MM INZII UNIV (BASKET) ×1
SYSTEM RETRIEVL 5MM INZII UNIV (BASKET) ×1 IMPLANT
WATER STERILE IRR 500ML POUR (IV SOLUTION) ×1 IMPLANT

## 2022-02-19 NOTE — Progress Notes (Signed)
Returned from PACU after lap chole.  Four abd sites dry and intact with glue.  Alert and oriented. Vitals stable, requesting ice chips and sherbert and c/o a little nausea but not time for zofran.  Walked to bathroom with standby to void.  Denies any dizziness.  Son at bedside.

## 2022-02-19 NOTE — Anesthesia Postprocedure Evaluation (Signed)
Anesthesia Post Note  Patient: Claudia Murphy  Procedure(s) Performed: XI ROBOTIC ASSISTED LAPAROSCOPIC CHOLECYSTECTOMY (Abdomen)  Patient location during evaluation: PACU Anesthesia Type: General Level of consciousness: awake and alert Pain management: pain level controlled Vital Signs Assessment: post-procedure vital signs reviewed and stable Respiratory status: spontaneous breathing, nonlabored ventilation, respiratory function stable and patient connected to nasal cannula oxygen Cardiovascular status: blood pressure returned to baseline and stable Postop Assessment: no apparent nausea or vomiting Anesthetic complications: no   No notable events documented.   Last Vitals:  Vitals:   02/19/22 1708 02/19/22 1715  BP: (!) 146/70 139/67  Pulse: 85 82  Resp: 17 16  Temp: 37 C   SpO2: 92% (!) 89%    Last Pain:  Vitals:   02/19/22 1708  TempSrc:   PainSc: 0-No pain                 Nicanor Alcon

## 2022-02-19 NOTE — Interval H&P Note (Signed)
History and Physical Interval Note:  02/19/2022 3:16 PM  Claudia Murphy  has presented today for surgery, with the diagnosis of biliary dyskinesia; intractable nausea and vomiting.  The various methods of treatment have been discussed with the patient and family. After consideration of risks, benefits and other options for treatment, the patient has consented to  Procedure(s): XI ROBOTIC [place] (N/A) as a surgical intervention.  The patient's history has been reviewed, patient examined, no change in status, stable for surgery.  I have reviewed the patient's chart and labs.  Questions were answered to the patient's satisfaction.    Biliary dyskinesia. Will need two PIV for the OR. S/p left radial mastectomy in 2007. Discussed risk/ benefits and minimal risk of any lymph edema, recommend she get IV on the left side for now, and she can have this pulled post operatively in the PACU. She is in agreement.   Virl Cagey

## 2022-02-19 NOTE — Transfer of Care (Signed)
Immediate Anesthesia Transfer of Care Note  Patient: Claudia Murphy  Procedure(s) Performed: XI ROBOTIC ASSISTED LAPAROSCOPIC CHOLECYSTECTOMY (Abdomen)  Patient Location: PACU  Anesthesia Type:General  Level of Consciousness: awake, alert , and oriented  Airway & Oxygen Therapy: Patient Spontanous Breathing  Post-op Assessment: Report given to RN and Post -op Vital signs reviewed and stable  Post vital signs: Reviewed and stable  Last Vitals:  Vitals Value Taken Time  BP    Temp    Pulse 87 02/19/22 1712  Resp 17 02/19/22 1712  SpO2 92 % 02/19/22 1712  Vitals shown include unvalidated device data.  Last Pain:  Vitals:   02/19/22 1441  TempSrc: Oral  PainSc:       Patients Stated Pain Goal: 3 (74/14/23 9532)  Complications: No notable events documented.

## 2022-02-19 NOTE — Op Note (Signed)
[facility] Operative Note  02/19/22  Preoperative Diagnosis: Biliary dyskinsia, intractable nausea and vomiting.   Postoperative Diagnosis: Same   Procedure(s) Performed: Robotic Assisted Laparoscopic Cholecystectomy   Surgeon: Lanell Matar. Constance Haw, MD   Assistants: No qualified resident was available    Anesthesia: General endotracheal   Anesthesiologist: Dr. Rick Duff   Specimens: Gallbladder   Estimated Blood Loss: Minimal   Blood Replacement: None    Complications: None   Wound Class: Contaminated    Operative Indications: The patient was found to have biliary dyskinesia on imaging and was symptomatic with RUQ pain, nausea and vomiting.  We discussed the risk of the procedure including but not limited to bleeding, infection, injury to the common bile duct, bile leak, need for further procedures, chance of subtotal cholecystectomy.   Findings:  Distended gallbladder Critical view of safety noted All clips intact at the end of the case Adequate hemostasis   Procedure: Firefly was given in the preoperative area. The patient was taken to the operating room and placed supine. General endotracheal anesthesia was induced. Intravenous antibiotics were  administered per protocol.  An orogastric tube positioned to decompress the stomach. The abdomen was prepared and draped in the usual sterile fashion.   Veress needle was placed at the supraumbilical area and insufflation was started after confirming a positive saline drop test and no immediate increase in abdominal pressure.  After reaching 15 mm, the Veress needle was removed and a 8 mm port was placed via optiview technique supraumbilical, measuring 20 mm away from the suspected position of the gallbladder.  The abdomen was inspected and no abnormalities or injuries were found.  Under direct vision, ports were placed in the following locations in a semi curvilinear position around the target of the gallbladder: Two 8  mm ports on the patient's right each having 8cm clearance to the adjacent ports and one 8 mm port placed on the patient's left 8 cm from the umbilical port. Once ports were placed, the table was placed in the reverse Trendelenburg position with the right side up. The Xi platform was brought into the operative field and docked to the ports successfully.  An endoscope was placed through the umbilical port, fenestrated grasper through the most lateral right port, prograsp to the port just right of the umbilicus, and then a hook cautery in the left port.   The dome of the gallbladder was grasped with prograsp and retracted over the dome of the liver. Adhesions between the gallbladder and omentum, duodenum and transverse colon were lysed via hook cautery. The infundibulum was grasped with the fenestrated grasper and retracted toward the right lower quadrant. This maneuver exposed Calot's triangle. Firefly was used throughout the dissection to ensure safe visualization of the cystic duct. The peritoneum overlying the gallbladder infundibulum was then dissected and the cystic duct and cystic artery identified.  Critical view of safety with the liver bed clearly visible behind the duct and artery with no additional structures noted.  The cystic duct and cystic artery were doubly clipped and divided close to the gallbladder.  The gallbladder was placed in a 82m Endo Catch bag through the left sided port.  The gallbladder was then dissected from its peritoneal and liver bed attachments by electrocautery. Some bile was spilled. Hemostasis was checked prior to removing the hook cautery.  The XMechele Claudewas undocked and moved out of the field. Suction was used evacuated the bile.  The gallbladder was passed off the table as a specimen. There  was no evidence of bleeding from the gallbladder fossa or cystic artery or leakage of the bile from the cystic duct stump.  A 24m Endo Catch bag was then pulled through the umbilical port and  the gallbladder was removed.  The abdomen was desufflated and secondary trocars were removed under direct vision. The umbilical port site closed with a 0 vicryl.  No bleeding was noted. All skin incisions were closed with subcuticular sutures of 4-0 monocryl and dermabond.    Final inspection revealed acceptable hemostasis. All counts were correct at the end of the case. The patient was awakened from anesthesia and extubated without complication. The OG tube was removed.  The patient went to the PACU in stable condition.   LCurlene Labrum MD [facility] [phone](office)

## 2022-02-19 NOTE — Anesthesia Preprocedure Evaluation (Signed)
Anesthesia Evaluation  Patient identified by MRN, date of birth, ID band Patient awake    Reviewed: Allergy & Precautions, NPO status , Patient's Chart, lab work & pertinent test results  History of Anesthesia Complications (+) PONV and history of anesthetic complications  Airway Mallampati: II  TM Distance: >3 FB Neck ROM: Full    Dental no notable dental hx. (+) Teeth Intact   Pulmonary pneumonia, resolved, former smoker States 1978   Pulmonary exam normal breath sounds clear to auscultation       Cardiovascular Exercise Tolerance: Good hypertension, Pt. on medications negative cardio ROS Normal cardiovascular examI Rhythm:Regular Rate:Normal     Neuro/Psych   Anxiety      Neuromuscular disease  negative psych ROS   GI/Hepatic Neg liver ROS,neg GERD  ,,Denies any GERD or meds at this time    Endo/Other  negative endocrine ROS    Renal/GU negative Renal ROS  negative genitourinary   Musculoskeletal  (+) Arthritis , Osteoarthritis,  States has back issues   Abdominal   Peds negative pediatric ROS (+)  Hematology negative hematology ROS (+)   Anesthesia Other Findings H/o breast L  CA - s/p mastectomy -last CT 2007  Reproductive/Obstetrics negative OB ROS                              Anesthesia Physical Anesthesia Plan  ASA: 3  Anesthesia Plan: General   Post-op Pain Management: Dilaudid IV   Induction: Intravenous  PONV Risk Score and Plan: Treatment may vary due to age or medical condition, Propofol infusion, TIVA, Midazolam and Ondansetron  Airway Management Planned: Oral ETT  Additional Equipment:   Intra-op Plan:   Post-operative Plan: Extubation in OR  Informed Consent: I have reviewed the patients History and Physical, chart, labs and discussed the procedure including the risks, benefits and alternatives for the proposed anesthesia with the patient or authorized  representative who has indicated his/her understanding and acceptance.     Dental advisory given  Plan Discussed with: CRNA  Anesthesia Plan Comments:          Anesthesia Quick Evaluation

## 2022-02-19 NOTE — Progress Notes (Signed)
[facility]  Updated family. Can remove PIV from the left arm in post op area as long as doing well. Diet tonight. PRN for pain.  Hopefully home tomorrow.  [name], MD [facility] [address] [place], [place] [address] [phone] (office)

## 2022-02-19 NOTE — Progress Notes (Signed)
Patient slept on and off this shift. She complained of some back pain at the beginning of the shift. Tylenol was given and and Ice pack per the patients request. Patient ambulated to restroom multiple times. She was also NPO at midnight and CHG done for procedure this AM.

## 2022-02-20 MED ORDER — ONDANSETRON HCL 4 MG PO TABS: 4.0000 mg | ORAL_TABLET | Freq: Four times a day (QID) | ORAL | 0 refills | Status: DC | PRN

## 2022-02-20 MED ORDER — OXYCODONE HCL 5 MG PO TABS: 5.0000 mg | ORAL_TABLET | ORAL | 0 refills | Status: DC | PRN

## 2022-02-20 NOTE — Discharge Summary (Signed)
Physician Discharge Summary  Patient ID: Claudia Murphy MRN: [id number] DOB/AGE: 1955-11-22 67 y.o.  Admit date: 02/15/2022 Discharge date: 02/20/2022  Admission Diagnoses: RUQ pain, intractable nausea and vomiting   Discharge Diagnoses:  Active Problems:   Essential hypertension   GERD without esophagitis   Intractable vomiting   Biliary dyskinesia   Discharged Condition: good  Hospital Course: Claudia Murphy is a 67 yo who had RUQ pain, nausea and vomiting and was admitted to the hospital 12/31 with pain that was not controlled and inability to take in oral intake. She was worked up by the hospitalist and Ct scan demonstrated concern for possible diverticulitis and some biliary duct dilation. She had a [place] and ultimately a HIDA scan that demonstrated biliary dyskinesia. Her presenting symptoms were RUQ pain and nausea and her intake was poor. She was empirically started on antibiotics for the diverticulitis but had no symptoms of this when I saw her and her CT showed diverticula with some underdistended colon in my opinion. The antibiotics were stopped.   She was kept in the hospital given her nausea and vomiting and underwent a robotic assisted laparoscopic cholecystectomy 02/19/2021. She was kept overnight to ensure her intake improved and she could hydrate at home. She is doing well this AM.   Consults:  Hospitalist admission taken over by surgery   Significant Diagnostic Studies: CT with diverticula and some thickened colon, biliary dilation, [place] with CBD 68m and no stones, HIDA with biliary dyskinesia   Treatments: IV hydration, antibiotics: Meropenem empirically for possible diverticulitis, but stopped after 2 days, and robotic assisted laparoscopic cholecystectomy  Discharge Exam: Blood pressure 117/61, pulse 80, temperature 98.8 F (37.1 C), temperature source Oral, resp. rate 20, height '5\' 1"'$  (1.549 m), weight 71.4 kg, SpO2 95 %. General appearance: alert and no distress Resp: normal  work of breathing GI: soft, nondistended, appropriately tender port sites c/d/I with dermabond, no erythema or drainage   Disposition: Discharge disposition: 01-Home or Self Care       Discharge Instructions     Call MD for:  difficulty breathing, headache or visual disturbances   Complete by: As directed    Call MD for:  extreme fatigue   Complete by: As directed    Call MD for:  persistant dizziness or light-headedness   Complete by: As directed    Call MD for:  persistant nausea and vomiting   Complete by: As directed    Call MD for:  redness, tenderness, or signs of infection (pain, swelling, redness, odor or green/yellow discharge around incision site)   Complete by: As directed    Call MD for:  severe uncontrolled pain   Complete by: As directed    Call MD for:  temperature >100.4   Complete by: As directed    Increase activity slowly   Complete by: As directed       Allergies as of 02/20/2022       Reactions   Epirubicin Other (See Comments)   Skin toxicity "hands started peeling and turning real red" (01/25/2012)   Latex Other (See Comments)   Blisters   Methocarbamol Shortness Of Breath   Penicillins Rash, Other (See Comments)   "turned real real red; took 1 pill and ended up in hospital for 2 wks; in the 1980's" (01/25/2012) Did it involve swelling of the face/tongue/throat, SOB, or low BP? No Did it involve sudden or severe rash/hives, skin peeling, or any reaction on the inside of your mouth or nose? Yes  Did you need to seek medical attention at a hospital or doctor's office? Yes When did it last happen?1980s       If all above answers are "NO", may proceed with cephalosporin use. Tolerated Meropenem and Invanz 02/2022   Golytely [peg 3350-[id number]-nacl-nasulf]    NAUSEA/VOMITING, WEAKNESS        Medication List     TAKE these medications    aspirin EC 81 MG tablet Take 81 mg by mouth daily.   calcium carbonate 1250 (500 Ca) MG tablet Commonly  known as: OS-CAL - dosed in mg of elemental calcium Take 2 tablets by mouth 2 (two) times daily.   diclofenac 75 MG EC tablet Commonly known as: VOLTAREN Take 1 tablet (75 mg total) by mouth 2 (two) times daily.   losartan 25 MG tablet Commonly known as: COZAAR TAKE 1 TABLET BY MOUTH EVERY DAY   ondansetron 4 MG tablet Commonly known as: ZOFRAN Take 1 tablet (4 mg total) by mouth every 6 (six) hours as needed for nausea.   oxyCODONE 5 MG immediate release tablet Commonly known as: Oxy IR/ROXICODONE Take 1 tablet (5 mg total) by mouth every 4 (four) hours as needed for severe pain or breakthrough pain.   pantoprazole 40 MG tablet Commonly known as: PROTONIX Take 1 tablet (40 mg total) by mouth daily. 30 minutes before breakfast        Follow-up Information     Virl Cagey, MD Follow up on 03/05/2022.   Specialty: General Surgery Why: post op call; if you need to be seen in person call the office Contact information: [address] [place] [facility] [address] [phone]                 Signed: Virl Cagey 02/20/2022, 10:23 AM

## 2022-02-20 NOTE — Progress Notes (Signed)
Patient slept most of the night during this shift. Patient did complain of soreness in the abdomen at 0530 am, I offered to given pain medication and patient declined. Patient tolerated diet well, no nausea or vomiting. Abdominal incision are all clean and dry with no drainage. Most recent vital signs are T 98.8 P 74 RR 20 B/P 117/61 O2 Sat 95% on RA.

## 2022-02-23 LAB — SURGICAL PATHOLOGY

## 2022-02-25 ENCOUNTER — Telehealth: Payer: Self-pay | Admitting: Family Medicine

## 2022-02-25 NOTE — Telephone Encounter (Signed)
FMLA paperwork completed and faxed to [employer] at [phone] and received confirmation.  Patient is out of work from 02/15/2022 and may return unrestricted on 03/09/2022.

## 2022-03-05 ENCOUNTER — Ambulatory Visit (INDEPENDENT_AMBULATORY_CARE_PROVIDER_SITE_OTHER): Payer: 59 | Admitting: General Surgery

## 2022-03-05 DIAGNOSIS — K828 Other specified diseases of gallbladder: Secondary | ICD-10-CM

## 2022-03-05 NOTE — Progress Notes (Signed)
[facility]  I am calling the patient for post operative evaluation. This is not a billable encounter as it is under the [place] charges for the surgery.  The patient had a robotic assisted laparoscopic cholecystectomy on 02/19/2022. The patient reports that she is doing well. The are tolerating a diet, having good pain control, and having regular Bms.  The incisions are healing but are bruised. She is mildly sore at the bruising. The patient has no concerns.   Pathology:  A. GALLBLADDER, CHOLECYSTECTOMY:  Chronic cholecystitis   Will see the patient PRN.  Diet and activity.  She will return to work on 03/09/22.   [name], MD [facility] [address] [place], [place] [address] [phone] (office)

## 2022-03-06 ENCOUNTER — Encounter: Payer: Self-pay | Admitting: *Deleted

## 2022-03-06 ENCOUNTER — Telehealth: Payer: Self-pay | Admitting: *Deleted

## 2022-03-06 NOTE — Telephone Encounter (Signed)
Surgical Date: 02/19/2022 Procedure: XI Robotic Assisted Lap Cholecystectomy  Patient came by office today to pick up return to work note.   Patient also inquired about discoloration to R abdominal incision. Noted yellowing to purplish crescent shaped discoloration above incision. Noted 1.5 cm hard knot to the left of the incision under the most purple of discoloration with light palpation. Patient reports intermittent throbbing to area. Reports that throbbing feels like "twinge" in her abdomen.   Advised that hard area is most likely a hematoma under the skin that is being reabsorbed. Advised that discoloration is also resolving as expected. No other discoloration noted to abdomen at other surgical sites/ incisions.   Surgical glue remains on all incision sites. No other complaints or concerns noted.   Advised if discoloration/ throbbing worsens or does not improve, contact office for further recommendations.   Advised if abdominal pain occurs when she returns to work to contact office. Of note, patient frequently lifts >25 lbs above her head for her job.   Dr. Constance Haw to be made aware.

## 2022-10-27 ENCOUNTER — Other Ambulatory Visit (HOSPITAL_COMMUNITY): Payer: Self-pay | Admitting: Adult Health

## 2022-10-27 DIAGNOSIS — Z1231 Encounter for screening mammogram for malignant neoplasm of breast: Secondary | ICD-10-CM

## 2022-11-02 ENCOUNTER — Encounter (HOSPITAL_COMMUNITY): Payer: Self-pay

## 2022-11-02 ENCOUNTER — Ambulatory Visit (HOSPITAL_COMMUNITY)
Admission: RE | Admit: 2022-11-02 | Discharge: 2022-11-02 | Disposition: A | Discharge status: Home / Self Care | Payer: 59 | Source: Ambulatory Visit | Attending: Adult Health | Admitting: Adult Health

## 2022-11-02 DIAGNOSIS — Z1231 Encounter for screening mammogram for malignant neoplasm of breast: Secondary | ICD-10-CM | POA: Diagnosis present

## 2022-11-07 ENCOUNTER — Other Ambulatory Visit: Payer: Self-pay | Admitting: Gastroenterology

## 2022-11-11 NOTE — Telephone Encounter (Signed)
Phoned the pt and LMOVM for the pt to call or contact me by MyChart

## 2023-04-29 ENCOUNTER — Inpatient Hospital Stay: Admitting: Oncology

## 2023-04-29 ENCOUNTER — Inpatient Hospital Stay

## 2023-05-13 ENCOUNTER — Inpatient Hospital Stay: Attending: Oncology | Admitting: Oncology

## 2023-05-13 ENCOUNTER — Inpatient Hospital Stay

## 2023-05-13 ENCOUNTER — Encounter: Payer: Self-pay | Admitting: Oncology

## 2023-05-13 ENCOUNTER — Other Ambulatory Visit: Payer: Self-pay | Admitting: *Deleted

## 2023-05-13 VITALS — BP 153/89 | HR 102 | Temp 97.3°F | Resp 19 | Ht 61.0 in | Wt 164.9 lb

## 2023-05-13 DIAGNOSIS — D72829 Elevated white blood cell count, unspecified: Secondary | ICD-10-CM

## 2023-05-13 DIAGNOSIS — Z9010 Acquired absence of unspecified breast and nipple: Secondary | ICD-10-CM | POA: Insufficient documentation

## 2023-05-13 DIAGNOSIS — Z87891 Personal history of nicotine dependence: Secondary | ICD-10-CM | POA: Insufficient documentation

## 2023-05-13 DIAGNOSIS — Z853 Personal history of malignant neoplasm of breast: Secondary | ICD-10-CM | POA: Diagnosis not present

## 2023-05-13 DIAGNOSIS — K5730 Diverticulosis of large intestine without perforation or abscess without bleeding: Secondary | ICD-10-CM | POA: Insufficient documentation

## 2023-05-13 DIAGNOSIS — Z9221 Personal history of antineoplastic chemotherapy: Secondary | ICD-10-CM | POA: Insufficient documentation

## 2023-05-13 DIAGNOSIS — C50919 Malignant neoplasm of unspecified site of unspecified female breast: Secondary | ICD-10-CM

## 2023-05-13 LAB — CBC WITH DIFFERENTIAL/PLATELET
Abs Immature Granulocytes: 0.04 10*3/uL (ref 0.00–0.07)
Basophils Absolute: 0.1 10*3/uL (ref 0.0–0.1)
Basophils Relative: 1 %
Eosinophils Absolute: 0.5 10*3/uL (ref 0.0–0.5)
Eosinophils Relative: 4 %
HCT: 38.6 % (ref 36.0–46.0)
Hemoglobin: 12.2 g/dL (ref 12.0–15.0)
Immature Granulocytes: 0 %
Lymphocytes Relative: 24 %
Lymphs Abs: 2.9 10*3/uL (ref 0.7–4.0)
MCH: 28 pg (ref 26.0–34.0)
MCHC: 31.6 g/dL (ref 30.0–36.0)
MCV: 88.5 fL (ref 80.0–100.0)
Monocytes Absolute: 0.7 10*3/uL (ref 0.1–1.0)
Monocytes Relative: 6 %
Neutro Abs: 7.8 10*3/uL — ABNORMAL HIGH (ref 1.7–7.7)
Neutrophils Relative %: 65 %
Platelets: 438 10*3/uL — ABNORMAL HIGH (ref 150–400)
RBC: 4.36 MIL/uL (ref 3.87–5.11)
RDW: 13.1 % (ref 11.5–15.5)
WBC: 12 10*3/uL — ABNORMAL HIGH (ref 4.0–10.5)
nRBC: 0 % (ref 0.0–0.2)

## 2023-05-13 LAB — SEDIMENTATION RATE: Sed Rate: 32 mm/h — ABNORMAL HIGH (ref 0–22)

## 2023-05-13 LAB — COMPREHENSIVE METABOLIC PANEL WITH GFR
ALT: 19 U/L (ref 0–44)
AST: 20 U/L (ref 15–41)
Albumin: 3.6 g/dL (ref 3.5–5.0)
Alkaline Phosphatase: 117 U/L (ref 38–126)
Anion gap: 11 (ref 5–15)
BUN: 16 mg/dL (ref 8–23)
CO2: 27 mmol/L (ref 22–32)
Calcium: 9.4 mg/dL (ref 8.9–10.3)
Chloride: 100 mmol/L (ref 98–111)
Creatinine, Ser: 0.56 mg/dL (ref 0.44–1.00)
GFR, Estimated: >60 mL/min (ref >60)
Glucose, Bld: 120 mg/dL — ABNORMAL HIGH (ref 70–99)
Potassium: 3.6 mmol/L (ref 3.5–5.1)
Sodium: 138 mmol/L (ref 135–145)
Total Bilirubin: 0.6 mg/dL (ref 0.0–1.2)
Total Protein: 7.6 g/dL (ref 6.5–8.1)

## 2023-05-13 LAB — C-REACTIVE PROTEIN: CRP: 1.5 mg/dL — ABNORMAL HIGH (ref <1.0)

## 2023-05-13 LAB — URIC ACID: Uric Acid, Serum: 4.8 mg/dL (ref 2.5–7.1)

## 2023-05-13 LAB — LACTATE DEHYDROGENASE: LDH: 107 U/L (ref 98–192)

## 2023-05-13 LAB — IRON AND TIBC
Iron: 54 ug/dL (ref 28–170)
Saturation Ratios: 13 % (ref 10.4–31.8)
TIBC: 413 ug/dL (ref 250–450)
UIBC: 359 ug/dL

## 2023-05-13 LAB — FERRITIN: Ferritin: 26 ng/mL (ref 11–307)

## 2023-05-13 NOTE — Progress Notes (Unsigned)
 [facility] 2 HEMATOLOGY NEW VISIT  [name], MD  REASON FOR REFERRAL: Leukocytosis  HISTORY OF PRESENT ILLNESS: Claudia Murphy 68 y.o. female referred for leukocytosis. Accompanied by    I have reviewed the past medical history, past surgical history, social history and family history with the patient   ALLERGIES:  is allergic to epirubicin, latex, methocarbamol, penicillins, and peg 3350-kcl-nabcb-nacl-nasulf.  MEDICATIONS:  Current Outpatient Medications  Medication Sig Dispense Refill   aspirin EC 81 MG tablet Take 81 mg by mouth daily.     calcium carbonate (OS-CAL - DOSED IN MG OF ELEMENTAL CALCIUM) 1250 (500 Ca) MG tablet Take 2 tablets by mouth 2 (two) times daily.      celecoxib (CELEBREX) 200 MG capsule Take 200 mg by mouth daily.     losartan (COZAAR) 25 MG tablet TAKE 1 TABLET BY MOUTH EVERY DAY 30 tablet PRN   No current facility-administered medications for this visit.     REVIEW OF SYSTEMS:   Constitutional: Denies fevers, chills or night sweats Eyes: Denies blurriness of vision Ears, nose, mouth, throat, and face: Denies mucositis or sore throat Respiratory: Denies cough, dyspnea or wheezes Cardiovascular: Denies palpitation, chest discomfort or lower extremity swelling Gastrointestinal:  Denies nausea, heartburn or change in bowel habits Skin: Denies abnormal skin rashes Lymphatics: Denies new lymphadenopathy or easy bruising Neurological:Denies numbness, tingling or new weaknesses Behavioral/Psych: Mood is stable, no new changes  All other systems were reviewed with the patient and are negative.  PHYSICAL EXAMINATION:   Vitals:   05/13/23 1323  BP: (!) 153/89  Pulse: (!) 102  Resp: 19  Temp: (!) 97.3 F (36.3 C)  SpO2: 100%    GENERAL:alert, no distress and comfortable LYMPH:  no palpable lymphadenopathy in the cervical, axillary or inguinal LUNGS: clear to auscultation and percussion with normal  breathing effort HEART: regular rate & rhythm and no murmurs and no lower extremity edema ABDOMEN:abdomen soft, non-tender and normal bowel sounds Musculoskeletal:no cyanosis of digits and no clubbing  NEURO: alert & oriented x 3 with fluent speech  LABORATORY DATA:  I have reviewed the data as listed in labs from primary care obtained on 04/21/2023 CBC: WBC: 12.9, hemoglobin: 12.2, hematocrit: 38.7, MCV: 88, platelets: 432, ANC: 8.0, ALC: 3.3 CMP: Creatinine: 0.53, BUN: 16, potassium: 4.1, calcium: 9.7, albumin: 4.1 ALP: 135, AST: 18, ALT: 14  CBC from 04/10/2023: WBC: 12.5, hemoglobin: 12.4, MCV: 89, platelets: 438  CBC from 01/23/2023: WBC: 12.2, hemoglobin: 13.2, MCV: 90, platelets: 453  Lab Results  Component Value Date   WBC 10.0 02/17/2022   NEUTROABS 8.8 (H) 02/15/2022   HGB 12.9 02/17/2022   HCT 42.2 02/17/2022   MCV 93.2 02/17/2022   PLT 315 02/17/2022      Component Value Date/Time   NA 140 02/17/2022 1034   K 3.7 02/17/2022 1034   CL 105 02/17/2022 1034   CO2 23 02/17/2022 1034   GLUCOSE 95 02/17/2022 1034   BUN 12 02/17/2022 1034   CREATININE 0.45 02/17/2022 1034   CREATININE 0.61 09/28/2012 1155   CALCIUM 9.1 02/17/2022 1034   PROT 7.1 02/17/2022 1034   ALBUMIN 3.6 02/17/2022 1034   AST 20 02/17/2022 1034   ALT 10 02/17/2022 1034   ALKPHOS 85 02/17/2022 1034   BILITOT 0.8 02/17/2022 1034   GFRNONAA >60 02/17/2022 1034   GFRAA >60 10/14/2018 0816    No results found for: "SPEP", "UPEP"     Chemistry  Component Value Date/Time   NA 140 02/17/2022 1034   K 3.7 02/17/2022 1034   CL 105 02/17/2022 1034   CO2 23 02/17/2022 1034   BUN 12 02/17/2022 1034   CREATININE 0.45 02/17/2022 1034   CREATININE 0.61 09/28/2012 1155      Component Value Date/Time   CALCIUM 9.1 02/17/2022 1034   ALKPHOS 85 02/17/2022 1034   AST 20 02/17/2022 1034   ALT 10 02/17/2022 1034   BILITOT 0.8 02/17/2022 1034       RADIOGRAPHIC STUDIES: I have personally  reviewed the radiological images as listed and agreed with the findings in the report. No results found.  ASSESSMENT & PLAN:  No problem-specific Assessment & Plan notes found for this encounter.   No orders of the defined types were placed in this encounter.   The total time spent in the appointment was {CHL ONC TIME VISIT - ZOXWR:[id number]} encounter with patients including review of chart and various tests results, discussions about plan of care and coordination of care plan   All questions were answered. The patient knows to call the clinic with any problems, questions or concerns. No barriers to learning was detected.   Cindie Crumbly, MD 3/27/20251:36 PM

## 2023-05-13 NOTE — Patient Instructions (Signed)
 VISIT SUMMARY:  During today's visit, we discussed your elevated white blood cell counts, abdominal pain, and other health concerns. We reviewed your recent CT scan results and your history of breast cancer and chemotherapy. We also talked about your ongoing issues with arthritis and degenerative disc disease.  YOUR PLAN:  -LEUKOCYTOSIS: Leukocytosis means having a higher than normal white blood cell count, which can be due to inflammation, infection, stress, or arthritis. We will conduct blood tests to check for inflammatory markers and screen for leukemia. We will review the results in two weeks to determine the cause of the elevated counts.  -DIVERTICULOSIS: Diverticulosis is a condition where small pouches form in the colon, which can become inflamed and cause pain. We will monitor your symptoms and inflammatory markers, and no medication is needed unless your symptoms worsen.  -BREAST CANCER: Your current symptoms and elevated white blood cell counts do not suggest a recurrence of breast cancer. We will review your chemotherapy history for any long-term effects, but the likelihood of recurrence is low.  -BLADDER WALL THICKENING: Bladder wall thickening was observed in your recent CT scan. You have a referral to see a urologist for further evaluation on May 13.  INSTRUCTIONS:  Please follow up in two weeks for a review of your blood test results. Attend your urology appointment on May 13 for further evaluation of the bladder wall thickening.

## 2023-05-14 NOTE — Assessment & Plan Note (Signed)
 Patient has a history of triple negative breast cancer diagnosed in 2007 s/p chemotherapy with 6 cycles of FEC and mastectomy.  Was on breast cancer survivorship for 10 years.  Currently getting yearly mammograms. -No evidence of recurrence of disease at this time.

## 2023-05-14 NOTE — Assessment & Plan Note (Addendum)
 Mild leukocytosis with WBC ~12 for 3 months. Possible causes: inflammation, infection, stress, arthritis. No significant B symptoms. History of breast cancer and chemotherapy; current presentation not suggestive of recurrence or leukemia. CLL considered but unlikely.  Also likely secondary to diverticulosis. - Order blood tests for inflammatory markers and and flow cytometry to rule out lymphoma/leukemia   Return to clinic in 2 weeks to discuss results and further management

## 2023-05-18 LAB — SURGICAL PATHOLOGY

## 2023-05-21 LAB — FLOW CYTOMETRY

## 2023-05-31 ENCOUNTER — Inpatient Hospital Stay: Attending: Oncology | Admitting: Oncology

## 2023-05-31 VITALS — BP 144/91 | HR 88 | Temp 97.1°F | Resp 18 | Ht 61.0 in | Wt 164.8 lb

## 2023-05-31 DIAGNOSIS — D75839 Thrombocytosis, unspecified: Secondary | ICD-10-CM | POA: Diagnosis not present

## 2023-05-31 DIAGNOSIS — D72829 Elevated white blood cell count, unspecified: Secondary | ICD-10-CM | POA: Insufficient documentation

## 2023-05-31 DIAGNOSIS — D509 Iron deficiency anemia, unspecified: Secondary | ICD-10-CM | POA: Diagnosis present

## 2023-05-31 DIAGNOSIS — C50919 Malignant neoplasm of unspecified site of unspecified female breast: Secondary | ICD-10-CM | POA: Diagnosis not present

## 2023-05-31 DIAGNOSIS — E611 Iron deficiency: Secondary | ICD-10-CM | POA: Diagnosis not present

## 2023-05-31 DIAGNOSIS — Z171 Estrogen receptor negative status [ER-]: Secondary | ICD-10-CM

## 2023-05-31 MED ORDER — FERROUS SULFATE 325 (65 FE) MG PO TBEC: 325.0000 mg | DELAYED_RELEASE_TABLET | ORAL | 3 refills | Status: AC

## 2023-05-31 NOTE — Assessment & Plan Note (Signed)
 Mild leukocytosis with WBC ~12 for 3 months.  Likely secondary to chronic inflammation or diverticulosis No significant B symptoms.  ESR, CRP: Elevated Flow cytometry: Negative for leukemia/lymphoma  - Continue to blood counts

## 2023-05-31 NOTE — Assessment & Plan Note (Signed)
 Likely secondary to iron deficiency and probably component of chronic inflammation as well Last colonoscopy/endoscopy: 10/2018.  Recommended follow-up in 10 years   -We discussed some of the risks, benefits, and alternatives of intravenous iron infusions. The patient is symptomatic from anemia and the iron level is critically low. She tolerated oral iron supplement poorly and desires to achieve higher levels of iron faster for adequate hematopoesis. Some of the side-effects to be expected including risks of infusion reactions, phlebitis, headaches, nausea and fatigue.  The patient is willing to proceed. Patient education material was dispensed. Goal is to keep ferritin level greater than 50 and resolution of anemia -Start oral iron every other day.  Use MiraLAX for constipation  Return to clinic in 6 weeks with labs to assess response to IV iron

## 2023-05-31 NOTE — Progress Notes (Signed)
 [facility]  HEMATOLOGY FOLLOW-UP VISIT  Claudia Nevins, MD  REASON FOR FOLLOW-UP: Leukocytosis  ASSESSMENT & PLAN:  Patient is a 68 y.o. female following for leukocytosis  Breast cancer Patient has a history of triple negative breast cancer diagnosed in 2007 s/p chemotherapy with 6 cycles of FEC and mastectomy.   Was on breast cancer survivorship for 10 years.   Currently getting yearly mammograms.  -No evidence of recurrence of disease at this time.  Leukocytosis Mild leukocytosis with WBC ~12 for 3 months.  Likely secondary to chronic inflammation or diverticulosis No significant B symptoms.  ESR, CRP: Elevated Flow cytometry: Negative for leukemia/lymphoma  - Continue to blood counts  Thrombocytosis Likely secondary to iron deficiency and probably component of chronic inflammation as well Last colonoscopy/endoscopy: 10/2018.  Recommended follow-up in 10 years   -We discussed some of the risks, benefits, and alternatives of intravenous iron infusions. The patient is symptomatic from anemia and the iron level is critically low. She tolerated oral iron supplement poorly and desires to achieve higher levels of iron faster for adequate hematopoesis. Some of the side-effects to be expected including risks of infusion reactions, phlebitis, headaches, nausea and fatigue.  The patient is willing to proceed. Patient education material was dispensed. Goal is to keep ferritin level greater than 50 and resolution of anemia -Start oral iron every other day.  Use MiraLAX for constipation  Return to clinic in 6 weeks with labs to assess response to IV iron   Orders Placed This Encounter  Procedures   Ferritin    Standing Status:   Future    Expected Date:   07/12/2023    Expiration Date:   05/30/2024   CBC with Differential/Platelet    Standing Status:   Future    Expected Date:   07/12/2023    Expiration Date:   05/30/2024   Comprehensive  metabolic panel with GFR    Standing Status:   Future    Expected Date:   07/12/2023    Expiration Date:   05/30/2024   Iron and TIBC    Standing Status:   Future    Expected Date:   07/12/2023    Expiration Date:   05/30/2024    The total time spent in the appointment was 20 minutes encounter with patients including review of chart and various tests results, discussions about plan of care and coordination of care plan   All questions were answered. The patient knows to call the clinic with any problems, questions or concerns. No barriers to learning was detected.  Cindie Crumbly, MD 4/14/20252:51 PM   SUMMARY OF HEMATOLOGIC/ONCOLOGY HISTORY: Leukocytosis: Likely secondary to diverticulosis -ESR,CRP: elevated -LDH: Normal -Flow cytometry: Negative  Breast cancer: - Diagnosed 2006 -S/P chemotherapy with 5-fluorouracil, epirubicin and cyclophosphamide for 6 cycles in a dose dense fashion but had methotrexate as a substitute for epirubicin for the 6th cycle.   -She had a mastectomy and finished all of chemotherapy as of 06/03/2005.   -Since then patient has been on surveillance and was discharged after the 10-year mark.   Thrombocytosis:Likely secondary to Iron deficiency and inflammation   INTERVAL HISTORY: Claudia Murphy 68 y.o. female following for leukocytosis.  Patient reports occasional fatigue.No recent fevers, chills, weight loss, or loss of appetite. She occasionally feels full, leading to skipped meals, especially on busy weekends.  She is concerned about her abnormal lab results including elevated inflammatory markers and high white blood cell count.  We  discussed that she might have some chronic inflammation contributing to both of this and would not require any specific treatment at the moment.  Patient quit smoking in 2006, does not drink alcohol. Has no family history of cancer.   I have reviewed the past medical history, past surgical history, social history and family  history with the patient   ALLERGIES:  is allergic to epirubicin, latex, methocarbamol, penicillins, and peg 3350-kcl-nabcb-nacl-nasulf.  MEDICATIONS:  Current Outpatient Medications  Medication Sig Dispense Refill   aspirin EC 81 MG tablet Take 81 mg by mouth daily.     calcium carbonate (OS-CAL - DOSED IN MG OF ELEMENTAL CALCIUM) 1250 (500 Ca) MG tablet Take 2 tablets by mouth 2 (two) times daily.      celecoxib (CELEBREX) 200 MG capsule Take 200 mg by mouth daily.     ferrous sulfate 325 (65 FE) MG EC tablet Take 1 tablet (325 mg total) by mouth every other day. 45 tablet 3   losartan (COZAAR) 25 MG tablet TAKE 1 TABLET BY MOUTH EVERY DAY 30 tablet PRN   No current facility-administered medications for this visit.     REVIEW OF SYSTEMS:   Constitutional: Denies fevers, chills or night sweats Eyes: Denies blurriness of vision Ears, nose, mouth, throat, and face: Denies mucositis or sore throat Respiratory: Denies cough, dyspnea or wheezes Cardiovascular: Denies palpitation, chest discomfort or lower extremity swelling Gastrointestinal:  Denies nausea, heartburn or change in bowel habits Skin: Denies abnormal skin rashes Lymphatics: Denies new lymphadenopathy or easy bruising Neurological:Denies numbness, tingling or new weaknesses Behavioral/Psych: Mood is stable, no new changes  All other systems were reviewed with the patient and are negative.  PHYSICAL EXAMINATION:   Vitals:   05/31/23 1405  BP: (!) 144/91  Pulse: 88  Resp: 18  Temp: (!) 97.1 F (36.2 C)  SpO2: 100%    GENERAL:alert, no distress and comfortable SKIN: skin color, texture, turgor are normal, no rashes or significant lesions LYMPH:  no palpable lymphadenopathy in the cervical, axillary or inguinal LUNGS: clear to auscultation and percussion with normal breathing effort HEART: regular rate & rhythm and no murmurs and no lower extremity edema ABDOMEN:abdomen soft, non-tender and normal bowel  sounds Musculoskeletal:no cyanosis of digits and no clubbing  NEURO: alert & oriented x 3 with fluent speech  LABORATORY DATA:  I have reviewed the data as listed  Lab Results  Component Value Date   WBC 12.0 (H) 05/13/2023   NEUTROABS 7.8 (H) 05/13/2023   HGB 12.2 05/13/2023   HCT 38.6 05/13/2023   MCV 88.5 05/13/2023   PLT 438 (H) 05/13/2023       Chemistry      Component Value Date/Time   NA 138 05/13/2023 1358   K 3.6 05/13/2023 1358   CL 100 05/13/2023 1358   CO2 27 05/13/2023 1358   BUN 16 05/13/2023 1358   CREATININE 0.56 05/13/2023 1358   CREATININE 0.61 09/28/2012 1155      Component Value Date/Time   CALCIUM 9.4 05/13/2023 1358   ALKPHOS 117 05/13/2023 1358   AST 20 05/13/2023 1358   ALT 19 05/13/2023 1358   BILITOT 0.6 05/13/2023 1358      Latest Reference Range & Units 05/13/23 13:58  Sed Rate 0 - 22 mm/hr 32 (H)  (H): Data is abnormally high   Latest Reference Range & Units 05/13/23 13:58 05/13/23 15:03  Iron 28 - 170 ug/dL  54  UIBC ug/dL  213  TIBC 086 - 578 ug/dL  413  Saturation Ratios 10.4 - 31.8 %  13  Ferritin 11 - 307 ng/mL  26  CRP <1.0 mg/dL 1.5 (H)   (H): Data is abnormally high   Latest Reference Range & Units 05/13/23 13:58  LDH 98 - 192 U/L 107   Flow cytometry: 05/13/23  Peripheral blood, flow cytometry:  -  No immunophenotypic evidence of a lymphoproliferative disorder (i.e.  no monoclonal B cells or immunophenotypically abnormal T cells  detected).   GATING AND PHENOTYPIC ANALYSIS:   Gated population: Flow cytometric immunophenotyping is performed using  antibodies to the antigens listed in the table below. Electronic gates  are placed around a cell cluster displaying light scatter properties  corresponding to: lymphocytes   Abnormal Cells in gated population: N/A   Phenotype of Abnormal Cells: N/A    Colonoscopy: 10/2018: Impression:  - Diverticulosis in the recto- sigmoid colon, in the sigmoid colon and in the  descending colon. - External and internal hemorrhoids.  - Tortuous colon.

## 2023-05-31 NOTE — Assessment & Plan Note (Signed)
 Patient has a history of triple negative breast cancer diagnosed in 2007 s/p chemotherapy with 6 cycles of FEC and mastectomy.  Was on breast cancer survivorship for 10 years.  Currently getting yearly mammograms. -No evidence of recurrence of disease at this time.

## 2023-06-04 ENCOUNTER — Inpatient Hospital Stay

## 2023-06-04 VITALS — BP 120/72 | HR 75 | Temp 97.6°F | Resp 18

## 2023-06-04 DIAGNOSIS — D509 Iron deficiency anemia, unspecified: Secondary | ICD-10-CM | POA: Diagnosis not present

## 2023-06-04 DIAGNOSIS — E611 Iron deficiency: Secondary | ICD-10-CM

## 2023-06-04 MED ORDER — IRON SUCROSE 500 MG IVPB - SIMPLE MED
500.0000 mg | Freq: Once | INTRAVENOUS | Status: AC
  Administered 2023-06-04: 500 mg via INTRAVENOUS
  Filled 2023-06-04: qty 500

## 2023-06-04 MED ORDER — ACETAMINOPHEN 325 MG PO TABS
650.0000 mg | ORAL_TABLET | Freq: Once | ORAL | Status: AC
  Administered 2023-06-04: 650 mg via ORAL
  Filled 2023-06-04: qty 2

## 2023-06-04 MED ORDER — CETIRIZINE HCL 10 MG PO TABS
10.0000 mg | ORAL_TABLET | Freq: Once | ORAL | Status: AC
  Administered 2023-06-04: 10 mg via ORAL
  Filled 2023-06-04: qty 1

## 2023-06-04 MED ORDER — SODIUM CHLORIDE 0.9 % IV SOLN: INTRAVENOUS | Status: DC

## 2023-06-04 NOTE — Patient Instructions (Signed)

## 2023-06-04 NOTE — Progress Notes (Signed)
 Patient tolerated iron  infusion with no complaints voiced.  Peripheral IV site clean and dry with good blood return noted before and after infusion Gauze and coban wrap applied. Pt observed for 30 minutes post venofer  infusion without any complications. RN educated pt on the importance of notifying the clinic and when to seek emergency care, pt verbalized understanding. VSS with discharge and left in satisfactory condition with no s/s of distress noted. All follow ups as scheduled.   Claudia Murphy [employer]

## 2023-06-11 ENCOUNTER — Inpatient Hospital Stay

## 2023-06-16 ENCOUNTER — Telehealth: Payer: Self-pay

## 2023-06-16 NOTE — Telephone Encounter (Signed)
 Notified the pt regarding her FMLA forms. I wanted to know when she would like her FMLA to start. I also informed her that her forms were completed, faxed with confirmation received. Pt copy to be mailed as requested.

## 2023-06-16 NOTE — Telephone Encounter (Signed)
 Notified the pt regarding her FMLA forms. Left a call back number.

## 2023-06-22 ENCOUNTER — Inpatient Hospital Stay: Attending: Oncology

## 2023-06-22 ENCOUNTER — Encounter: Payer: Self-pay | Admitting: *Deleted

## 2023-06-22 VITALS — BP 118/69 | HR 78 | Temp 97.2°F | Resp 18

## 2023-06-22 DIAGNOSIS — D75839 Thrombocytosis, unspecified: Secondary | ICD-10-CM | POA: Diagnosis not present

## 2023-06-22 DIAGNOSIS — Z853 Personal history of malignant neoplasm of breast: Secondary | ICD-10-CM | POA: Diagnosis not present

## 2023-06-22 DIAGNOSIS — E611 Iron deficiency: Secondary | ICD-10-CM | POA: Insufficient documentation

## 2023-06-22 DIAGNOSIS — D72829 Elevated white blood cell count, unspecified: Secondary | ICD-10-CM | POA: Diagnosis present

## 2023-06-22 DIAGNOSIS — R7982 Elevated C-reactive protein (CRP): Secondary | ICD-10-CM | POA: Diagnosis not present

## 2023-06-22 MED ORDER — CETIRIZINE HCL 10 MG PO TABS
10.0000 mg | ORAL_TABLET | Freq: Once | ORAL | Status: AC
  Administered 2023-06-22: 10 mg via ORAL
  Filled 2023-06-22: qty 1

## 2023-06-22 MED ORDER — IRON SUCROSE 500 MG IVPB - SIMPLE MED
500.0000 mg | Freq: Once | INTRAVENOUS | Status: AC
  Administered 2023-06-22: 500 mg via INTRAVENOUS
  Filled 2023-06-22: qty 500

## 2023-06-22 MED ORDER — ACETAMINOPHEN 325 MG PO TABS
650.0000 mg | ORAL_TABLET | Freq: Once | ORAL | Status: AC
  Administered 2023-06-22: 650 mg via ORAL
  Filled 2023-06-22: qty 2

## 2023-06-22 MED ORDER — SODIUM CHLORIDE 0.9 % IV SOLN: INTRAVENOUS | Status: DC

## 2023-06-22 NOTE — Progress Notes (Signed)
 Patient presents today for Venofer infusion per providers order.  Vital signs WNL.  Patient has no new complaints at this time.  Peripheral IV started and blood return noted pre and post infusion.  Stable during infusion without adverse affects.  Vital signs stable.  No complaints at this time.  Discharge from clinic ambulatory in stable condition.  Alert and oriented X 3.  Follow up with [facility] as scheduled.

## 2023-06-22 NOTE — Patient Instructions (Signed)

## 2023-06-28 ENCOUNTER — Telehealth: Payer: Self-pay | Admitting: Urology

## 2023-06-28 NOTE — Telephone Encounter (Signed)
 Left message on vm for pt to obtain the CT images from [facility]

## 2023-06-28 NOTE — Progress Notes (Incomplete)
 Chief Complaint: No chief complaint on file.   History of Present Illness:  Claudia Murphy is a 68 y.o. female who is seen in consultation from Kathyleen Parkins, MD for evaluation of ***.   Past Medical History:  Past Medical History:  Diagnosis Date   Anxiety    Arthritis    "all over my body" (01/25/2012)   Breast cancer (HCC)    "left" (01/25/2012)   Breast disorder    breast left   Carpal tunnel syndrome on both sides    Cataract mature, total senile    "both" (01/25/2012)   Cataracts, bilateral    Chronic lower back pain    DJD (degenerative joint disease)    "neck, shoulders, back" (01/25/2012)   Family history of breast cancer    Family history of stomach cancer    ?niece? 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 Repeat screening colonoscopy in 5-10 years.   COLONOSCOPY WITH PROPOFOL  N/A 10/18/2018   diverticulosis, torturous colon, hemorrhoids.   FOOT SURGERY     INJECTION KNEE  2013   lt   KNEE ARTHROSCOPY  1980's   "left" (01/25/2012)   MASTECTOMY  02/2005   left   PARTIAL KNEE ARTHROPLASTY  01/25/2012   Procedure: UNICOMPARTMENTAL KNEE;  Surgeon: Genevie Kerns, MD;  Location: [facility] OR;  Service: Orthopedics;  Laterality: Left;  LEFT KNEE  UNICOMPARTMENTAL ARTHROPLASTY    PERIPHERALLY INSERTED CENTRAL CATHETER INSERTION  2007   PLANTAR FASCIA SURGERY  11/25/2010   "left" (01/25/2012)   REPLACEMENT UNICONDYLAR JOINT KNEE  01/25/2012   "left" (01/25/2012)   THROMBECTOMY / EMBOLECTOMY SUBCLAVIAN ARTERY  2007   "post PICC line removal" (01/25/2012)   TUBAL LIGATION  1970's    Allergies:  Allergies  Allergen Reactions   Epirubicin Other (See Comments) and Itching    Skin toxicity  "hands started peeling and turning real red" (01/25/2012)  Skin toxicity, "hands started peeling and turning real red" (01/25/2012)   Latex Other (See Comments)    Blisters   Methocarbamol Shortness Of Breath   Penicillins Other (See Comments), Rash and Itching    "turned real real red; took 1 pill and ended up in hospital for 2 wks; in the 1980's" (01/25/2012)  Did it involve swelling of the face/tongue/throat, SOB, or low BP? No  Did it involve sudden or severe rash/hives, skin peeling, or any reaction on the inside of your mouth or nose? Yes  Did you need to seek medical attention at a hospital or doctor's office? Yes  When did it last happen?1980s        If all above answers are "NO", may proceed with cephalosporin use.  Tolerated Meropenem  and  Invanz  02/2022  "turned real real red; took 1 pill and ended up in hospital for 2 wks; in the 1980's" (01/25/2012), Did it involve swelling of the face/tongue/throat, SOB, or low BP? No, Did it involve sudden or severe rash/hives, skin peeling, or any reaction on the inside of your mouth or nose? Yes, Did you need to seek medical attention at a hospital or doctor's office? Yes, When did it last happen?1980s      , If all above answers are "NO", may proceed with cephalosporin use.   Peg 3350 -Kcl-Nabcb-Nacl-Nasulf     NAUSEA/VOMITING, WEAKNESS  Other Reaction(s): GI Intolerance    Family History:  Family History  Problem Relation Age of Onset   Diabetes Mother    Hypertension Mother    Heart  disease Father    Hypertension Father    Diabetes Son    Diabetes Sister    Thyroid disease Sister    Cancer Sister        lung and brain   Diabetes Sister    Diabetes Sister    Breast cancer Sister 73   Diabetes Sister    COPD Brother    Hypertension Brother    Breast cancer Maternal Aunt 48   Stomach cancer Maternal Uncle    Cancer Other 35       possible uterine cancer   Colon cancer Neg Hx     Social History:  Social History   Tobacco Use   Smoking status: Former    Current packs/day: 0.00    Average packs/day: 1.5 packs/day for 33.0 years (49.5 ttl pk-yrs)    Types: Cigarettes    Start date: 11/17/1971    Quit date: 11/16/2004    Years since quitting: 18.6   Smokeless tobacco: Never  Vaping Use   Vaping status: Never Used  Substance Use Topics   Alcohol use: No   Drug use: No    Review of symptoms:  Constitutional:  Negative for unexplained weight loss, night sweats, fever, chills ENT:  Negative for nose bleeds, sinus pain, painful swallowing CV:  Negative for chest pain, shortness of breath, exercise intolerance, palpitations, loss of consciousness Resp:  Negative for cough, wheezing, shortness of breath GI:  Negative for nausea, vomiting, diarrhea, bloody stools GU:  Positives noted in HPI; otherwise negative for gross hematuria, dysuria, urinary incontinence Neuro:  Negative for seizures, poor balance, limb weakness, slurred speech Psych:  Negative for lack of energy, depression, anxiety Endocrine:  Negative for polydipsia, polyuria, symptoms of hypoglycemia (dizziness, hunger, sweating) Hematologic:  Negative for anemia, purpura, petechia, prolonged or excessive bleeding, use of anticoagulants  Allergic:  Negative for difficulty breathing or choking as a result of exposure to anything; no shellfish allergy; no allergic response (rash/itch) to materials, foods  Physical exam: There were no vitals taken for this visit. GENERAL APPEARANCE:  Well appearing,  well developed, well nourished, NAD HEENT: Atraumatic, Normocephalic, oropharynx clear. NECK: Supple without lymphadenopathy or thyromegaly. LUNGS: Clear to auscultation bilaterally. HEART: Regular Rate and Rhythm without murmurs, gallops, or rubs. ABDOMEN: Soft, non-tender, No Masses. EXTREMITIES: Moves all extremities well.  Without clubbing, cyanosis, or edema. NEUROLOGIC:  Alert and oriented x 3, normal gait, CN II-XII grossly intact.  MENTAL STATUS:  Appropriate. BACK:  Non-tender to palpation.  No CVAT SKIN:  Warm, dry and intact.    Results: No results found for this or any previous visit (from the past 24 hours).    I have reviewed referring/prior physicians records-33 pages reviewed  I  have reviewed urinalysis  I have reviewed prior urine cultures  I reviewed prior imaging studies--[facility] CT result reviewed.  Prior CT abdomen and pelvis available images reviewed  Assessment: No diagnosis found.   Plan: ***

## 2023-06-28 NOTE — Telephone Encounter (Signed)
-----   Message from Claudia Murphy sent at 06/28/2023 12:30 PM EDT ----- Patient has appointment tomorrow.  She has had CT scan done at [facility].  See if she can obtain disc of CT images to bring in with her.

## 2023-06-29 ENCOUNTER — Ambulatory Visit: Admitting: Urology

## 2023-06-29 DIAGNOSIS — R9341 Abnormal radiologic findings on diagnostic imaging of renal pelvis, ureter, or bladder: Secondary | ICD-10-CM

## 2023-07-07 ENCOUNTER — Inpatient Hospital Stay

## 2023-07-07 DIAGNOSIS — E611 Iron deficiency: Secondary | ICD-10-CM | POA: Diagnosis not present

## 2023-07-07 LAB — FERRITIN: Ferritin: 274 ng/mL (ref 11–307)

## 2023-07-07 LAB — CBC WITH DIFFERENTIAL/PLATELET
Abs Immature Granulocytes: 0.03 10*3/uL (ref 0.00–0.07)
Basophils Absolute: 0.1 10*3/uL (ref 0.0–0.1)
Basophils Relative: 1 %
Eosinophils Absolute: 0.4 10*3/uL (ref 0.0–0.5)
Eosinophils Relative: 4 %
HCT: 39.3 % (ref 36.0–46.0)
Hemoglobin: 12.4 g/dL (ref 12.0–15.0)
Immature Granulocytes: 0 %
Lymphocytes Relative: 30 %
Lymphs Abs: 3.1 10*3/uL (ref 0.7–4.0)
MCH: 28.1 pg (ref 26.0–34.0)
MCHC: 31.6 g/dL (ref 30.0–36.0)
MCV: 88.9 fL (ref 80.0–100.0)
Monocytes Absolute: 0.8 10*3/uL (ref 0.1–1.0)
Monocytes Relative: 7 %
Neutro Abs: 5.8 10*3/uL (ref 1.7–7.7)
Neutrophils Relative %: 58 %
Platelets: 344 10*3/uL (ref 150–400)
RBC: 4.42 MIL/uL (ref 3.87–5.11)
RDW: 13.8 % (ref 11.5–15.5)
WBC: 10.3 10*3/uL (ref 4.0–10.5)
nRBC: 0 % (ref 0.0–0.2)

## 2023-07-07 LAB — IRON AND TIBC
Iron: 57 ug/dL (ref 28–170)
Saturation Ratios: 19 % (ref 10.4–31.8)
TIBC: 298 ug/dL (ref 250–450)
UIBC: 241 ug/dL

## 2023-07-07 LAB — COMPREHENSIVE METABOLIC PANEL WITH GFR
ALT: 22 U/L (ref 0–44)
AST: 26 U/L (ref 15–41)
Albumin: 3.7 g/dL (ref 3.5–5.0)
Alkaline Phosphatase: 107 U/L (ref 38–126)
Anion gap: 7 (ref 5–15)
BUN: 21 mg/dL (ref 8–23)
CO2: 27 mmol/L (ref 22–32)
Calcium: 9 mg/dL (ref 8.9–10.3)
Chloride: 100 mmol/L (ref 98–111)
Creatinine, Ser: 0.64 mg/dL (ref 0.44–1.00)
GFR, Estimated: >60 mL/min (ref >60)
Glucose, Bld: 90 mg/dL (ref 70–99)
Potassium: 3.7 mmol/L (ref 3.5–5.1)
Sodium: 134 mmol/L — ABNORMAL LOW (ref 135–145)
Total Bilirubin: 0.4 mg/dL (ref 0.0–1.2)
Total Protein: 7.2 g/dL (ref 6.5–8.1)

## 2023-07-14 ENCOUNTER — Inpatient Hospital Stay: Admitting: Oncology

## 2023-07-14 VITALS — BP 129/71 | HR 70 | Temp 96.4°F | Resp 16 | Wt 166.2 lb

## 2023-07-14 DIAGNOSIS — D72829 Elevated white blood cell count, unspecified: Secondary | ICD-10-CM

## 2023-07-14 DIAGNOSIS — D75839 Thrombocytosis, unspecified: Secondary | ICD-10-CM | POA: Diagnosis not present

## 2023-07-14 DIAGNOSIS — E611 Iron deficiency: Secondary | ICD-10-CM

## 2023-07-14 DIAGNOSIS — C50919 Malignant neoplasm of unspecified site of unspecified female breast: Secondary | ICD-10-CM

## 2023-07-14 DIAGNOSIS — Z171 Estrogen receptor negative status [ER-]: Secondary | ICD-10-CM

## 2023-07-14 NOTE — Assessment & Plan Note (Signed)
 Mild leukocytosis with WBC ~12 for 3 months.  Likely secondary to chronic inflammation or diverticulosis.  Resolved at this time No significant B symptoms.  ESR, CRP: Elevated Flow cytometry: Negative for leukemia/lymphoma  - Continue to monitor blood counts

## 2023-07-14 NOTE — Assessment & Plan Note (Signed)
 Likely secondary to iron  deficiency.  Resolved at this time.

## 2023-07-14 NOTE — Assessment & Plan Note (Signed)
 Patient has a history of triple negative breast cancer diagnosed in 2007 s/p chemotherapy with 6 cycles of FEC and mastectomy.  Was on breast cancer survivorship for 10 years.  Currently getting yearly mammograms. -No evidence of recurrence of disease at this time.

## 2023-07-14 NOTE — Assessment & Plan Note (Signed)
 Likely secondary to chronic blood loss and probably component of chronic inflammation as well Last colonoscopy/endoscopy: 10/2018.  Recommended follow-up in 10 years S/p IV iron  with significant improvement  -Continue oral iron  every other day.  Use MiraLAX for constipation  Return to clinic in 3 months with labs

## 2023-07-14 NOTE — Progress Notes (Unsigned)
 [facility] at [facility]  HEMATOLOGY FOLLOW-UP VISIT  Claudia Parkins, MD  REASON FOR FOLLOW-UP: Leukocytosis  ASSESSMENT & PLAN:  Patient is a 68 y.o. female following for leukocytosis  Breast cancer Patient has a history of triple negative breast cancer diagnosed in 2007 s/p chemotherapy with 6 cycles of FEC and mastectomy.   Was on breast cancer survivorship for 10 years.   Currently getting yearly mammograms.  -No evidence of recurrence of disease at this time.  Leukocytosis Mild leukocytosis with WBC ~12 for 3 months.  Likely secondary to chronic inflammation or diverticulosis.  Resolved at this time No significant B symptoms.  ESR, CRP: Elevated Flow cytometry: Negative for leukemia/lymphoma  - Continue to monitor blood counts  Thrombocytosis Likely secondary to iron  deficiency.  Resolved at this time.   Iron  deficiency Likely secondary to chronic blood loss and probably component of chronic inflammation as well Last colonoscopy/endoscopy: 10/2018.  Recommended follow-up in 10 years S/p IV iron  with significant improvement  -Continue oral iron  every other day.  Use MiraLAX for constipation  Return to clinic in 3 months with labs   Orders Placed This Encounter  Procedures   Ferritin    Standing Status:   Future    Expected Date:   10/11/2023    Expiration Date:   07/13/2024   Folate    Standing Status:   Future    Expected Date:   10/11/2023    Expiration Date:   07/13/2024   Vitamin B12    Standing Status:   Future    Expected Date:   10/11/2023    Expiration Date:   07/13/2024   CBC with Differential/Platelet    Standing Status:   Future    Expected Date:   10/11/2023    Expiration Date:   07/13/2024   Comprehensive metabolic panel with GFR    Standing Status:   Future    Expected Date:   10/11/2023    Expiration Date:   07/13/2024   Iron  and TIBC    Standing Status:   Future    Expected Date:   10/11/2023    Expiration Date:    07/13/2024    The total time spent in the appointment was 20 minutes encounter with patients including review of chart and various tests results, discussions about plan of care and coordination of care plan   All questions were answered. The patient knows to call the clinic with any problems, questions or concerns. No barriers to learning was detected.  Eduardo Grade, MD 5/28/20252:31 PM   SUMMARY OF HEMATOLOGIC/ONCOLOGY HISTORY: Leukocytosis: Likely secondary to diverticulosis -ESR,CRP: elevated -LDH: Normal -Flow cytometry: Negative - Resolved at this time  Breast cancer: - Diagnosed 2006 -S/P chemotherapy with 5-fluorouracil, epirubicin and cyclophosphamide for 6 cycles in a dose dense fashion but had methotrexate as a substitute for epirubicin for the 6th cycle.   -She had a mastectomy and finished all of chemotherapy as of 06/03/2005.   -Since then patient has been on surveillance and was discharged after the 10-year mark.   Thrombocytosis:Likely secondary to Iron  deficiency and inflammation  -S/p IV Venofer  500 mg X2 doses on 06/04/2023 06/22/2023  - Resolved at this time   INTERVAL HISTORY: Claudia Murphy 68 y.o. female following for leukocytosis.  Patient reports occasional fatigue.No recent fevers, chills, weight loss, or loss of appetite. She occasionally feels full, leading to skipped meals, especially on busy weekends.  She is concerned about her abnormal lab results including  elevated inflammatory markers and high white blood cell count.  We discussed that she might have some chronic inflammation contributing to both of this and would not require any specific treatment at the moment.  Patient quit smoking in 2006, does not drink alcohol. Has no family history of cancer.   I have reviewed the past medical history, past surgical history, social history and family history with the patient   ALLERGIES:  is allergic to epirubicin, latex, methocarbamol, penicillins, and peg  3350-kcl-nabcb-nacl-nasulf.  MEDICATIONS:  Current Outpatient Medications  Medication Sig Dispense Refill   aspirin EC 81 MG tablet Take 81 mg by mouth daily.     calcium carbonate (OS-CAL - DOSED IN MG OF ELEMENTAL CALCIUM) 1250 (500 Ca) MG tablet Take 2 tablets by mouth 2 (two) times daily.      celecoxib (CELEBREX) 200 MG capsule Take 200 mg by mouth daily.     ferrous sulfate  325 (65 FE) MG EC tablet Take 1 tablet (325 mg total) by mouth every other day. 45 tablet 3   losartan  (COZAAR ) 25 MG tablet TAKE 1 TABLET BY MOUTH EVERY DAY 30 tablet PRN   No current facility-administered medications for this visit.     REVIEW OF SYSTEMS:   Constitutional: Denies fevers, chills or night sweats Eyes: Denies blurriness of vision Ears, nose, mouth, throat, and face: Denies mucositis or sore throat Respiratory: Denies cough, dyspnea or wheezes Cardiovascular: Denies palpitation, chest discomfort or lower extremity swelling Gastrointestinal:  Denies nausea, heartburn or change in bowel habits Skin: Denies abnormal skin rashes Lymphatics: Denies new lymphadenopathy or easy bruising Neurological:Denies numbness, tingling or new weaknesses Behavioral/Psych: Mood is stable, no new changes  All other systems were reviewed with the patient and are negative.  PHYSICAL EXAMINATION:   Vitals:   07/14/23 1408  BP: 129/71  Pulse: 70  Resp: 16  Temp: (!) 96.4 F (35.8 C)  SpO2: 100%     GENERAL:alert, no distress and comfortable SKIN: skin color, texture, turgor are normal, no rashes or significant lesions LYMPH:  no palpable lymphadenopathy in the cervical, axillary or inguinal LUNGS: clear to auscultation and percussion with normal breathing effort HEART: regular rate & rhythm and no murmurs and no lower extremity edema ABDOMEN:abdomen soft, non-tender and normal bowel sounds Musculoskeletal:no cyanosis of digits and no clubbing  NEURO: alert & oriented x 3 with fluent speech  LABORATORY  DATA:  I have reviewed the data as listed  Lab Results  Component Value Date   WBC 10.3 07/07/2023   NEUTROABS 5.8 07/07/2023   HGB 12.4 07/07/2023   HCT 39.3 07/07/2023   MCV 88.9 07/07/2023   PLT 344 07/07/2023       Chemistry      Component Value Date/Time   NA 134 (L) 07/07/2023 1506   K 3.7 07/07/2023 1506   CL 100 07/07/2023 1506   CO2 27 07/07/2023 1506   BUN 21 07/07/2023 1506   CREATININE 0.64 07/07/2023 1506   CREATININE 0.61 09/28/2012 1155      Component Value Date/Time   CALCIUM 9.0 07/07/2023 1506   ALKPHOS 107 07/07/2023 1506   AST 26 07/07/2023 1506   ALT 22 07/07/2023 1506   BILITOT 0.4 07/07/2023 1506      Latest Reference Range & Units 05/13/23 13:58  Sed Rate 0 - 22 mm/hr 32 (H)  (H): Data is abnormally high  Latest Reference Range & Units 05/13/23 13:58  CRP <1.0 mg/dL 1.5 (H)  (H): Data is abnormally high  Latest Reference Range & Units 07/07/23 15:05  Iron  28 - 170 ug/dL 57  UIBC ug/dL 161  TIBC 096 - 045 ug/dL 409  Saturation Ratios 10.4 - 31.8 % 19  Ferritin 11 - 307 ng/mL 274     Latest Reference Range & Units 05/13/23 13:58  LDH 98 - 192 U/L 107   Flow cytometry: 05/13/23  Peripheral blood, flow cytometry:  -  No immunophenotypic evidence of a lymphoproliferative disorder (i.e.  no monoclonal B cells or immunophenotypically abnormal T cells  detected).   GATING AND PHENOTYPIC ANALYSIS:   Gated population: Flow cytometric immunophenotyping is performed using  antibodies to the antigens listed in the table below. Electronic gates  are placed around a cell cluster displaying light scatter properties  corresponding to: lymphocytes   Abnormal Cells in gated population: N/A   Phenotype of Abnormal Cells: N/A    Colonoscopy: 10/2018: Impression:  - Diverticulosis in the recto- sigmoid colon, in the sigmoid colon and in the descending colon. - External and internal hemorrhoids.  - Tortuous colon.

## 2023-07-16 ENCOUNTER — Encounter: Payer: Self-pay | Admitting: Oncology

## 2023-08-11 ENCOUNTER — Ambulatory Visit: Admitting: Urology

## 2023-08-11 VITALS — BP 148/92 | HR 123

## 2023-08-11 DIAGNOSIS — N3289 Other specified disorders of bladder: Secondary | ICD-10-CM | POA: Diagnosis not present

## 2023-08-11 MED ORDER — CIPROFLOXACIN HCL 500 MG PO TABS
500.0000 mg | ORAL_TABLET | Freq: Once | ORAL | Status: AC
  Administered 2023-08-11: 500 mg via ORAL

## 2023-08-11 NOTE — Progress Notes (Signed)
 08/11/2023 1:59 PM   Erminio CHRISTELLA Novak 1955/09/24 [id number]  Referring provider: Marvine Rush, MD [address] [place],  [place] [address]  Bladder mass   HPI: Ms Hubert is a [phone] here for evaluation of bladder wall thickening. She has a 20pk year smoking hx. She has been working in a cigarette factory for 50 years. She has a hx of breast cancer treated in 2007. She has urinary frequency and urgency which are not bothersome. No dysuria or hematuria. She underwent CT 05/07/2023 which showed posterior bladder wall thickening.    PMH: Past Medical History:  Diagnosis Date   Anxiety    Arthritis    all over my body (01/25/2012)   Breast cancer (HCC)    left (01/25/2012)   Breast disorder    breast left   Carpal tunnel syndrome on both sides    Cataract mature, total senile    both (01/25/2012)   Cataracts, bilateral    Chronic lower back pain    DJD (degenerative joint disease)    neck, shoulders, back (01/25/2012)   Family history of breast cancer    Family history of stomach cancer    ?niece? 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 Repeat screening colonoscopy in 5-10 years.   COLONOSCOPY WITH PROPOFOL  N/A 10/18/2018   diverticulosis, torturous colon,  hemorrhoids.   FOOT SURGERY     INJECTION KNEE  2013   lt   KNEE ARTHROSCOPY  1980's   left (01/25/2012)   MASTECTOMY  02/2005   left   PARTIAL KNEE ARTHROPLASTY  01/25/2012   Procedure: UNICOMPARTMENTAL KNEE;  Surgeon: Lamar DELENA Millman, MD;  Location: [facility] OR;  Service: Orthopedics;  Laterality: Left;  LEFT KNEE UNICOMPARTMENTAL ARTHROPLASTY    PERIPHERALLY INSERTED CENTRAL CATHETER INSERTION  2007   PLANTAR FASCIA SURGERY  11/25/2010   left (01/25/2012)   REPLACEMENT UNICONDYLAR JOINT KNEE  01/25/2012   left (01/25/2012)   THROMBECTOMY / EMBOLECTOMY SUBCLAVIAN ARTERY  2007   post PICC line removal (01/25/2012)   TUBAL LIGATION  1970's    Home Medications:  Allergies as of 08/11/2023       Reactions   Epirubicin Other (See Comments), Itching   Skin toxicity hands started peeling and turning real red (01/25/2012) Skin toxicity, hands started peeling and turning real red (01/25/2012)   Latex Other (See Comments)   Blisters   Methocarbamol Shortness Of Breath   Penicillins Other (See Comments), Rash, Itching   turned real real red; took 1 pill and ended up in hospital for 2 wks; in the 1980's (01/25/2012) Did it involve swelling of the face/tongue/throat, SOB, or low BP? No Did it involve sudden or severe rash/hives, skin peeling, or any  reaction on the inside of your mouth or nose? Yes Did you need to seek medical attention at a hospital or doctor's office? Yes When did it last happen?1980s       If all above answers are NO, may proceed with cephalosporin use. Tolerated Meropenem  and Invanz  02/2022 turned real real red; took 1 pill and ended up in hospital for 2 wks; in the 1980's (01/25/2012), Did it involve swelling of the face/tongue/throat, SOB, or low BP? No, Did it involve sudden or severe rash/hives, skin peeling, or any reaction on the inside of your mouth or nose? Yes, Did you need to seek medical attention at a hospital or doctor's office? Yes, When did it last  happen?1980s      , If all above answers are "NO", may proceed with cephalosporin use.   Peg 3350 -kcl-nabcb-nacl-nasulf    NAUSEA/VOMITING, WEAKNESS Other Reaction(s): GI Intolerance        Medication List        Accurate as of August 11, 2023  1:59 PM. If you have any questions, ask your nurse or doctor.          aspirin EC 81 MG tablet Take 81 mg by mouth daily.   calcium carbonate 1250 (500 Ca) MG tablet Commonly known as: OS-CAL - dosed in mg of elemental calcium Take 2 tablets by mouth 2 (two) times daily.   celecoxib 200 MG capsule Commonly known as: CELEBREX Take 200 mg by mouth daily.   cyclobenzaprine 10 MG tablet Commonly known as: FLEXERIL Take 10 mg by mouth 3 (three) times daily.   ferrous sulfate  325 (65 FE) MG EC tablet Take 1 tablet (325 mg total) by mouth every other day.   losartan  25 MG tablet Commonly known as: COZAAR  TAKE 1 TABLET BY MOUTH EVERY DAY   predniSONE 10 MG tablet Commonly known as: DELTASONE Take by mouth.        Allergies:  Allergies  Allergen Reactions   Epirubicin Other (See Comments) and Itching    Skin toxicity  hands started peeling and turning real red (01/25/2012)  Skin toxicity, hands started peeling and turning real red (01/25/2012)   Latex Other (See Comments)    Blisters   Methocarbamol Shortness Of Breath   Penicillins Other (See Comments), Rash and Itching    turned real real red; took 1 pill and ended up in hospital for 2 wks; in the 1980's (01/25/2012)  Did it involve swelling of the face/tongue/throat, SOB, or low BP? No  Did it involve sudden or severe rash/hives, skin peeling, or any reaction on the inside of your mouth or nose? Yes  Did you need to seek medical attention at a hospital or doctor's office? Yes  When did it last happen?1980s        If all above answers are NO, may proceed with cephalosporin use.  Tolerated Meropenem  and Invanz  02/2022  turned real real red; took 1 pill and  ended up in hospital for 2 wks; in the 1980's (01/25/2012), Did it involve swelling of the face/tongue/throat, SOB, or low BP? No, Did it involve sudden or severe rash/hives, skin peeling, or any reaction on the inside of your mouth or nose? Yes, Did you need to seek medical attention at a hospital or doctor's office? Yes, When did it last happen?1980s      , If all above answers are "NO", may proceed with cephalosporin use.   Peg 3350 -Kcl-Nabcb-Nacl-Nasulf     NAUSEA/VOMITING, WEAKNESS  Other Reaction(s): GI  Intolerance    Family History: Family History  Problem Relation Age of Onset   Diabetes Mother    Hypertension Mother    Heart disease Father    Hypertension Father    Diabetes Son    Diabetes Sister    Thyroid disease Sister    Cancer Sister        lung and brain   Diabetes Sister    Diabetes Sister    Breast cancer Sister 59   Diabetes Sister    COPD Brother    Hypertension Brother    Breast cancer Maternal Aunt 48   Stomach cancer Maternal Uncle    Cancer Other 35       possible uterine cancer   Colon cancer Neg Hx     Social History:  reports that she quit smoking about 18 years ago. Her smoking use included cigarettes. She started smoking about 51 years ago. She has a 49.5 pack-year smoking history. She has never used smokeless tobacco. She reports that she does not drink alcohol and does not use drugs.  ROS: All other review of systems were reviewed and are negative except what is noted above in HPI  Physical Exam: BP (!) 148/92   Pulse (!) 123   Constitutional:  Alert and oriented, No acute distress. HEENT: [place] AT, moist mucus membranes.  Trachea midline, no masses. Cardiovascular: No clubbing, cyanosis, or edema. Respiratory: Normal respiratory effort, no increased work of breathing. GI: Abdomen is soft, nontender, nondistended, no abdominal masses GU: No CVA tenderness.  Lymph: No cervical or inguinal lymphadenopathy. Skin: No rashes, bruises or suspicious  lesions. Neurologic: Grossly intact, no focal deficits, moving all 4 extremities. Psychiatric: Normal mood and affect.  Laboratory Data: Lab Results  Component Value Date   WBC 10.3 07/07/2023   HGB 12.4 07/07/2023   HCT 39.3 07/07/2023   MCV 88.9 07/07/2023   PLT 344 07/07/2023    Lab Results  Component Value Date   CREATININE 0.64 07/07/2023    No results found for: PSA  No results found for: TESTOSTERONE  No results found for: HGBA1C  Urinalysis    Component Value Date/Time   COLORURINE YELLOW 02/15/2022 2122   APPEARANCEUR CLEAR 02/15/2022 2122   LABSPEC 1.017 02/15/2022 2122   PHURINE 6.0 02/15/2022 2122   GLUCOSEU NEGATIVE 02/15/2022 2122   HGBUR NEGATIVE 02/15/2022 2122   BILIRUBINUR NEGATIVE 02/15/2022 2122   KETONESUR NEGATIVE 02/15/2022 2122   PROTEINUR NEGATIVE 02/15/2022 2122   UROBILINOGEN 0.2 01/20/2012 1020   NITRITE NEGATIVE 02/15/2022 2122   LEUKOCYTESUR TRACE (A) 02/15/2022 2122    Lab Results  Component Value Date   BACTERIA NONE SEEN 02/15/2022    Pertinent Imaging: Ct 05/07/2023: Images reviewed and discussed with the patient  No results found for this or any previous visit.  No results found for this or any previous visit.  No results found for this or any previous visit.  No results found for this or any previous visit.  No results found for this or any previous visit.  No results found for this or any previous visit.  No results found for this or any previous visit.  No results found for this or any previous visit.   Cystoscopy Procedure Note  Patient identification was confirmed, informed consent was obtained, and patient was prepped using Betadine  solution.  Lidocaine  jelly was administered per urethral meatus.    Procedure: - Flexible cystoscope introduced, without any difficulty.   - Thorough search of the bladder revealed:  normal urethral meatus    normal urothelium    no stones    no ulcers     no  tumors    no urethral polyps    no trabeculation  - Ureteral orifices were normal in position and appearance.  Post-Procedure: - Patient tolerated the procedure well    Assessment & Plan:    1. Bladder wall thickening (Primary) -Cystoscopy showed no mass. We will obtain a urine cytology and if positive we will proceed with cystoscopy with selective cytologies in the OR. If negative she can followup PRN   No follow-ups on file.  Belvie Clara, MD  [facility] [place]

## 2023-08-12 LAB — URINALYSIS, ROUTINE W REFLEX MICROSCOPIC
Bilirubin, UA: NEGATIVE
Ketones, UA: NEGATIVE
Leukocytes,UA: NEGATIVE
Nitrite, UA: NEGATIVE
Protein,UA: NEGATIVE
RBC, UA: NEGATIVE
Specific Gravity, UA: 1.01 (ref 1.005–1.030)
Urobilinogen, Ur: 0.2 mg/dL (ref 0.2–1.0)
pH, UA: 6.5 (ref 5.0–7.5)

## 2023-08-12 LAB — CYTOLOGY, URINE

## 2023-08-16 ENCOUNTER — Encounter: Payer: Self-pay | Admitting: Urology

## 2023-08-16 NOTE — Patient Instructions (Signed)

## 2023-08-17 ENCOUNTER — Ambulatory Visit: Payer: Self-pay | Admitting: Urology

## 2023-09-23 ENCOUNTER — Encounter: Payer: Self-pay | Admitting: Oncology

## 2023-09-26 ENCOUNTER — Encounter: Payer: Self-pay | Admitting: Oncology

## 2023-09-30 ENCOUNTER — Inpatient Hospital Stay: Payer: Self-pay

## 2023-10-05 ENCOUNTER — Inpatient Hospital Stay: Attending: Oncology

## 2023-10-05 DIAGNOSIS — E611 Iron deficiency: Secondary | ICD-10-CM | POA: Diagnosis present

## 2023-10-05 DIAGNOSIS — Z853 Personal history of malignant neoplasm of breast: Secondary | ICD-10-CM | POA: Insufficient documentation

## 2023-10-05 DIAGNOSIS — D72829 Elevated white blood cell count, unspecified: Secondary | ICD-10-CM | POA: Insufficient documentation

## 2023-10-05 LAB — CBC WITH DIFFERENTIAL/PLATELET
Abs Immature Granulocytes: 0.04 K/uL (ref 0.00–0.07)
Basophils Absolute: 0.1 K/uL (ref 0.0–0.1)
Basophils Relative: 1 %
Eosinophils Absolute: 0.5 K/uL (ref 0.0–0.5)
Eosinophils Relative: 4 %
HCT: 39.8 % (ref 36.0–46.0)
Hemoglobin: 13 g/dL (ref 12.0–15.0)
Immature Granulocytes: 0 %
Lymphocytes Relative: 26 %
Lymphs Abs: 2.9 K/uL (ref 0.7–4.0)
MCH: 30.2 pg (ref 26.0–34.0)
MCHC: 32.7 g/dL (ref 30.0–36.0)
MCV: 92.6 fL (ref 80.0–100.0)
Monocytes Absolute: 0.7 K/uL (ref 0.1–1.0)
Monocytes Relative: 6 %
Neutro Abs: 6.8 K/uL (ref 1.7–7.7)
Neutrophils Relative %: 63 %
Platelets: 325 K/uL (ref 150–400)
RBC: 4.3 MIL/uL (ref 3.87–5.11)
RDW: 13.2 % (ref 11.5–15.5)
WBC: 11 K/uL — ABNORMAL HIGH (ref 4.0–10.5)
nRBC: 0 % (ref 0.0–0.2)

## 2023-10-05 LAB — COMPREHENSIVE METABOLIC PANEL WITH GFR
ALT: 18 U/L (ref 0–44)
AST: 22 U/L (ref 15–41)
Albumin: 3.7 g/dL (ref 3.5–5.0)
Alkaline Phosphatase: 93 U/L (ref 38–126)
Anion gap: 15 (ref 5–15)
BUN: 20 mg/dL (ref 8–23)
CO2: 25 mmol/L (ref 22–32)
Calcium: 9.3 mg/dL (ref 8.9–10.3)
Chloride: 98 mmol/L (ref 98–111)
Creatinine, Ser: 0.73 mg/dL (ref 0.44–1.00)
GFR, Estimated: >60 mL/min (ref >60)
Glucose, Bld: 150 mg/dL — ABNORMAL HIGH (ref 70–99)
Potassium: 3.8 mmol/L (ref 3.5–5.1)
Sodium: 138 mmol/L (ref 135–145)
Total Bilirubin: 0.5 mg/dL (ref 0.0–1.2)
Total Protein: 7.2 g/dL (ref 6.5–8.1)

## 2023-10-05 LAB — FERRITIN: Ferritin: 229 ng/mL (ref 11–307)

## 2023-10-05 LAB — IRON AND TIBC
Iron: 42 ug/dL (ref 28–170)
Saturation Ratios: 14 % (ref 10.4–31.8)
TIBC: 310 ug/dL (ref 250–450)
UIBC: 268 ug/dL

## 2023-10-05 LAB — VITAMIN B12: Vitamin B-12: 1510 pg/mL — ABNORMAL HIGH (ref 180–914)

## 2023-10-05 LAB — FOLATE: Folate: 19.3 ng/mL (ref >5.9)

## 2023-10-07 ENCOUNTER — Other Ambulatory Visit

## 2023-10-14 ENCOUNTER — Inpatient Hospital Stay: Payer: Self-pay | Admitting: Oncology

## 2023-10-14 ENCOUNTER — Other Ambulatory Visit (HOSPITAL_COMMUNITY): Payer: Self-pay | Admitting: Adult Health

## 2023-10-14 VITALS — BP 136/44 | HR 98 | Temp 98.1°F | Resp 20 | Wt 169.2 lb

## 2023-10-14 DIAGNOSIS — D72829 Elevated white blood cell count, unspecified: Secondary | ICD-10-CM

## 2023-10-14 DIAGNOSIS — Z1231 Encounter for screening mammogram for malignant neoplasm of breast: Secondary | ICD-10-CM

## 2023-10-14 DIAGNOSIS — C50919 Malignant neoplasm of unspecified site of unspecified female breast: Secondary | ICD-10-CM | POA: Diagnosis not present

## 2023-10-14 DIAGNOSIS — D75839 Thrombocytosis, unspecified: Secondary | ICD-10-CM

## 2023-10-14 DIAGNOSIS — E611 Iron deficiency: Secondary | ICD-10-CM | POA: Diagnosis not present

## 2023-10-14 DIAGNOSIS — Z171 Estrogen receptor negative status [ER-]: Secondary | ICD-10-CM

## 2023-10-14 NOTE — Progress Notes (Signed)
 [facility] at [facility]  HEMATOLOGY FOLLOW-UP VISIT  Claudia Satterfield, MD  REASON FOR FOLLOW-UP: Leukocytosis  ASSESSMENT & PLAN:  Patient is a 68 y.o. female following for leukocytosis  Assessment & Plan Iron  deficiency Likely secondary to chronic blood loss and probably component of chronic inflammation as well Last colonoscopy/endoscopy: 10/2018. They recommended follow-up in 10 years S/p IV iron  with significant improvement  -Continue oral iron  every other day.  Use MiraLAX for constipation  Return to clinic in 6 months with labs.  Will discharge from clinic at that time if labs are stable. Leukocytosis, unspecified type Mild leukocytosis with WBC ~12 for 3 months.  Likely secondary to chronic inflammation (back pain) or diverticulosis.   No significant B symptoms.  ESR, CRP: Elevated Flow cytometry: Negative for leukemia/lymphoma  - Continue to monitor blood counts Malignant neoplasm of breast in female, estrogen receptor negative, unspecified laterality, unspecified site of breast [facility]) Patient has a history of triple negative breast cancer diagnosed in 2007 s/p chemotherapy with 6 cycles of FEC and mastectomy.   Was on breast cancer survivorship for 10 years.   Currently getting yearly mammograms.  -No evidence of recurrence of disease at this time. Thrombocytosis Likely secondary to iron  deficiency.  Resolved at this time.    Orders Placed This Encounter  Procedures   CBC with Differential    Standing Status:   Future    Expected Date:   04/10/2024    Expiration Date:   07/09/2024   Comprehensive metabolic panel    Standing Status:   Future    Expected Date:   04/10/2024    Expiration Date:   07/09/2024   Iron  and TIBC ([facility] ONLY)    Standing Status:   Future    Expected Date:   04/10/2024    Expiration Date:   07/09/2024   Ferritin    Standing Status:   Future    Expected Date:   04/10/2024    Expiration Date:    07/09/2024   Vitamin B12    Standing Status:   Future    Expected Date:   04/10/2024    Expiration Date:   07/09/2024   Folate    Standing Status:   Future    Expected Date:   04/10/2024    Expiration Date:   07/09/2024    The total time spent in the appointment was 20 minutes encounter with patients including review of chart and various tests results, discussions about plan of care and coordination of care plan   All questions were answered. The patient knows to call the clinic with any problems, questions or concerns. No barriers to learning was detected.   Claudia Murphy,acting as a Neurosurgeon for Mickiel Dry, MD.,have documented all relevant documentation on the behalf of Mickiel Dry, MD,as directed by  Mickiel Dry, MD while in the presence of Mickiel Dry, MD.  I, Mickiel Dry MD, have reviewed the above documentation for accuracy and completeness, and I agree with the above.     Mickiel Dry, MD 8/28/20253:26 PM   SUMMARY OF HEMATOLOGIC/ONCOLOGY HISTORY: Leukocytosis: Likely secondary to diverticulosis or back pain ( chronic inflammation) -ESR,CRP: elevated -LDH: Normal -Flow cytometry: Negative - Resolved at this time  Breast cancer: - Diagnosed 2006 -S/P chemotherapy with 5-fluorouracil, epirubicin and cyclophosphamide for 6 cycles in a dose dense fashion but had methotrexate as a substitute for epirubicin for the 6th cycle.   -She had a mastectomy and finished all of chemotherapy  as of 06/03/2005.   -Since then patient has been on surveillance and was discharged after the 10-year mark.   Thrombocytosis:Likely secondary to Iron  deficiency and inflammation  -S/p IV Venofer  500 mg X2 doses on 06/04/2023  and 06/22/2023  - Resolved at this time   INTERVAL HISTORY: Claudia Murphy 68 y.o. female following for leukocytosis.  She has no complaints today.  Denies fever, chills, weight loss, loss of appetite, fatigue, night sweats.  She has continued to take oral  iron  every other day and has no side effects from it.  I have reviewed the past medical history, past surgical history, social history and family history with the patient   ALLERGIES:  is allergic to epirubicin, latex, methocarbamol, penicillins, and peg 3350 -[id number]-nacl-nasulf.  MEDICATIONS:  Current Outpatient Medications  Medication Sig Dispense Refill   aspirin EC 81 MG tablet Take 81 mg by mouth daily.     calcium carbonate (OS-CAL - DOSED IN MG OF ELEMENTAL CALCIUM) 1250 (500 Ca) MG tablet Take 2 tablets by mouth 2 (two) times daily.      celecoxib (CELEBREX) 200 MG capsule Take 200 mg by mouth daily.     cyclobenzaprine (FLEXERIL) 10 MG tablet Take 10 mg by mouth 3 (three) times daily.     ferrous sulfate  325 (65 FE) MG EC tablet Take 1 tablet (325 mg total) by mouth every other day. 45 tablet 3   losartan  (COZAAR ) 25 MG tablet TAKE 1 TABLET BY MOUTH EVERY DAY 30 tablet PRN   predniSONE (DELTASONE) 10 MG tablet Take by mouth.     No current facility-administered medications for this visit.     REVIEW OF SYSTEMS:   Constitutional: Denies fevers, chills or night sweats Eyes: Denies blurriness of vision Ears, nose, mouth, throat, and face: Denies mucositis or sore throat Respiratory: Denies cough, dyspnea or wheezes Cardiovascular: Denies palpitation, chest discomfort or lower extremity swelling Gastrointestinal:  Denies nausea, heartburn or change in bowel habits Skin: Denies abnormal skin rashes Lymphatics: Denies new lymphadenopathy or easy bruising Neurological:Denies numbness, tingling or new weaknesses Behavioral/Psych: Mood is stable, no new changes  All other systems were reviewed with the patient and are negative.  PHYSICAL EXAMINATION:   Vitals:   10/14/23 1457  BP: (!) 136/44  Pulse: 98  Resp: 20  Temp: 98.1 F (36.7 C)  SpO2: 99%   GENERAL:alert, no distress and comfortable SKIN: skin color, texture, turgor are normal, no rashes or significant  lesions LYMPH:  no palpable lymphadenopathy in the cervical, axillary or inguinal LUNGS: clear to auscultation and percussion with normal breathing effort HEART: regular rate & rhythm and no murmurs and no lower extremity edema ABDOMEN:abdomen soft, non-tender and normal bowel sounds Musculoskeletal:no cyanosis of digits and no clubbing  NEURO: alert & oriented x 3 with fluent speech  LABORATORY DATA:  I have reviewed the data as listed  Lab Results  Component Value Date   WBC 11.0 (H) 10/05/2023   NEUTROABS 6.8 10/05/2023   HGB 13.0 10/05/2023   HCT 39.8 10/05/2023   MCV 92.6 10/05/2023   PLT 325 10/05/2023       Chemistry      Component Value Date/Time   NA 138 10/05/2023 1512   K 3.8 10/05/2023 1512   CL 98 10/05/2023 1512   CO2 25 10/05/2023 1512   BUN 20 10/05/2023 1512   CREATININE 0.73 10/05/2023 1512   CREATININE 0.61 09/28/2012 1155      Component Value Date/Time   CALCIUM 9.3 10/05/2023  1512   ALKPHOS 93 10/05/2023 1512   AST 22 10/05/2023 1512   ALT 18 10/05/2023 1512   BILITOT 0.5 10/05/2023 1512      Latest Reference Range & Units 05/13/23 13:58  Sed Rate 0 - 22 mm/hr 32 (H)  (H): Data is abnormally high  Latest Reference Range & Units 05/13/23 13:58  CRP <1.0 mg/dL 1.5 (H)  (H): Data is abnormally high   Latest Reference Range & Units 10/05/23 15:12  Iron  28 - 170 ug/dL 42  UIBC ug/dL 731  TIBC 749 - 549 ug/dL 689  Saturation Ratios 10.4 - 31.8 % 14  Ferritin 11 - 307 ng/mL 229  Folate >5.9 ng/mL 19.3  Vitamin B12 180 - 914 pg/mL 1,510 (H)  (H): Data is abnormally high    Latest Reference Range & Units 05/13/23 13:58  LDH 98 - 192 U/L 107   Flow cytometry: 05/13/23  Peripheral blood, flow cytometry:  -  No immunophenotypic evidence of a lymphoproliferative disorder (i.e. no monoclonal B cells or immunophenotypically abnormal T cells detected).   GATING AND PHENOTYPIC ANALYSIS:   Gated population: Flow cytometric immunophenotyping is  performed using antibodies to the antigens listed in the table below. Electronic gates are placed around a cell cluster displaying light scatter properties corresponding to: lymphocytes   Abnormal Cells in gated population: N/A   Phenotype of Abnormal Cells: N/A    Colonoscopy: 10/2018: Impression:  - Diverticulosis in the recto- sigmoid colon, in the sigmoid colon and in the descending colon. - External and internal hemorrhoids.  - Tortuous colon.

## 2023-10-14 NOTE — Assessment & Plan Note (Addendum)
 Mild leukocytosis with WBC ~12 for 3 months.  Likely secondary to chronic inflammation (back pain) or diverticulosis.   No significant B symptoms.  ESR, CRP: Elevated Flow cytometry: Negative for leukemia/lymphoma  - Continue to monitor blood counts

## 2023-10-14 NOTE — Assessment & Plan Note (Addendum)
 Patient has a history of triple negative breast cancer diagnosed in 2007 s/p chemotherapy with 6 cycles of FEC and mastectomy.  Was on breast cancer survivorship for 10 years.  Currently getting yearly mammograms. -No evidence of recurrence of disease at this time.

## 2023-10-14 NOTE — Assessment & Plan Note (Addendum)
 Likely secondary to iron  deficiency.  Resolved at this time.

## 2023-10-14 NOTE — Assessment & Plan Note (Addendum)
 Likely secondary to chronic blood loss and probably component of chronic inflammation as well Last colonoscopy/endoscopy: 10/2018. They recommended follow-up in 10 years S/p IV iron  with significant improvement  -Continue oral iron  every other day.  Use MiraLAX for constipation  Return to clinic in 6 months with labs.  Will discharge from clinic at that time if labs are stable.

## 2023-10-14 NOTE — Patient Instructions (Signed)
 [facility] at [facility] Discharge Instructions   You were seen and examined today by Dr. Davonna.  She reviewed the results of your lab work which are normal/stable.   We will see you back in 6 months. We will repeat lab work prior to this visit.   Return as scheduled.    Thank you for choosing  [facility] Center at [facility] to provide your oncology and hematology care.  To afford each patient quality time with our provider, please arrive at least 15 minutes before your scheduled appointment time.   If you have a lab appointment with the Cancer Center please come in thru the Main Entrance and check in at the main information desk.  You need to re-schedule your appointment should you arrive 10 or more minutes late.  We strive to give you quality time with our providers, and arriving late affects you and other patients whose appointments are after yours.  Also, if you no show three or more times for appointments you may be dismissed from the clinic at the providers discretion.     Again, thank you for choosing [facility]  Our hope is that these requests will decrease the amount of time that you wait before being seen by our physicians.       _____________________________________________________________  Should you have questions after your visit to [facility], please contact our office at [phone] and follow the prompts.  Our office hours are 8:00 a.m. and 4:30 p.m. Monday - Friday.  Please note that voicemails left after 4:00 p.m. may not be returned until the following business day.  We are closed weekends and major holidays.  You do have access to a nurse 24-7, just call the main number to the clinic [phone] and do not press any options, hold on the line and a nurse will answer the phone.    For prescription refill requests, have your pharmacy contact our office and allow 72 hours.    Due to Covid, you will  need to wear a mask upon entering the hospital. If you do not have a mask, a mask will be given to you at the Main Entrance upon arrival. For doctor visits, patients may have 1 support person age 4 or older with them. For treatment visits, patients can not have anyone with them due to social distancing guidelines and our immunocompromised population.

## 2023-11-08 ENCOUNTER — Encounter (HOSPITAL_COMMUNITY): Payer: Self-pay

## 2023-11-08 ENCOUNTER — Ambulatory Visit (HOSPITAL_COMMUNITY)
Admission: RE | Admit: 2023-11-08 | Discharge: 2023-11-08 | Disposition: A | Discharge status: Home / Self Care | Source: Ambulatory Visit | Attending: Adult Health | Admitting: Adult Health

## 2023-11-08 DIAGNOSIS — Z1231 Encounter for screening mammogram for malignant neoplasm of breast: Secondary | ICD-10-CM | POA: Diagnosis present

## 2023-11-10 ENCOUNTER — Ambulatory Visit: Payer: Self-pay | Admitting: Adult Health

## 2024-04-13 ENCOUNTER — Inpatient Hospital Stay

## 2024-04-20 ENCOUNTER — Ambulatory Visit: Admitting: Oncology

## 2024-04-20 ENCOUNTER — Inpatient Hospital Stay: Admitting: Oncology
